# Patient Record
Sex: Male | Born: 1940 | Race: White | Hispanic: No | Marital: Married | Smoking: Former smoker
Health system: Southern US, Community
[De-identification: ages and names within clinical notes are randomized; demographics above are authoritative.]

## PROBLEM LIST (undated history)

## (undated) DIAGNOSIS — I5189 Other ill-defined heart diseases: Secondary | ICD-10-CM

## (undated) DIAGNOSIS — B269 Mumps without complication: Secondary | ICD-10-CM

## (undated) DIAGNOSIS — E559 Vitamin D deficiency, unspecified: Secondary | ICD-10-CM

## (undated) DIAGNOSIS — J189 Pneumonia, unspecified organism: Secondary | ICD-10-CM

## (undated) DIAGNOSIS — I2510 Atherosclerotic heart disease of native coronary artery without angina pectoris: Secondary | ICD-10-CM

## (undated) DIAGNOSIS — J9610 Chronic respiratory failure, unspecified whether with hypoxia or hypercapnia: Secondary | ICD-10-CM

## (undated) DIAGNOSIS — B019 Varicella without complication: Secondary | ICD-10-CM

## (undated) DIAGNOSIS — J309 Allergic rhinitis, unspecified: Secondary | ICD-10-CM

## (undated) DIAGNOSIS — N400 Enlarged prostate without lower urinary tract symptoms: Secondary | ICD-10-CM

## (undated) DIAGNOSIS — B059 Measles without complication: Secondary | ICD-10-CM

## (undated) DIAGNOSIS — J449 Chronic obstructive pulmonary disease, unspecified: Secondary | ICD-10-CM

## (undated) HISTORY — DX: Allergic rhinitis, unspecified: J30.9

## (undated) HISTORY — DX: Vitamin D deficiency, unspecified: E55.9

## (undated) HISTORY — PX: EYE SURGERY: SHX253

## (undated) HISTORY — DX: Varicella without complication: B01.9

## (undated) HISTORY — PX: APPENDECTOMY: SHX54

## (undated) HISTORY — DX: Measles without complication: B05.9

## (undated) HISTORY — DX: Mumps without complication: B26.9

## (undated) HISTORY — PX: TONSILLECTOMY: SUR1361

---

## 2000-10-06 ENCOUNTER — Emergency Department (HOSPITAL_COMMUNITY): Admission: EM | Admit: 2000-10-06 | Discharge: 2000-10-06 | Payer: Self-pay | Admitting: Emergency Medicine

## 2002-03-16 ENCOUNTER — Emergency Department (HOSPITAL_COMMUNITY): Admission: EM | Admit: 2002-03-16 | Discharge: 2002-03-17 | Payer: Self-pay | Admitting: Emergency Medicine

## 2006-04-11 ENCOUNTER — Other Ambulatory Visit: Payer: Self-pay

## 2006-04-11 ENCOUNTER — Emergency Department: Payer: Self-pay | Admitting: Internal Medicine

## 2006-09-14 ENCOUNTER — Other Ambulatory Visit: Payer: Self-pay

## 2006-09-14 ENCOUNTER — Inpatient Hospital Stay: Payer: Self-pay | Admitting: Internal Medicine

## 2008-09-18 ENCOUNTER — Inpatient Hospital Stay: Payer: Self-pay | Admitting: Internal Medicine

## 2011-01-13 ENCOUNTER — Ambulatory Visit: Payer: Self-pay | Admitting: Specialist

## 2011-02-04 ENCOUNTER — Ambulatory Visit: Payer: Self-pay | Admitting: Specialist

## 2012-04-18 DIAGNOSIS — N401 Enlarged prostate with lower urinary tract symptoms: Secondary | ICD-10-CM | POA: Insufficient documentation

## 2013-11-07 ENCOUNTER — Ambulatory Visit: Payer: Self-pay | Admitting: Specialist

## 2014-03-09 ENCOUNTER — Observation Stay: Payer: Self-pay | Admitting: Internal Medicine

## 2014-03-09 LAB — BASIC METABOLIC PANEL
ANION GAP: 3 — AB (ref 7–16)
BUN: 16 mg/dL (ref 7–18)
CHLORIDE: 99 mmol/L (ref 98–107)
CREATININE: 1 mg/dL (ref 0.60–1.30)
Calcium, Total: 8.9 mg/dL (ref 8.5–10.1)
Co2: 41 mmol/L (ref 21–32)
EGFR (African American): >60
EGFR (Non-African Amer.): >60
GLUCOSE: 99 mg/dL (ref 65–99)
Osmolality: 286 (ref 275–301)
Potassium: 4.5 mmol/L (ref 3.5–5.1)
Sodium: 143 mmol/L (ref 136–145)

## 2014-03-09 LAB — CBC
HCT: 41.4 % (ref 40.0–52.0)
HGB: 13.1 g/dL (ref 13.0–18.0)
MCH: 29.2 pg (ref 26.0–34.0)
MCHC: 31.7 g/dL — AB (ref 32.0–36.0)
MCV: 92 fL (ref 80–100)
PLATELETS: 160 10*3/uL (ref 150–440)
RBC: 4.49 10*6/uL (ref 4.40–5.90)
RDW: 15.9 % — ABNORMAL HIGH (ref 11.5–14.5)
WBC: 8.9 10*3/uL (ref 3.8–10.6)

## 2014-03-09 LAB — TROPONIN I: TROPONIN-I: 0.02 ng/mL

## 2014-03-10 LAB — URINALYSIS, COMPLETE
BILIRUBIN, UR: NEGATIVE
Bacteria: NONE SEEN
Blood: NEGATIVE
Glucose,UR: NEGATIVE mg/dL (ref 0–75)
Leukocyte Esterase: NEGATIVE
NITRITE: NEGATIVE
Ph: 6 (ref 4.5–8.0)
RBC,UR: 2 /HPF (ref 0–5)
Specific Gravity: 1.024 (ref 1.003–1.030)
WBC UR: 6 /HPF (ref 0–5)

## 2014-03-24 ENCOUNTER — Emergency Department: Payer: Self-pay | Admitting: Emergency Medicine

## 2014-10-18 NOTE — Discharge Summary (Signed)
PATIENT NAME:  Keith Watts, Keith Watts MR#:  [id number] DATE OF BIRTH:  Jun 29, 1940  DATE OF ADMISSION:  03/09/2014 DATE OF DISCHARGE:  03/11/2014  ADMITTING PHYSICIAN: Keith Athensavid K. Hower, MD.   DISCHARGING PHYSICIAN: Keith Baasadhika Selinda Korzeniewski, MD.   PRIMARY CARE PHYSICIAN: Keith IsaacsDonald E. Sherrie MustacheFisher, MD.   CONSULTATIONS IN HOSPITAL: None.   DISCHARGE DIAGNOSES: 1.  Recent falls.  2.  Left knee pain, likely tendinitis.  3.  Chronic obstructive pulmonary disease exacerbation with bronchitis.  4.  Chronic obstructive pulmonary disease on 3 liters home oxygen.  5.  Vitamin D deficiency.   DISCHARGE HOME MEDICATIONS:  1.  Spiriva inhalation capsule 2 daily.  2.  Advair 250/50 one puff b.i.d.  3.  Doxycycline 100 mg p.o. b.i.d.  4.  Prednisone taper over 6 days.  5.  Mucinex 600 mg p.o. b.i.d.  6.  Tramadol 50 mg p.o. every 6 hours p.r.n. for left knee pain.  7.  Vitamin D2 at 50,000 international units every week for 12 weeks.   DISCHARGE HOME OXYGEN: 3 liters.   DISCHARGE ACTIVITY: As tolerated.   DISCHARGE DIET: Regular diet.   FOLLOWUP INSTRUCTIONS: 1.  PCP followup in 1 to 2 weeks.  2.  Apply ice pack to left knee for pain; apply for 15 to 20 minutes each time at least 3 to 4 times a day. If does not improve in 1 to 2 weeks, discuss with PCP with an MRI of the knee.  3.  Home health physical therapy and nursing.   LABORATORY DATA PRIOR TO DISCHARGE: Sodium 143, potassium 4.5, chloride 99, bicarbonate 41, BUN 16, creatinine 1, glucose 99, calcium 8.9. WBC 8.9, hemoglobin 13.1, hematocrit 41.4, platelet count 160,000. Vitamin D level is extremely low at 8.7. Left knee x-ray showing evidence of acute abnormality. Chest x-ray showing severe chronic interstitial obstructive lung disease. No acute abnormality. CT of the head showing no acute intracranial abnormality, progressive chronic small vessel ischemic changes primarily in right hemisphere. Urinalysis negative for any infection.    BRIEF HOSPITAL COURSE: [name] is a 74 year old, elderly Caucasian gentleman with past medical history significant for COPD admitted to the hospital secondary to recent fall.  1.  Fall and weakness, likely secondary to vitamin D deficiency and overall weakness. Evaluated by physical therapy. Vitamin D level is extremely low. He will be supplemented with 50,000 units weekly for 12 weeks and follow with PCP. Physical therapy recommended home health.  2.  Left knee pain, likely tendinitis, recently been on prednisone a few weeks ago. Has significant pain of the knee. X-ray did not show any fracture. Pain is along the posterolateral tendon around the knee. Improved over the hospital, so advised ice pack and tramadol p.r.n. for pain. If pain worsens, might need an MRI as an outpatient.  3.  Chronic obstructive pulmonary disease with bronchitis. Given steroids in the hospital, being discharged on p.o. prednisone and also doxycycline. Inhalers were also given at the time of discharge.  4.  His course has been otherwise uneventful in the hospital.   DISCHARGE CONDITION: Stable.   DISCHARGE DISPOSITION: Home with home health.   TIME SPENT ON DISCHARGE: 40 minutes.     ____________________________ Keith Baasadhika Elio Haden, MD rk:at D: 03/11/2014 13:16:14 ET T: 03/11/2014 14:40:08 ET JOB#: [id number]  cc: Keith Baasadhika Eduin Friedel, MD, <Dictator> Keith Isaacsonald E. Sherrie MustacheFisher, MD Keith BaasADHIKA Shacoya Burkhammer MD ELECTRONICALLY SIGNED 03/27/2014 9:49

## 2014-10-18 NOTE — H&P (Signed)
PATIENT NAME:  Keith Watts, Keith Watts MR#:  [id number] DATE OF BIRTH:  31-Jan-1941  DATE OF ADMISSION:  03/09/2014  REFERRING PHYSICIAN: Loraine Leriche R. Fanny Bien, MD  PRIMARY CARE PHYSICIAN: Demetrios Isaacs. Sherrie Mustache, MD  CHIEF COMPLAINT: Falls.   HISTORY OF PRESENT ILLNESS: A 74 year old Caucasian male with a history of COPD on 2 L nasal cannula at baseline presenting with multiple falls. Recent diagnosis of pneumonia by PCP, had already finished antibiotics and steroids about 1 week ago, presenting with 3-4 falls within the last week duration without associated loss of consciousness. He did have 1 episode of head trauma when he fell and hit the side of the bathtub; however, once again, no loss of consciousness. Describes experiencing multiple falls over the past few days. One fall was mechanical in etiology; however, he is unsure why he fell the other 2-3 times. His wife at bedside states that whenever she attempts to have him ambulate, he fell backwards secondary to generalized weakness. No further symptomatology.  REVIEW OF SYSTEMS:  CONSTITUTIONAL: Denies fever. Positive for fatigue, weakness.  EYES: Denies blurred vision, double vision, eye pain. ENT: Denies tinnitus, ear pain, hearing loss.  RESPIRATORY: Denies cough, wheeze, shortness of breath.  CARDIOVASCULAR: Denies chest pain, palpitations, edema.  GASTROINTESTINAL: Denies nausea, vomiting, diarrhea, abdominal pain.  GENITOURINARY: Denies dysuria, hematuria.  ENDOCRINE: Denies nocturia or thyroid problems. HEMATOLOGY AND LYMPHATIC: Denies easy bruising and bleeding.  SKIN: Denies rashes or lesions.  MUSCULOSKELETAL: Denies pain in neck, back, shoulder, knees, hips, or arthritic symptoms.  NEUROLOGIC: Denies paralysis, paresthesias.  PSYCHIATRIC: Denies anxiety or depressive symptoms.  Otherwise, full review of systems performed by me is negative.   PAST MEDICAL HISTORY: COPD on 2 L nasal cannula at baseline.   SOCIAL HISTORY: Remote tobacco use. Denies  any alcohol or drug use.   FAMILY HISTORY: Positive for pancreatic cancer.   ALLERGIES: No known drug allergies.   HOME MEDICATIONS: Include Spiriva 18 mcg inhalation once daily, Advair 250/50 mcg b.i.d., oxygen 2 L nasal cannula at baseline.   PHYSICAL EXAMINATION:  VITAL SIGNS: Temperature 98.4, heart rate 84, respirations 16, blood pressure 146/77, . Weight 79.4 kg,  GENERAL: Weak-appearing Caucasian male currently in no acute distress.  HEAD: Normocephalic, atraumatic.  EYES: Pupils equal, round, reactive to light. Extraocular muscles intact. No scleral icterus. MOUTH: Moist mucous membranes. Dentition intact. No abscess noted. EARS, NOSE, THROAT: Clear without exudates. No external lesions.  NECK: Supple. No thyromegaly. No nodules. No JVD.  PULMONARY: Clear to auscultation bilaterally without wheezes, rales, or rhonchi. No use of accessory muscles. Good respiratory effort.  CHEST: Nontender to palpation.  CARDIOVASCULAR: S1, S2, regular rate and rhythm. No murmurs, rubs, or gallops. No edema. Pedal pulses 2+ bilaterally. GASTROINTESTINAL: Soft, nontender, nondistended. No masses. Positive bowel sounds. No hepatosplenomegaly.  MUSCULOSKELETAL: No swelling, clubbing, or edema. Range of motion full in all extremities.  NEUROLOGIC: Cranial nerves II through XII intact. No gross focal neurological deficits. Sensation intact. Reflexes intact. Strength 5/5 in all extremities, including proximal and distal flexion and extension.  SKIN: No ulceration, lesions, rash, cyanosis. Skin warm, dry. Turgor intact.  PSYCHIATRIC: Mood and affect within normal limits. Patient awake, alert, oriented x 3. Insight and judgment are intact.   LABORATORY DATA: Chest x-ray performed reveals stable COPD findings. No acute cardiopulmonary process. X-ray of the left knee: No abnormalities. CT, head, performed: No intracranial process. Remainder of laboratory data: Sodium 143, potassium 4.5, chloride 99, bicarbonate  41, BUN 16, creatinine 1,. WBC 8.9, hemoglobin 13.1, platelets  of 160,000.   ASSESSMENT AND PLAN: A 74 year old gentleman with a history of chronic obstructive pulmonary disease with 2 L nasal cannula at baseline presenting with multiple falls and general weakness. 1.  Weakness and falls, likely multifactorial. Get physical therapy evaluation. Will check a vitamin D level. Will also check a urinalysis, as this has yet to be performed.  2.  Chronic obstructive pulmonary disease. Stable. Continue with supplemental O2, Advair, and Spiriva. 3.  Venous thromboembolism prophylaxis with heparin subcutaneous.  CODE STATUS: Patient is full code.  TIME SPENT: 45 minutes.    ____________________________ Cletis Athensavid K. Gift Rueckert, MD dkh:ST D: 03/09/2014 22:46:55 ET T: 03/09/2014 23:18:51 ET JOB#: [id number]  cc: Cletis Athensavid K. Congetta Odriscoll, MD, <Dictator> Alexandrina Fiorini Synetta ShadowK Evren Shankland MD ELECTRONICALLY SIGNED 03/10/2014 20:51

## 2014-10-20 ENCOUNTER — Inpatient Hospital Stay
Admission: EM | Admit: 2014-10-20 | Discharge: 2014-10-26 | DRG: 871 | Disposition: A | Discharge status: Home / Self Care | Payer: PPO | Attending: Internal Medicine | Admitting: Internal Medicine

## 2014-10-20 DIAGNOSIS — Z79899 Other long term (current) drug therapy: Secondary | ICD-10-CM

## 2014-10-20 DIAGNOSIS — J9621 Acute and chronic respiratory failure with hypoxia: Secondary | ICD-10-CM | POA: Diagnosis present

## 2014-10-20 DIAGNOSIS — J9622 Acute and chronic respiratory failure with hypercapnia: Secondary | ICD-10-CM | POA: Diagnosis present

## 2014-10-20 DIAGNOSIS — R7989 Other specified abnormal findings of blood chemistry: Secondary | ICD-10-CM | POA: Diagnosis not present

## 2014-10-20 DIAGNOSIS — I480 Paroxysmal atrial fibrillation: Secondary | ICD-10-CM | POA: Diagnosis present

## 2014-10-20 DIAGNOSIS — J189 Pneumonia, unspecified organism: Secondary | ICD-10-CM | POA: Diagnosis present

## 2014-10-20 DIAGNOSIS — J449 Chronic obstructive pulmonary disease, unspecified: Secondary | ICD-10-CM | POA: Diagnosis present

## 2014-10-20 DIAGNOSIS — J441 Chronic obstructive pulmonary disease with (acute) exacerbation: Secondary | ICD-10-CM | POA: Diagnosis present

## 2014-10-20 DIAGNOSIS — I2510 Atherosclerotic heart disease of native coronary artery without angina pectoris: Secondary | ICD-10-CM | POA: Diagnosis present

## 2014-10-20 DIAGNOSIS — Z9981 Dependence on supplemental oxygen: Secondary | ICD-10-CM

## 2014-10-20 DIAGNOSIS — N179 Acute kidney failure, unspecified: Secondary | ICD-10-CM | POA: Diagnosis not present

## 2014-10-20 DIAGNOSIS — I509 Heart failure, unspecified: Secondary | ICD-10-CM | POA: Diagnosis not present

## 2014-10-20 DIAGNOSIS — G9341 Metabolic encephalopathy: Secondary | ICD-10-CM | POA: Diagnosis present

## 2014-10-20 DIAGNOSIS — J9811 Atelectasis: Secondary | ICD-10-CM | POA: Diagnosis present

## 2014-10-20 DIAGNOSIS — N170 Acute kidney failure with tubular necrosis: Secondary | ICD-10-CM | POA: Diagnosis present

## 2014-10-20 DIAGNOSIS — N400 Enlarged prostate without lower urinary tract symptoms: Secondary | ICD-10-CM | POA: Diagnosis present

## 2014-10-20 DIAGNOSIS — I340 Nonrheumatic mitral (valve) insufficiency: Secondary | ICD-10-CM | POA: Diagnosis not present

## 2014-10-20 DIAGNOSIS — Z7951 Long term (current) use of inhaled steroids: Secondary | ICD-10-CM

## 2014-10-20 DIAGNOSIS — A419 Sepsis, unspecified organism: Principal | ICD-10-CM | POA: Diagnosis present

## 2014-10-20 LAB — URINALYSIS, COMPLETE
BACTERIA: NONE SEEN
Bilirubin,UR: NEGATIVE
GLUCOSE, UR: NEGATIVE mg/dL (ref 0–75)
LEUKOCYTE ESTERASE: NEGATIVE
NITRITE: NEGATIVE
Ph: 5 (ref 4.5–8.0)
Protein: 100
Specific Gravity: 1.023 (ref 1.003–1.030)

## 2014-10-20 LAB — COMPREHENSIVE METABOLIC PANEL
ALBUMIN: 3.3 g/dL — AB
ALT: 29 U/L
Alkaline Phosphatase: 125 U/L
Anion Gap: 11 (ref 7–16)
BILIRUBIN TOTAL: 0.2 mg/dL — AB
BUN: 34 mg/dL — ABNORMAL HIGH
CHLORIDE: 103 mmol/L
CO2: 27 mmol/L
CREATININE: 1.65 mg/dL — AB
Calcium, Total: 8.5 mg/dL — ABNORMAL LOW
EGFR (African American): 47 — ABNORMAL LOW
GFR CALC NON AF AMER: 41 — AB
Glucose: 215 mg/dL — ABNORMAL HIGH
POTASSIUM: 4.5 mmol/L
SGOT(AST): 37 U/L
Sodium: 141 mmol/L
Total Protein: 7.8 g/dL

## 2014-10-20 LAB — APTT: Activated PTT: 37.1 secs — ABNORMAL HIGH (ref 23.6–35.9)

## 2014-10-20 LAB — CBC WITH DIFFERENTIAL/PLATELET
Basophil #: 0 10*3/uL (ref 0.0–0.1)
Basophil %: 0.2 %
Eosinophil #: 0 10*3/uL (ref 0.0–0.7)
Eosinophil %: 0.1 %
HCT: 39.7 % — AB (ref 40.0–52.0)
HGB: 12.6 g/dL — AB (ref 13.0–18.0)
Lymphocyte #: 0.7 10*3/uL — ABNORMAL LOW (ref 1.0–3.6)
Lymphocyte %: 5.6 %
MCH: 29 pg (ref 26.0–34.0)
MCHC: 31.8 g/dL — ABNORMAL LOW (ref 32.0–36.0)
MCV: 91 fL (ref 80–100)
MONO ABS: 1.1 x10 3/mm — AB (ref 0.2–1.0)
MONOS PCT: 9 %
Neutrophil #: 10.7 10*3/uL — ABNORMAL HIGH (ref 1.4–6.5)
Neutrophil %: 85.1 %
Platelet: 196 10*3/uL (ref 150–440)
RBC: 4.35 10*6/uL — ABNORMAL LOW (ref 4.40–5.90)
RDW: 14.5 % (ref 11.5–14.5)
WBC: 12.6 10*3/uL — ABNORMAL HIGH (ref 3.8–10.6)

## 2014-10-20 LAB — TROPONIN I: Troponin-I: 0.11 ng/mL — ABNORMAL HIGH

## 2014-10-20 LAB — LACTIC ACID, PLASMA: Lactic Acid, Venous: 2.6 mmol/L

## 2014-10-20 LAB — PROTIME-INR
INR: 1.2
Prothrombin Time: 15.1 secs — ABNORMAL HIGH

## 2014-10-21 ENCOUNTER — Other Ambulatory Visit: Payer: Self-pay | Admitting: Physician Assistant

## 2014-10-21 DIAGNOSIS — J9621 Acute and chronic respiratory failure with hypoxia: Secondary | ICD-10-CM

## 2014-10-21 DIAGNOSIS — R7989 Other specified abnormal findings of blood chemistry: Secondary | ICD-10-CM

## 2014-10-21 DIAGNOSIS — I340 Nonrheumatic mitral (valve) insufficiency: Secondary | ICD-10-CM

## 2014-10-21 DIAGNOSIS — A419 Sepsis, unspecified organism: Secondary | ICD-10-CM

## 2014-10-21 DIAGNOSIS — N179 Acute kidney failure, unspecified: Secondary | ICD-10-CM

## 2014-10-21 LAB — CK TOTAL AND CKMB (NOT AT ARMC)
CK, Total: 45 U/L — ABNORMAL LOW
CK, Total: 49 U/L
CK-MB: 3.8 ng/mL
CK-MB: 4.2 ng/mL
CK-MB: 4.6 ng/mL

## 2014-10-21 LAB — BASIC METABOLIC PANEL
Anion Gap: 12 (ref 7–16)
Anion Gap: 8 (ref 7–16)
BUN: 31 mg/dL — ABNORMAL HIGH
BUN: 29 mg/dL — AB
CALCIUM: 7.6 mg/dL — AB
CREATININE: 1.4 mg/dL — AB
Calcium, Total: 8 mg/dL — ABNORMAL LOW
Chloride: 112 mmol/L — ABNORMAL HIGH
Chloride: 112 mmol/L — ABNORMAL HIGH
Co2: 20 mmol/L — ABNORMAL LOW
Co2: 24 mmol/L
Creatinine: 1.34 mg/dL — ABNORMAL HIGH
EGFR (African American): 57 — ABNORMAL LOW
EGFR (African American): >60
EGFR (Non-African Amer.): 49 — ABNORMAL LOW
EGFR (Non-African Amer.): 52 — ABNORMAL LOW
GLUCOSE: 169 mg/dL — AB
Glucose: 176 mg/dL — ABNORMAL HIGH
Potassium: 4.7 mmol/L
Potassium: 4.9 mmol/L
Sodium: 144 mmol/L
Sodium: 144 mmol/L

## 2014-10-21 LAB — CBC WITH DIFFERENTIAL/PLATELET
BASOS PCT: 0.1 %
Basophil #: 0 10*3/uL (ref 0.0–0.1)
EOS ABS: 0 10*3/uL (ref 0.0–0.7)
Eosinophil %: 0.1 %
HCT: 35.8 % — ABNORMAL LOW (ref 40.0–52.0)
HGB: 11.3 g/dL — AB (ref 13.0–18.0)
LYMPHS ABS: 0.4 10*3/uL — AB (ref 1.0–3.6)
Lymphocyte %: 4.6 %
MCH: 29.4 pg (ref 26.0–34.0)
MCHC: 31.7 g/dL — AB (ref 32.0–36.0)
MCV: 93 fL (ref 80–100)
Monocyte #: 0.3 x10 3/mm (ref 0.2–1.0)
Monocyte %: 2.9 %
NEUTROS ABS: 8.6 10*3/uL — AB (ref 1.4–6.5)
Neutrophil %: 92.3 %
Platelet: 138 10*3/uL — ABNORMAL LOW (ref 150–440)
RBC: 3.85 10*6/uL — ABNORMAL LOW (ref 4.40–5.90)
RDW: 14.7 % — ABNORMAL HIGH (ref 11.5–14.5)
WBC: 9.3 10*3/uL (ref 3.8–10.6)

## 2014-10-21 LAB — MAGNESIUM: Magnesium: 2.2 mg/dL

## 2014-10-21 LAB — TROPONIN I
Troponin-I: 0.14 ng/mL — ABNORMAL HIGH
Troponin-I: 0.18 ng/mL — ABNORMAL HIGH

## 2014-10-21 LAB — PHOSPHORUS: Phosphorus: 3.2 mg/dL

## 2014-10-21 LAB — HEPARIN LEVEL (UNFRACTIONATED): Anti-Xa(Unfractionated): 0.18 IU/mL — ABNORMAL LOW (ref 0.30–0.70)

## 2014-10-22 DIAGNOSIS — I509 Heart failure, unspecified: Secondary | ICD-10-CM

## 2014-10-22 LAB — CULTURE, BLOOD (SINGLE)

## 2014-10-23 LAB — BASIC METABOLIC PANEL
Anion Gap: 6 — ABNORMAL LOW (ref 7–16)
BUN: 25 mg/dL — ABNORMAL HIGH
CHLORIDE: 111 mmol/L
CREATININE: 0.99 mg/dL
Calcium, Total: 8.4 mg/dL — ABNORMAL LOW
Co2: 29 mmol/L
EGFR (African American): >60
EGFR (Non-African Amer.): >60
Glucose: 108 mg/dL — ABNORMAL HIGH
Potassium: 3.8 mmol/L
SODIUM: 146 mmol/L — AB

## 2014-10-24 LAB — BASIC METABOLIC PANEL
Anion Gap: 8 (ref 7–16)
BUN: 23 mg/dL — ABNORMAL HIGH
CALCIUM: 8.5 mg/dL — AB
CHLORIDE: 102 mmol/L
CO2: 33 mmol/L — AB
CREATININE: 1.03 mg/dL
GLUCOSE: 181 mg/dL — AB
Potassium: 4.1 mmol/L
SODIUM: 143 mmol/L

## 2014-10-24 LAB — CBC WITH DIFFERENTIAL/PLATELET
BANDS NEUTROPHIL: 1 %
COMMENT - H1-COM2: NORMAL
HCT: 35.1 % — ABNORMAL LOW (ref 40.0–52.0)
HGB: 11.5 g/dL — AB (ref 13.0–18.0)
LYMPHS PCT: 10 %
MCH: 29.5 pg (ref 26.0–34.0)
MCHC: 32.9 g/dL (ref 32.0–36.0)
MCV: 90 fL (ref 80–100)
METAMYELOCYTE: 1 %
Monocytes: 1 %
Myelocyte: 2 %
Platelet: 191 10*3/uL (ref 150–440)
RBC: 3.91 10*6/uL — AB (ref 4.40–5.90)
RDW: 14.3 % (ref 11.5–14.5)
SEGMENTED NEUTROPHILS: 85 %
WBC: 9.1 10*3/uL (ref 3.8–10.6)

## 2014-10-24 LAB — LIPID PANEL
CHOLESTEROL: 154 mg/dL
HDL: 40 mg/dL
LDL CHOLESTEROL, CALC: 94 mg/dL
Triglycerides: 98 mg/dL
VLDL Cholesterol, Calc: 20 mg/dL

## 2014-10-25 DIAGNOSIS — A419 Sepsis, unspecified organism: Secondary | ICD-10-CM | POA: Diagnosis present

## 2014-10-25 MED ORDER — HEPARIN SODIUM (PORCINE) 5000 UNIT/ML IJ SOLN
5000.0000 [IU] | Freq: Three times a day (TID) | INTRAMUSCULAR | Status: DC
  Administered 2014-10-26: 5000 [IU] via SUBCUTANEOUS
  Filled 2014-10-25: qty 1

## 2014-10-25 MED ORDER — OXYCODONE-ACETAMINOPHEN 5-325 MG PO TABS: 1.0000 | ORAL_TABLET | Freq: Four times a day (QID) | ORAL | Status: DC | PRN

## 2014-10-25 MED ORDER — ALBUTEROL SULFATE (2.5 MG/3ML) 0.083% IN NEBU: 2.5000 mg | INHALATION_SOLUTION | RESPIRATORY_TRACT | Status: DC

## 2014-10-25 MED ORDER — ACETAMINOPHEN 325 MG PO TABS: 650.0000 mg | ORAL_TABLET | ORAL | Status: DC | PRN

## 2014-10-25 MED ORDER — TIOTROPIUM BROMIDE MONOHYDRATE 18 MCG IN CAPS
18.0000 ug | ORAL_CAPSULE | Freq: Every day | RESPIRATORY_TRACT | Status: DC
  Filled 2014-10-25 (×2): qty 5

## 2014-10-25 MED ORDER — FINASTERIDE 5 MG PO TABS: 5.0000 mg | ORAL_TABLET | Freq: Every day | ORAL | Status: DC

## 2014-10-25 MED ORDER — DILTIAZEM HCL 25 MG/5ML IV SOLN
10.0000 mg | Freq: Four times a day (QID) | INTRAVENOUS | Status: DC | PRN
  Filled 2014-10-25: qty 5

## 2014-10-25 MED ORDER — BUDESONIDE 0.5 MG/2ML IN SUSP
0.5000 mg | Freq: Two times a day (BID) | RESPIRATORY_TRACT | Status: DC
  Administered 2014-10-26: 0.5 mg via RESPIRATORY_TRACT
  Filled 2014-10-25: qty 2

## 2014-10-25 MED ORDER — INSULIN ASPART 100 UNIT/ML ~~LOC~~ SOLN: 0.0000 [IU] | Freq: Three times a day (TID) | SUBCUTANEOUS | Status: DC

## 2014-10-25 MED ORDER — MOMETASONE FURO-FORMOTEROL FUM 100-5 MCG/ACT IN AERO
2.0000 | INHALATION_SPRAY | Freq: Two times a day (BID) | RESPIRATORY_TRACT | Status: DC
  Administered 2014-10-26: 2 via RESPIRATORY_TRACT
  Filled 2014-10-25: qty 8.8

## 2014-10-25 MED ORDER — ALBUTEROL SULFATE (2.5 MG/3ML) 0.083% IN NEBU: 2.5000 mg | INHALATION_SOLUTION | Freq: Four times a day (QID) | RESPIRATORY_TRACT | Status: DC

## 2014-10-25 MED ORDER — SODIUM CHLORIDE 0.9 % IJ SOLN: 3.0000 mL | INTRAMUSCULAR | Status: DC | PRN

## 2014-10-25 MED ORDER — GUAIFENESIN ER 600 MG PO TB12
600.0000 mg | ORAL_TABLET | Freq: Two times a day (BID) | ORAL | Status: DC
  Administered 2014-10-26: 600 mg via ORAL
  Filled 2014-10-25: qty 1

## 2014-10-25 MED ORDER — ONDANSETRON HCL 4 MG/2ML IJ SOLN: 4.0000 mg | INTRAMUSCULAR | Status: DC | PRN

## 2014-10-25 MED ORDER — ASPIRIN 325 MG PO TABS: 325.0000 mg | ORAL_TABLET | Freq: Every day | ORAL | Status: DC

## 2014-10-25 MED ORDER — HYDROCODONE-ACETAMINOPHEN 5-325 MG PO TABS
1.0000 | ORAL_TABLET | Freq: Four times a day (QID) | ORAL | Status: DC | PRN
Start: 2014-10-26 — End: 2014-10-25

## 2014-10-25 MED ORDER — LEVOFLOXACIN 500 MG PO TABS
500.0000 mg | ORAL_TABLET | Freq: Every day | ORAL | Status: DC
  Administered 2014-10-26: 500 mg via ORAL
  Filled 2014-10-25: qty 1

## 2014-10-25 MED ORDER — METHYLPREDNISOLONE SODIUM SUCC 40 MG IJ SOLR: 40.0000 mg | Freq: Two times a day (BID) | INTRAMUSCULAR | Status: DC

## 2014-10-25 MED ORDER — DILTIAZEM HCL 60 MG PO TABS
60.0000 mg | ORAL_TABLET | Freq: Four times a day (QID) | ORAL | Status: DC
  Administered 2014-10-26: 60 mg via ORAL
  Filled 2014-10-25: qty 1

## 2014-10-26 LAB — GLUCOSE, CAPILLARY: Glucose-Capillary: 127 mg/dL — ABNORMAL HIGH (ref 70–99)

## 2014-10-26 MED ORDER — ALBUTEROL SULFATE (2.5 MG/3ML) 0.083% IN NEBU: 2.5000 mg | INHALATION_SOLUTION | Freq: Four times a day (QID) | RESPIRATORY_TRACT | Status: DC | PRN

## 2014-10-26 MED ORDER — DILTIAZEM HCL 60 MG PO TABS: 60.0000 mg | ORAL_TABLET | Freq: Four times a day (QID) | ORAL | Status: DC

## 2014-10-26 MED ORDER — ALBUTEROL SULFATE (2.5 MG/3ML) 0.083% IN NEBU
2.5000 mg | INHALATION_SOLUTION | Freq: Four times a day (QID) | RESPIRATORY_TRACT | Status: DC
  Administered 2014-10-26 (×2): 2.5 mg via RESPIRATORY_TRACT
  Filled 2014-10-26 (×2): qty 3

## 2014-10-26 MED ORDER — LEVOFLOXACIN 500 MG PO TABS: 500.0000 mg | ORAL_TABLET | Freq: Every day | ORAL | Status: AC

## 2014-10-26 MED ORDER — ASPIRIN 325 MG PO TABS: 325.0000 mg | ORAL_TABLET | Freq: Every day | ORAL | Status: DC

## 2014-10-26 MED ORDER — ALBUTEROL SULFATE (2.5 MG/3ML) 0.083% IN NEBU: 2.5000 mg | INHALATION_SOLUTION | RESPIRATORY_TRACT | Status: DC

## 2014-10-26 MED ORDER — FINASTERIDE 5 MG PO TABS: 5.0000 mg | ORAL_TABLET | Freq: Every day | ORAL | Status: DC

## 2014-10-26 MED ORDER — INSULIN ASPART 100 UNIT/ML ~~LOC~~ SOLN: 0.0000 [IU] | Freq: Three times a day (TID) | SUBCUTANEOUS | Status: DC

## 2014-10-26 MED ORDER — MOMETASONE FURO-FORMOTEROL FUM 100-5 MCG/ACT IN AERO: 2.0000 | INHALATION_SPRAY | Freq: Two times a day (BID) | RESPIRATORY_TRACT | Status: DC

## 2014-10-26 NOTE — H&P (Signed)
PATIENT NAME:  Keith Watts, Keith Watts MR#:  [id number] DATE OF BIRTH:  Mar 11, 1941  DATE OF ADMISSION:  10/20/2014  REFERRING PHYSICIAN: Richardean Canalavid Watts. Yao, MD  PRIMARY CARE PHYSICIAN: Demetrios Isaacsonald E. Sherrie MustacheFisher, MD  PULMONOLOGY: Clenton PareHerbon E. Meredeth IdeFleming, MD  CHIEF COMPLAINT: Shortness of breath.  HISTORY OF PRESENT ILLNESS: A 74 year old Caucasian male with a history of COPD, chronic respiratory failure, 3 L nasal cannula at baseline, presenting with shortness of breath and altered mental status. The patient unable to provide meaningful information given mental status and medical condition. History obtained from daughter and wife present at bedside. They describe the patient with fevers, chills, 2- to 3-day total duration of cough, which is increased somewhat in frequency, productive of whitish sputum which has been unchanged. However, 1-day duration of an altered mental status, mainly lethargy. Given his confusion and increased work of breathing, decided to present to the hospital for further workup and evaluation. In the Emergency Department, noted to be increased work of breathing with respiratory rates in the high 30s, low 40s; heart rates in the 140s as well. Promptly placed on BiPAP therapy with some improvement.  REVIEW OF SYSTEMS: Unable to obtain given the patient's mental status and medical condition.   PAST MEDICAL HISTORY: COPD, 3 L nasal cannula at baseline; COPD with chronic respiratory failure, 3 L nasal cannula at baseline; as well as BPH.   SOCIAL HISTORY: Remote tobacco use. No alcohol or drug usage.   FAMILY HISTORY: No known cardiovascular or pulmonary disorders.   ALLERGIES: No known drug allergies.   HOME MEDICATIONS: Include finasteride 5 mg p.o. daily, albuterol 3 mL inhalation q.6 hours as needed for shortness of breath, Advair 250/50 mcg inhalation 2 puffs b.i.d.   PHYSICAL EXAMINATION:  VITAL SIGNS: Temperature 101.6 degrees Fahrenheit, heart rate 147, respirations 39, blood pressure 114/65,  saturating 100% on BiPAP therapy. Weight 67.6 kg, BMI 25.6.  GENERAL: Critically ill-appearing Caucasian gentleman in respiratory distress requiring BiPAP therapy.  HEAD: Normocephalic, atraumatic.  EYES: Pupils equal, round, reactive to light. Extraocular muscles intact. No scleral icterus.  MOUTH: Dry mucosal membrane. Dentition poor. No abscess noted.  EARS, NOSE, AND THROAT: Clear without exudates. No external lesions.  NECK: Supple. No thyromegaly. No nodules. No JVD.  PULMONARY: Expiratory wheezing heard in the left upper lobe with some scattered rhonchi. Tachypneic without use of accessory muscles. Poor respiratory effort. Currently requiring BiPAP therapy.  CHEST: Nontender to palpation.  CARDIOVASCULAR: S1, S2, tachycardic. No murmurs, rubs, or gallops. No edema. Pedal pulses 2+ bilaterally.  GASTROINTESTINAL: Soft, nontender, nondistended. No masses. Positive bowel sounds. No hepatosplenomegaly. MUSCULOSKELETAL: No swelling, clubbing, or edema. Passive range of motion full in all extremities. NEUROLOGIC: Unable to fully assess given the patient's mental status and medical condition. Unable to follow simple commands or answer questions at this time. SKIN: No ulceration, lesion, rash, cyanosis. Skin warm and dry. Turgor intact.  PSYCHIATRIC: Unable to fully assess, given patient's mental status and medical condition, as he is unable to follow simple commands or answer any questions. He does respond to painful stimuli; however, promptly falls back asleep.   LABORATORY DATA: Sodium 141, potassium 4.5, chloride 103, bicarbonate 27, BUN 34, creatinine 1.65, glucose 215, lactic acid of 2.6. LFTs: Albumin 3.3, bilirubin 0.2. Troponin 0.11. WBC 12.6, hemoglobin 12.6, platelets of 196,000. Urinalysis: Negative for evidence of infection. ABG performed: pH 7.12, CO2 of 71, oxygen of 121, bicarbonate of 23 on BiPAP 10/5.  IMAGING: Chest x-ray performed of lungs. Hyperexpansion of the lungs with  bibasilar atelectasis versus scarring, right greater than left.   ASSESSMENT AND PLAN: A 74 year old Caucasian gentleman with a history of chronic obstructive pulmonary disease, chronic respiratory failure on 3 L nasal cannula at baseline, presenting with shortness of breath.  1.  Sepsis, severe. Meets septic criteria by temperature, heart rate, respiratory rate present on arrival, as well as severe lactic acidosis as well as acute kidney injury. Panculture blood, sputum,  urine. Intravenous fluid hydration. Received 30 mL/kg intravenous fluid bolus. Continue intravenous fluid hydration. Keep mean arterial pressure greater than 65. Broad-spectrum antibiotic coverage with vancomycin and Zosyn, as thistime for pulmonary sources. Taper antibiotics when culture data returns.  2.  Acute on chronic respiratory failure with hypoxia and hypercapnia. Placed on BiPAP. We will increase the delta P increase the IPAP to 18. Recheck ABG in 60 minutes. If no improvement, may require intubation. Continue with DuoNeb treatments q.4 hours. Supplemental oxygen to keep sao2 greater 88%. Steroids, Solu-Medrol 60 mg IV daily. 3.  Acute kidney injury. Intravenous fluid hydration. Follow urine output, renal function. 4.  Elevated troponin. Unknown if any actual cardiac symptoms. We will place on telemetry, initiate aspirin and statin therapy as well as heparin drip, which was started by the Emergency Department staff continue.  5.  Venous thromboembolism prophylaxis with heparin drip.  CODE STATUS: The patient is full code.   CRITICAL CARE TIME SPENT: 55 minutes.    ____________________________ Cletis Athens. Barbara Ahart, MD dkh:ST D: 10/21/2014 01:39:02 ET T: 10/21/2014 02:16:16 ET JOB#: [id number]  cc: Cletis Athens. Kassiah Mccrory, MD, <Dictator> William Laske Synetta Shadow MD ELECTRONICALLY SIGNED 10/23/2014 10:45

## 2014-10-26 NOTE — Discharge Instructions (Signed)
°  Please take all medication as directed.  Please keep your follow up appointment.  Please call your Primary Care Physician with any questions or concerns.

## 2014-10-26 NOTE — Discharge Summary (Signed)
Physician Discharge Summary  Patient ID: Keith Watts MRN: [id number] DOB/AGE: [id number] 74 y.o.  Admit date: 10/20/2014 Discharge date: 10/26/2014  Admission Diagnoses:1.Acute  On chronic respiratory failure due to copdexacerbation 2..bilateral pneumonia 3.Sepsis due to pneumonia; 4.Acute renal failure/ATN;resolved 5.metabolic enephalpathy;due to 1,2.3;resolved 6.paroxysmal Afib; 7severe COPD  Discharge Diagnoses:  Active Problems:   Sepsis   Discharged Condition: stable  Hospital Course: A 74 year old Caucasian male with a history of COPD, chronic respiratory failure, 3 L nasal cannula at baseline, presenting with shortness of breath and altered mental status. Admitted for sepsis due to pneumonia/acute on chronic respiratory failure.intially was on BiPAP,started on Levaquin,steroids;seen by DR.Meredeth IdeFleming.He also received IV fluids.his symptoms improved and now he is on litre o2,sats above 90%,Plan  to d/c with tapering steroids,levaquin,and Dr.Fleming will see him as an out pt.  Consults: pulmonary/intensive care  Significant Diagnostic Studies: wbc 11.4 on admission,bun34,cr.1.6 on admission. cardiac graphics: ECG: showed Sinus tachy with 140 bpm on admision* ABG Ph;7.120,pco2;71,po2121 Blood cultures are negative Echo ;Ef more than 50% with diastolic dysfunction  Treatments: IV hydration, antibiotics: Levaquin, steroids: solu-medrol and respiratory therapy: O2 and albuterol/atropine nebulizer  Discharge Exam: Blood pressure 133/67, pulse 78, temperature 98.5 F (36.9 C), temperature source Oral, height 5' 3.98" (1.625 m), weight 67.6 kg (149 lb 0.5 oz), SpO2 96 %. General appearance: alert Head: Normocephalic, without obvious abnormality, atraumatic Eyes: conjunctivae/corneas clear. PERRL, EOM's intact. Fundi benign. Resp: wheezes bibasilar Cardio: regular rate and rhythm, S1, S2 normal, no murmur, click, rub or gallop  Disposition: hone with home health Discharge  med's;include; ASA;325 mg po daily 2.cardizem 60 mg po every 6 hrs levaquin 500 mg po daily for 5 days prednisone 40 mg po daily for 2 days 30 mg po dally for 2 days 20 mg po daily for 2 days    Medication List    ASK your doctor about these medications        albuterol (2.5 MG/3ML) 0.083% nebulizer solution  Commonly known as:  PROVENTIL  Take 2.5 mg by nebulization every 6 (six) hours as needed for wheezing or shortness of breath.     finasteride 5 MG tablet  Commonly known as:  PROSCAR  Take 5 mg by mouth at bedtime.     Fluticasone-Salmeterol 250-50 MCG/DOSE Aepb  Commonly known as:  ADVAIR  Inhale 1 puff into the lungs 2 (two) times daily.         SignedKatha Hamming: [name] 10/26/2014, 7:21 AM

## 2014-10-26 NOTE — Progress Notes (Signed)
[facility] [id number] Matilde Bashhomas,Dionicio Shelnutt M, RN

## 2014-10-26 NOTE — Consult Note (Signed)
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 _____________  PMH: 1. 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 COPD  3. Chronic respiratory failure on 3L nasal cannula at baseline 4. BPH ______________   Present Illness 75 year old male with the above problem list who presented to [facility] on 4/26 with the above CC. He has known single vessel CAD by cardiac cath in 08/2008. At that time he presented to [facility] with complaint of respiratory failure. He refused nuclear stress test and underwent cardiac cath 09/24/2008 that showed mildly to moderately elevated pulmonary capillary wedge pressure. There was mild pulmonary hypertension. Global left ventricular function was normal, ejection fraction greater than 55%. Aorta showed small aneurysm formation. Consider PCI of LCx if he continued to be symptomatic. There was significant single vessel coronary artery disease. In the mid right common femoral there was 60% stenosis. He has not had any further ischemic evaluations since.   He presented to [facility] on 4/26 with a 10 day history of increased dyspnea, worse over the past 2-3 days. He also noted some increased LEE. No chest pain. Baseline weight 155, admission weight 149. No orthopnea. He notes a cough that is productive of yellow sputum. He has had fever and chills. On 4/26 he developed acute onset of altered mental status prompting his family to bring him in for evaluation. In the ED he was found to have a respiratory rate in the 30s-40s. He was placed on BiPAP. He was also noted to be tachycardic in the 140s. BP stable. Pancultures were obtained. Labs showed SCr 165-->1.40-->1.34, troponin 0.11-->0.18-->0.14, lactic acid 2.6, WBC count  12.6-->9.3, blood cultures NGTD,  CXR showed lung hyperexpansion and bibasilar atelectasis/scar, right greater than left, without definite superimposed acute cardiopulmonary disease. He was started on azithromycin and Zosyn. He remains on BiPAP.   Physical Exam:  GEN thin, critically ill appearing, frail   HEENT pale conjunctivae, PERRL, hearing intact to voice, dry oral mucosa   NECK supple  trachea midline  no JVD   RESP postive use of accessory muscles  rhonchi  on BiPAP   CARD Tachycardic  No murmur   ABD denies tenderness  soft   LYMPH negative neck   EXTR trace non-pitting edema bilaterally to the mid shins   SKIN normal to palpation   NEURO motor/sensory function intact   PSYCH agitated   Review of Systems:  ROS Pt not able to provide ROS  on BiPAP   Medications/Allergies Reviewed Medications/Allergies reviewed   Family & Social History:  Family and Social History:  Family History Coronary Artery Disease  Hypertension   Social History negative ETOH, negative Illicit drugs   + Tobacco Prior (greater than 1 year)  quit in 2008, smoke 3 ppd x 20 years   Place of Living Home     HOH=uses hearing aids:    Tobacco Use:    COPD:    Cataract Extraction:    Appendectomy:   Home Medications: Medication Instructions Status  Advair Diskus 250 mcg-50 mcg inhalation powder 1 puff(s) inhaled 2 times a day Active  finasteride 5 mg oral tablet 1 tab(s) orally once a day (at bedtime) Active  albuterol 2.5 mg/3 mL (0.083%) inhalation solution 3 milliliter(s)  inhaled every 6 hours, As Needed - for Shortness of Breath Active   Lab Results:  Routine Micro:  25-Apr-16 20:23   Micro Text Report BLOOD CULTURE   COMMENT                   NO GROWTH IN 8-12 HOURS   ANTIBIOTIC                       Micro Text Report BLOOD CULTURE   COMMENT                   NO GROWTH IN 8-12 HOURS   ANTIBIOTIC                       Culture Comment NO GROWTH IN 8-12 HOURS  Result(s)  reported on 21 Oct 2014 at 04:00AM.  Culture Comment NO GROWTH IN 8-12 HOURS  Result(s) reported on 21 Oct 2014 at 04:00AM.  Lab:  26-Apr-16 11:00   pH (ABG)  7.200 (7.350-7.450 NOTE: New Reference Range 01/18/14)  PCO2  65 (32-48 NOTE: New Reference Range 02/04/14)  PO2 98 (83-108 NOTE: New Reference Range 01/18/14)  FiO2 36  Base Excess  -4 (-3-3 NOTE: New Reference Range 02/04/14)  HCO3 25.4 (21.0-28.0 NOTE: New Reference Range 01/18/14)  O2 Saturation 98.6  O2 Device   Specimen Site (ABG) RT RADIAL  Specimen Type (ABG) ARTERIAL  Patient Temp (ABG) 37.0 (Result(s) reported on 21 Oct 2014 at 11:09AM.)  Routine Chem:  25-Apr-16 20:23   BUN  34 (6-20 NOTE: New Reference Range  09/02/14)  Creatinine (comp)  1.65 (0.61-1.24 NOTE: New Reference Range  09/02/14)  Potassium, Serum 4.5 (3.5-5.1 NOTE: New Reference Range  09/02/14)  26-Apr-16 06:53   BUN  31 (6-20 NOTE: New Reference Range  09/02/14)  Creatinine (comp)  1.40 (0.61-1.24 NOTE: New Reference Range  09/02/14)  Potassium, Serum 4.7 (3.5-5.1 NOTE: New Reference Range  09/02/14)    08:16   Glucose, Serum  169 (65-99 NOTE: New Reference Range  09/02/14)  BUN  29 (6-20 NOTE: New Reference Range  09/02/14)  Creatinine (comp)  1.34 (0.61-1.24 NOTE: New Reference Range  09/02/14)  Sodium, Serum 144 (135-145 NOTE: New Reference Range  09/02/14)  Potassium, Serum 4.9 (3.5-5.1 NOTE: New Reference Range  09/02/14)  Chloride, Serum  112 (101-111 NOTE: New Reference Range  09/02/14)  CO2, Serum 24 (22-32 NOTE: New Reference Range  09/02/14)  Calcium (Total), Serum  7.6 (8.9-10.3 NOTE: New Reference Range  09/02/14)  Anion Gap 8  eGFR (African American) >60  eGFR (Non-African American)  52 (eGFR values <52m/min/1.73 m2 may be an indication of chronic kidney disease (CKD). Calculated eGFR is useful in patients with stable renal function. The eGFR calculation will not be reliable in acutely ill  patients when serum creatinine is changing rapidly. It is not useful in patients on dialysis. The eGFR calculation may not be applicable to patients at the low and high extremes of body sizes, pregnant women, and vegetarians.)  Magnesium, Serum 2.2 (1.7-2.4 THERAPEUTIC RANGE: 4-7 mg/dL TOXIC: > 10 mg/dL  ----------------------- NOTE: New Reference Range  09/02/14)  Phosphorus, Serum 3.2 (2.5-4.6 NOTE: New Reference Range  09/02/14)  Cardiac:  25-Apr-16 20:23   Troponin I  0.11 (0.00-0.03 0.03 ng/mL or less: NEGATIVE  Repeat testing in 3-6 hrs  if clinically indicated. >0.05 ng/mL: POTENTIAL  MYOCARDIAL INJURY. Repeat  testing in 3-6 hrs if  clinically indicated. NOTE:  An increase or decrease  of 30% or more on serial  testing suggests a  clinically important change NOTE: New Reference Range  09/02/14)    20:32   CPK-MB, Serum 4.6 (0.5-5.0 NOTE: New Reference Range  09/02/14)  26-Apr-16 00:14   Troponin I  0.18 (0.00-0.03 0.03 ng/mL or less: NEGATIVE  Repeat testing in 3-6 hrs  if clinically indicated. >0.05 ng/mL: POTENTIAL  MYOCARDIAL INJURY. Repeat  testing in 3-6 hrs if  clinically indicated. NOTE: An increase or decrease  of 30% or more on serial  testing suggests a  clinically important change NOTE: New Reference Range  09/02/14)  CPK-MB, Serum 4.2 (0.5-5.0 NOTE: New Reference Range  09/02/14)    06:53   Troponin I  0.14 (0.00-0.03 0.03 ng/mL or less: NEGATIVE  Repeat testing in 3-6 hrs  if clinically indicated. >0.05 ng/mL: POTENTIAL  MYOCARDIAL INJURY. Repeat  testing in 3-6 hrs if  clinically indicated. NOTE: An increase or decrease  of 30% or more on serial  testing suggests a  clinically important change NOTE: New Reference Range  09/02/14)  CPK-MB, Serum 3.8 (0.5-5.0 NOTE: New Reference Range  09/02/14)  Routine UA:  25-Apr-16 21:51   Color (UA) Amber  Clarity (UA) Hazy  Glucose (UA) Negative  Bilirubin (UA) Negative  Ketones (UA)  Trace  Specific Gravity (UA) 1.023  Blood (UA) 1+  pH (UA) 5.0  Protein (UA) 100 mg/dL  Nitrite (UA) Negative  Leukocyte Esterase (UA) Negative (Result(s) reported on 20 Oct 2014 at 10:40PM.)  RBC (UA) TNTC  WBC (UA) 0-5  Bacteria (UA) NONE SEEN  Epithelial Cells (UA) 0-5  Mucous (UA) PRESENT  Hyaline Cast (UA) PRESENT (Result(s) reported on 20 Oct 2014 at 10:40PM.)  Routine Hem:  25-Apr-16 20:23   WBC (CBC)  12.6  RBC (CBC)  4.35  Hemoglobin (CBC)  12.6  Hematocrit (CBC)  39.7  Platelet Count (CBC) 196  26-Apr-16 06:53   WBC (CBC) 9.3  RBC (CBC)  3.85  Hemoglobin (CBC)  11.3  Hematocrit (CBC)  35.8  Platelet Count (CBC)  138   EKG:  EKG Interp. by me   Interpretation EKG shows sinus tachycardia, 154 bpm, occasional PVC, left axis deviation, 1 mm lateral flat st depression   Radiology Results:  XRay:    25-Apr-16 20:40, Chest Portable Single View  Chest Portable Single View   REASON FOR EXAM:    Sepsis  COMMENTS:       PROCEDURE: DXR - DXR PORTABLE CHEST SINGLE VIEW  - Oct 20 2014  8:40PM     CLINICAL DATA:  Week.  Shortness of breath.  History of COPD.    EXAM:  PORTABLE CHEST - 1 VIEW    COMPARISON:  03/09/2014; 09/18/2008; chest CT - 11/07/2013    FINDINGS:  Grossly unchanged cardiac silhouette and mediastinal contours. The  lungs remain hyperexpanded with flattening of the bilateral  diaphragms and thinning of the biapical pulmonary parenchyma.  Grossly unchanged bibasilar heterogeneous slightly nodular  opacities, right greater than left, similar to multiple remote prior  examinations and favored to represent atelectasis or scar. No new  discrete focal airspace opacities. No pleural effusion or  pneumothorax.No definite evidence of edema. No acute osseus  abnormalities.     IMPRESSION:  Similar findings of lung hyperexpansion and bibasilar atelectasis /  scar, right greater than left, without definite superimposed acute  cardiopulmonary disease on  this AP portable examination. Further  evaluation with a PA and lateral chest radiograph may be  obtained as  clinically indicated.  Electronically Signed    By: Sandi Mariscal M.D.    On: 10/20/2014 21:15         Verified By: Aileen Fass, M.D.,  Cardiology:    26-Apr-16 16:17, Echo Doppler  Echo Doppler   REASON FOR EXAM:      COMMENTS:       PROCEDURE: [facility] - ECHO DOPPLER COMPLETE(TRANSTHOR)  - Oct 21 2014  4:17PM     RESULT: Echocardiogram Report    Patient Name:   MAZEN MARCIN Date of Exam: 10/21/2014  Medical Rec #:  [id number]             Custom1:  Date of Birth:  1940/08/01          Height:       64.0 in  Patient Age:    12 years           Weight:       149.0 lb  Patient Gender: M                  BSA:          1.73 m??    Indications: Respiratory Failure  Sonographer:    Arville Go RDCS  Referring Phys: Valentino Nose, K    Sonographer Comments: Technically very difficult study due to very poor   echo windows.    Summary:   1. Left ventricular ejection fraction, by visual estimation, is 50 to   55%.   2. Low normal global left ventricular systolic function.   3. Impaired relaxation pattern of LV diastolic filling.   4. Normal right ventricular size and systolic function.   5. Mildly dilated left atrium.   6. Mild mitral valve regurgitation.   7. Borderline to Mild aortic valve stenosis.   8. Normal RVSP  2D AND M-MODE MEASUREMENTS (normal ranges within parentheses):  Left Ventricle:          Normal  IVSd (2D):      1.00 cm (0.7-1.1)  LVPWd (2D):     0.92 cm (0.7-1.1) Aorta/LA:                  Normal  LVIDd (2D):     5.01 cm (3.4-5.7) Aortic Root (2D): 3.00 cm (2.4-3.7)  LVIDs (2D):     4.28 cm           Left Atrium (2D): 4.00 cm (1.9-4.0)  LV FS (2D):     14.6 %   (>25%)  LV EF (2D):     30.8 %   (>50%)                                    Right Ventricle:                  RVd (2D):  LV SYSTOLIC FUNCTION BY 2D PLANIMETRY (MOD):  EF-A4C View: 94.7 %  LV  DIASTOLIC FUNCTION:  MV Peak E: 0.58 m/s Decel Time: 179 msec  MV Peak A: 1.01 m/s  E/A Ratio: 0.57  SPECTRAL DOPPLER ANALYSIS (where applicable):  Mitral Valve:  MV P1/2 Time: 51.91 msec  MV Area, PHT: 4.24 cm??  Aortic Valve: AoV Max Vel: 2.03 m/s AoV Peak PG: 16.5 mmHg AoV Mean PG:   10.0 mmHg  LVOT Vmax: 0.64 m/s LVOT VTI:  LVOT Diameter: 1.80 cm  AoV Area, Vmax: 0.80 cm?? AoV  Area, VTI:  AoV Area, Vmn:  Pulmonic Valve:  PV Max Velocity: 0.91 m/s PV Max PG: 3.3 mmHg PV Mean PG:    PHYSICIAN INTERPRETATION:  Left Ventricle: The left ventricular internal cavity size was normal. LV   posterior wall thickness was normal. No left ventricular hypertrophy.     Global LV systolic function was low normal. Left ventricular ejection   fraction, by visual estimation, is 50 to 55%. Spectral Doppler shows   impaired relaxation pattern of LV diastolic filling.  Right Ventricle: Normal right ventricular size, wall thickness, and   systolic function. The right ventricular size is normal. Global RV   systolic function is normal.  Left Atrium: The left atrium is mildly dilated.  Right Atrium: The right atrium is normal in size.  Pericardium: There is no evidence of pericardial effusion.  Mitral Valve: The mitral valve is normal in structure. Mild mitral valve   regurgitation is seen.  Tricuspid Valve: The tricuspid valve is normal. Trivial tricuspid   regurgitation is visualized.  Aortic Valve: The aortic valve was not well seen. Mild aortic stenosis is   present.  Pulmonic Valve: The pulmonic valve is normal. Trace pulmonic valve   regurgitation.  Aorta: The aortic root and ascending aorta are structurally normal, with   no evidence of dilitation.    [id number] Ida Rogue MD  Electronically signed by [id number] Ida Rogue MD  Signature Date/Time: 10/21/2014/6:38:51 PM    *** Final ***    IMPRESSION: .        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  1. Elevated troponin: -Mild elevated as above -Possibly supply demand ischemia in the setting of acute on chronic respiratory failure  EF close to normal -He reports not wanting to move forward with any invasive cardiac procedure should that be required (cath or surgery) should echo dictate (known 60% stenosis of LCx 08/2008 by cardiac cath as above) -He is agreeable to proceed with echo and medical management -Continue aspirin  -Check FLP  2. Acute on chronic respiratory failure with hypercarbia and hypoxia: -On BiPAP -Cultures pending -Echo pending -On azithromycin and Zosyn -Nebs per IM  3. Sepsis: -IV fluids -ABX as above -Cultures as above -Tachycardia, improved, compensatory  4. Acute kidney insufficiency: -Improving -Follow   Electronic Signatures: Rise Mu (PA-C)  (Signed 26-Apr-16 15:34)  Authored: General Aspect/Present Illness, History and Physical Exam, Review of System, Family & Social History, Past Medical History, Home Medications, Labs, EKG , Radiology, Allergies, Vital Signs/Nurse's Notes, Impression/Plan Ida Rogue (MD)  (Signed 26-Apr-16 20:07)  Authored: General Aspect/Present Illness, History and Physical Exam, Family & Social History, EKG , Radiology, Impression/Plan  Co-Signer: General Aspect/Present Illness, History and Physical Exam, Review of  System, Family & Social History, Past Medical History, Home Medications, Labs, EKG , Radiology, Allergies, Vital  Signs/Nurse's Notes, Impression/Plan   Last Updated: 26-Apr-16 20:07 by Ida Rogue (MD)

## 2014-11-13 LAB — VITAMIN D 25 HYDROXY (VIT D DEFICIENCY, FRACTURES): Vitamin D 1, 25 (OH)2 Total: 22.4

## 2014-11-26 ENCOUNTER — Encounter: Payer: Self-pay | Admitting: Emergency Medicine

## 2014-11-26 ENCOUNTER — Inpatient Hospital Stay (HOSPITAL_COMMUNITY)
Admit: 2014-11-26 | Discharge: 2014-11-26 | Disposition: A | Discharge status: Home / Self Care | Payer: PPO | Attending: Internal Medicine | Admitting: Internal Medicine

## 2014-11-26 ENCOUNTER — Inpatient Hospital Stay
Admission: EM | Admit: 2014-11-26 | Discharge: 2014-11-29 | DRG: 246 | Disposition: A | Discharge status: Home Health | Payer: PPO | Attending: Internal Medicine | Admitting: Internal Medicine

## 2014-11-26 ENCOUNTER — Emergency Department: Payer: PPO

## 2014-11-26 DIAGNOSIS — R7989 Other specified abnormal findings of blood chemistry: Secondary | ICD-10-CM | POA: Insufficient documentation

## 2014-11-26 DIAGNOSIS — J449 Chronic obstructive pulmonary disease, unspecified: Secondary | ICD-10-CM | POA: Diagnosis present

## 2014-11-26 DIAGNOSIS — N400 Enlarged prostate without lower urinary tract symptoms: Secondary | ICD-10-CM | POA: Diagnosis present

## 2014-11-26 DIAGNOSIS — I2510 Atherosclerotic heart disease of native coronary artery without angina pectoris: Secondary | ICD-10-CM | POA: Diagnosis present

## 2014-11-26 DIAGNOSIS — J189 Pneumonia, unspecified organism: Secondary | ICD-10-CM | POA: Diagnosis present

## 2014-11-26 DIAGNOSIS — J9621 Acute and chronic respiratory failure with hypoxia: Secondary | ICD-10-CM | POA: Diagnosis not present

## 2014-11-26 DIAGNOSIS — R778 Other specified abnormalities of plasma proteins: Secondary | ICD-10-CM

## 2014-11-26 DIAGNOSIS — I249 Acute ischemic heart disease, unspecified: Secondary | ICD-10-CM | POA: Diagnosis present

## 2014-11-26 DIAGNOSIS — A419 Sepsis, unspecified organism: Secondary | ICD-10-CM | POA: Diagnosis present

## 2014-11-26 DIAGNOSIS — J441 Chronic obstructive pulmonary disease with (acute) exacerbation: Secondary | ICD-10-CM | POA: Diagnosis present

## 2014-11-26 DIAGNOSIS — I25110 Atherosclerotic heart disease of native coronary artery with unstable angina pectoris: Secondary | ICD-10-CM

## 2014-11-26 DIAGNOSIS — I340 Nonrheumatic mitral (valve) insufficiency: Secondary | ICD-10-CM | POA: Diagnosis present

## 2014-11-26 DIAGNOSIS — I350 Nonrheumatic aortic (valve) stenosis: Secondary | ICD-10-CM | POA: Diagnosis present

## 2014-11-26 DIAGNOSIS — Y95 Nosocomial condition: Secondary | ICD-10-CM | POA: Diagnosis present

## 2014-11-26 DIAGNOSIS — J440 Chronic obstructive pulmonary disease with acute lower respiratory infection: Secondary | ICD-10-CM | POA: Diagnosis present

## 2014-11-26 DIAGNOSIS — Z7982 Long term (current) use of aspirin: Secondary | ICD-10-CM

## 2014-11-26 DIAGNOSIS — J9601 Acute respiratory failure with hypoxia: Secondary | ICD-10-CM

## 2014-11-26 DIAGNOSIS — D649 Anemia, unspecified: Secondary | ICD-10-CM | POA: Diagnosis present

## 2014-11-26 DIAGNOSIS — Z9981 Dependence on supplemental oxygen: Secondary | ICD-10-CM

## 2014-11-26 DIAGNOSIS — I959 Hypotension, unspecified: Secondary | ICD-10-CM | POA: Diagnosis present

## 2014-11-26 DIAGNOSIS — I272 Other secondary pulmonary hypertension: Secondary | ICD-10-CM | POA: Diagnosis present

## 2014-11-26 DIAGNOSIS — Z87891 Personal history of nicotine dependence: Secondary | ICD-10-CM

## 2014-11-26 DIAGNOSIS — J9620 Acute and chronic respiratory failure, unspecified whether with hypoxia or hypercapnia: Secondary | ICD-10-CM | POA: Diagnosis present

## 2014-11-26 DIAGNOSIS — I5032 Chronic diastolic (congestive) heart failure: Secondary | ICD-10-CM | POA: Diagnosis present

## 2014-11-26 DIAGNOSIS — I5189 Other ill-defined heart diseases: Secondary | ICD-10-CM | POA: Diagnosis present

## 2014-11-26 DIAGNOSIS — I214 Non-ST elevation (NSTEMI) myocardial infarction: Principal | ICD-10-CM | POA: Insufficient documentation

## 2014-11-26 DIAGNOSIS — I472 Ventricular tachycardia: Secondary | ICD-10-CM | POA: Diagnosis not present

## 2014-11-26 HISTORY — DX: Chronic obstructive pulmonary disease, unspecified: J44.9

## 2014-11-26 HISTORY — DX: Chronic respiratory failure, unspecified whether with hypoxia or hypercapnia: J96.10

## 2014-11-26 HISTORY — DX: Other ill-defined heart diseases: I51.89

## 2014-11-26 HISTORY — DX: Atherosclerotic heart disease of native coronary artery without angina pectoris: I25.10

## 2014-11-26 HISTORY — DX: Pneumonia, unspecified organism: J18.9

## 2014-11-26 HISTORY — DX: Benign prostatic hyperplasia without lower urinary tract symptoms: N40.0

## 2014-11-26 LAB — BLOOD GAS, ARTERIAL
ALLENS TEST (PASS/FAIL): POSITIVE — AB
Acid-Base Excess: 6.6 mmol/L — ABNORMAL HIGH (ref 0.0–3.0)
BICARBONATE: 34.8 meq/L — AB (ref 21.0–28.0)
Delivery systems: POSITIVE
Expiratory PAP: 5
FIO2: 1 %
INSPIRATORY PAP: 12
LHR: 40 {breaths}/min
MECHANICAL RATE: 10
O2 Saturation: 98.7 %
PCO2 ART: 66 mmHg — AB (ref 32.0–48.0)
Patient temperature: 37
pH, Arterial: 7.33 — ABNORMAL LOW (ref 7.350–7.450)
pO2, Arterial: 127 mmHg — ABNORMAL HIGH (ref 83.0–108.0)

## 2014-11-26 LAB — URINALYSIS COMPLETE WITH MICROSCOPIC (ARMC ONLY)
Bacteria, UA: NONE SEEN
Bilirubin Urine: NEGATIVE
Glucose, UA: NEGATIVE mg/dL
Ketones, ur: NEGATIVE mg/dL
Leukocytes, UA: NEGATIVE
Nitrite: NEGATIVE
Protein, ur: 30 mg/dL — AB
SPECIFIC GRAVITY, URINE: 1.015 (ref 1.005–1.030)
Squamous Epithelial / LPF: NONE SEEN
pH: 6 (ref 5.0–8.0)

## 2014-11-26 LAB — COMPREHENSIVE METABOLIC PANEL
ALBUMIN: 2.7 g/dL — AB (ref 3.5–5.0)
ALT: 18 U/L (ref 17–63)
ANION GAP: 7 (ref 5–15)
AST: 46 U/L — ABNORMAL HIGH (ref 15–41)
Alkaline Phosphatase: 92 U/L (ref 38–126)
BUN: 17 mg/dL (ref 6–20)
CO2: 30 mmol/L (ref 22–32)
CREATININE: 1.08 mg/dL (ref 0.61–1.24)
Calcium: 8 mg/dL — ABNORMAL LOW (ref 8.9–10.3)
Chloride: 106 mmol/L (ref 101–111)
GFR calc Af Amer: >60 mL/min (ref >60)
GFR calc non Af Amer: >60 mL/min (ref >60)
GLUCOSE: 162 mg/dL — AB (ref 65–99)
Potassium: 3.9 mmol/L (ref 3.5–5.1)
SODIUM: 143 mmol/L (ref 135–145)
TOTAL PROTEIN: 6.3 g/dL — AB (ref 6.5–8.1)
Total Bilirubin: 0.3 mg/dL (ref 0.3–1.2)

## 2014-11-26 LAB — CBC WITH DIFFERENTIAL/PLATELET
BASOS PCT: 13 %
Basophils Absolute: 2.2 10*3/uL — ABNORMAL HIGH (ref 0–0.1)
Eosinophils Absolute: 0 10*3/uL (ref 0–0.7)
Eosinophils Relative: 0 %
HCT: 33.6 % — ABNORMAL LOW (ref 40.0–52.0)
Hemoglobin: 10.7 g/dL — ABNORMAL LOW (ref 13.0–18.0)
LYMPHS ABS: 0.5 10*3/uL — AB (ref 1.0–3.6)
Lymphocytes Relative: 3 %
MCH: 29.5 pg (ref 26.0–34.0)
MCHC: 32 g/dL (ref 32.0–36.0)
MCV: 92.3 fL (ref 80.0–100.0)
Monocytes Absolute: 0.7 10*3/uL (ref 0.2–1.0)
Monocytes Relative: 4 %
NEUTROS ABS: 13.5 10*3/uL — AB (ref 1.4–6.5)
Neutrophils Relative %: 80 %
Platelets: 175 10*3/uL (ref 150–440)
RBC: 3.64 MIL/uL — AB (ref 4.40–5.90)
RDW: 16 % — ABNORMAL HIGH (ref 11.5–14.5)
WBC: 16.9 10*3/uL — ABNORMAL HIGH (ref 3.8–10.6)

## 2014-11-26 LAB — TROPONIN I
TROPONIN I: 6.37 ng/mL — AB (ref <0.031)
TROPONIN I: 6.79 ng/mL — AB (ref <0.031)
Troponin I: 4.2 ng/mL — ABNORMAL HIGH (ref <0.031)
Troponin I: 7.26 ng/mL — ABNORMAL HIGH (ref <0.031)

## 2014-11-26 LAB — HEPARIN LEVEL (UNFRACTIONATED): HEPARIN UNFRACTIONATED: 0.74 [IU]/mL — AB (ref 0.30–0.70)

## 2014-11-26 LAB — PROTIME-INR
INR: 1.01
Prothrombin Time: 13.5 seconds (ref 11.4–15.0)

## 2014-11-26 LAB — LACTIC ACID, PLASMA
LACTIC ACID, VENOUS: 1.7 mmol/L (ref 0.5–2.0)
Lactic Acid, Venous: 1 mmol/L (ref 0.5–2.0)

## 2014-11-26 LAB — APTT: aPTT: 27 seconds (ref 24–36)

## 2014-11-26 MED ORDER — IPRATROPIUM-ALBUTEROL 0.5-2.5 (3) MG/3ML IN SOLN
3.0000 mL | Freq: Once | RESPIRATORY_TRACT | Status: AC
  Administered 2014-11-26: 3 mL via RESPIRATORY_TRACT

## 2014-11-26 MED ORDER — HEPARIN (PORCINE) IN NACL 100-0.45 UNIT/ML-% IJ SOLN
900.0000 [IU]/h | INTRAMUSCULAR | Status: DC
  Administered 2014-11-26: 800 [IU]/h via INTRAVENOUS
  Administered 2014-11-27: 750 [IU]/h via INTRAVENOUS
  Filled 2014-11-26 (×4): qty 250

## 2014-11-26 MED ORDER — PIPERACILLIN-TAZOBACTAM 3.375 G IVPB
3.3750 g | Freq: Three times a day (TID) | INTRAVENOUS | Status: DC
  Administered 2014-11-26 – 2014-11-28 (×5): 3.375 g via INTRAVENOUS
  Filled 2014-11-26 (×11): qty 50

## 2014-11-26 MED ORDER — SODIUM CHLORIDE 0.9 % IV SOLN
INTRAVENOUS | Status: AC
  Administered 2014-11-26: 09:00:00 via INTRAVENOUS

## 2014-11-26 MED ORDER — METHYLPREDNISOLONE SODIUM SUCC 125 MG IJ SOLR
60.0000 mg | Freq: Four times a day (QID) | INTRAMUSCULAR | Status: DC
  Administered 2014-11-26 – 2014-11-27 (×5): 60 mg via INTRAVENOUS
  Filled 2014-11-26 (×4): qty 2

## 2014-11-26 MED ORDER — PANTOPRAZOLE SODIUM 40 MG PO TBEC
40.0000 mg | DELAYED_RELEASE_TABLET | Freq: Every day | ORAL | Status: DC
  Administered 2014-11-26 – 2014-11-28 (×3): 40 mg via ORAL
  Filled 2014-11-26 (×3): qty 1

## 2014-11-26 MED ORDER — SODIUM CHLORIDE 0.9 % IV BOLUS (SEPSIS)
1000.0000 mL | Freq: Once | INTRAVENOUS | Status: AC
  Administered 2014-11-26: 1000 mL via INTRAVENOUS

## 2014-11-26 MED ORDER — DEXTROSE 5 % IV SOLN
2.0000 g | INTRAVENOUS | Status: DC
  Filled 2014-11-26: qty 2

## 2014-11-26 MED ORDER — ACETAMINOPHEN 650 MG RE SUPP: 650.0000 mg | Freq: Four times a day (QID) | RECTAL | Status: DC | PRN

## 2014-11-26 MED ORDER — ASPIRIN 300 MG RE SUPP
RECTAL | Status: AC
  Administered 2014-11-26: 300 mg via RECTAL
  Filled 2014-11-26: qty 1

## 2014-11-26 MED ORDER — ACETAMINOPHEN 650 MG RE SUPP
RECTAL | Status: AC
  Administered 2014-11-26: 650 mg via RECTAL
  Filled 2014-11-26: qty 1

## 2014-11-26 MED ORDER — ASPIRIN 300 MG RE SUPP
300.0000 mg | Freq: Once | RECTAL | Status: AC
Start: 2014-11-26 — End: 2014-11-26
  Administered 2014-11-26: 300 mg via RECTAL

## 2014-11-26 MED ORDER — DEXTROSE 5 % IV SOLN
2.0000 g | Freq: Once | INTRAVENOUS | Status: AC
  Administered 2014-11-26: 2 g via INTRAVENOUS
  Filled 2014-11-26: qty 2

## 2014-11-26 MED ORDER — ACETAMINOPHEN 650 MG RE SUPP
650.0000 mg | Freq: Once | RECTAL | Status: AC
  Administered 2014-11-26: 650 mg via RECTAL

## 2014-11-26 MED ORDER — ONDANSETRON HCL 4 MG PO TABS: 4.0000 mg | ORAL_TABLET | Freq: Four times a day (QID) | ORAL | Status: DC | PRN

## 2014-11-26 MED ORDER — IPRATROPIUM-ALBUTEROL 0.5-2.5 (3) MG/3ML IN SOLN
RESPIRATORY_TRACT | Status: AC
  Administered 2014-11-26: 3 mL via RESPIRATORY_TRACT
  Filled 2014-11-26: qty 9

## 2014-11-26 MED ORDER — PIPERACILLIN-TAZOBACTAM 3.375 G IVPB
3.3750 g | Freq: Two times a day (BID) | INTRAVENOUS | Status: DC
  Filled 2014-11-26 (×3): qty 50

## 2014-11-26 MED ORDER — VANCOMYCIN HCL IN DEXTROSE 1-5 GM/200ML-% IV SOLN
1000.0000 mg | INTRAVENOUS | Status: DC
  Administered 2014-11-26 – 2014-11-28 (×3): 1000 mg via INTRAVENOUS
  Filled 2014-11-26 (×4): qty 200

## 2014-11-26 MED ORDER — MOMETASONE FURO-FORMOTEROL FUM 100-5 MCG/ACT IN AERO
2.0000 | INHALATION_SPRAY | Freq: Two times a day (BID) | RESPIRATORY_TRACT | Status: DC
  Administered 2014-11-26 – 2014-11-29 (×7): 2 via RESPIRATORY_TRACT
  Filled 2014-11-26 (×2): qty 8.8

## 2014-11-26 MED ORDER — IPRATROPIUM BROMIDE 0.02 % IN SOLN
0.5000 mg | Freq: Four times a day (QID) | RESPIRATORY_TRACT | Status: DC
Start: 2014-11-26 — End: 2014-11-27
  Administered 2014-11-26 – 2014-11-27 (×5): 0.5 mg via RESPIRATORY_TRACT
  Filled 2014-11-26 (×5): qty 2.5

## 2014-11-26 MED ORDER — METHYLPREDNISOLONE SODIUM SUCC 125 MG IJ SOLR
INTRAMUSCULAR | Status: AC
  Administered 2014-11-26: 125 mg via INTRAVENOUS
  Filled 2014-11-26: qty 2

## 2014-11-26 MED ORDER — VANCOMYCIN HCL IN DEXTROSE 1-5 GM/200ML-% IV SOLN
1000.0000 mg | Freq: Once | INTRAVENOUS | Status: AC
  Administered 2014-11-26: 1000 mg via INTRAVENOUS

## 2014-11-26 MED ORDER — ASPIRIN EC 81 MG PO TBEC
81.0000 mg | DELAYED_RELEASE_TABLET | Freq: Every day | ORAL | Status: DC
  Administered 2014-11-26 – 2014-11-29 (×4): 81 mg via ORAL
  Filled 2014-11-26 (×4): qty 1

## 2014-11-26 MED ORDER — VANCOMYCIN HCL IN DEXTROSE 1-5 GM/200ML-% IV SOLN
INTRAVENOUS | Status: AC
  Administered 2014-11-26: 1000 mg via INTRAVENOUS
  Filled 2014-11-26: qty 200

## 2014-11-26 MED ORDER — ASPIRIN EC 81 MG PO TBEC: 81.0000 mg | DELAYED_RELEASE_TABLET | Freq: Every day | ORAL | Status: DC

## 2014-11-26 MED ORDER — METHYLPREDNISOLONE SODIUM SUCC 125 MG IJ SOLR
125.0000 mg | Freq: Once | INTRAMUSCULAR | Status: AC
  Administered 2014-11-26: 125 mg via INTRAVENOUS

## 2014-11-26 MED ORDER — ONDANSETRON HCL 4 MG/2ML IJ SOLN: 4.0000 mg | Freq: Four times a day (QID) | INTRAMUSCULAR | Status: DC | PRN

## 2014-11-26 MED ORDER — ACETAMINOPHEN 325 MG PO TABS: 650.0000 mg | ORAL_TABLET | Freq: Four times a day (QID) | ORAL | Status: DC | PRN

## 2014-11-26 MED ORDER — SODIUM CHLORIDE 0.9 % IV BOLUS (SEPSIS)
2000.0000 mL | Freq: Once | INTRAVENOUS | Status: AC
  Administered 2014-11-26: 2000 mL via INTRAVENOUS

## 2014-11-26 MED ORDER — METHYLPREDNISOLONE SODIUM SUCC 40 MG IJ SOLR
INTRAMUSCULAR | Status: AC
  Filled 2014-11-26: qty 2

## 2014-11-26 MED ORDER — DEXTROSE 5 % IV SOLN
1.0000 g | Freq: Once | INTRAVENOUS | Status: AC
  Administered 2014-11-26: 1 g via INTRAVENOUS
  Filled 2014-11-26: qty 1

## 2014-11-26 MED ORDER — HEPARIN BOLUS VIA INFUSION
3900.0000 [IU] | Freq: Once | INTRAVENOUS | Status: AC
  Administered 2014-11-26: 3900 [IU] via INTRAVENOUS
  Filled 2014-11-26: qty 3900

## 2014-11-26 NOTE — Progress Notes (Signed)
[facility] [place] [facility]   PATIENT NAME: Keith Watts    MR#:  [id number]  DATE OF BIRTH:  1940/12/16  SUBJECTIVE:   Came in with increasing sob and cough. Was found to have NSTEMI and pneumonia Feels some better today. "rattly cough" REVIEW OF SYSTEMS:    Review of Systems  Constitutional: Positive for fever and malaise/fatigue. Negative for chills and weight loss.  HENT: Negative for ear discharge, ear pain and nosebleeds.   Eyes: Negative for blurred vision, pain and discharge.  Respiratory: Positive for cough, sputum production and shortness of breath. Negative for wheezing and stridor.   Cardiovascular: Positive for chest pain. Negative for palpitations, orthopnea and PND.  Gastrointestinal: Negative for nausea, vomiting, abdominal pain and diarrhea.  Genitourinary: Negative for urgency and frequency.  Musculoskeletal: Negative for back pain and joint pain.  Neurological: Positive for weakness. Negative for sensory change, speech change and focal weakness.  Psychiatric/Behavioral: Negative for depression. The patient is not nervous/anxious.     Tolerating Diet:yes Tolerating PT: eval pending  DRUG ALLERGIES:  No Known Allergies  VITALS:  Blood pressure 110/50, pulse 99, temperature 97.7 F (36.5 C), temperature source Oral, resp. rate 25, height  (1.626 m), weight 65.318 kg (144 lb), SpO2 97 %.  PHYSICAL EXAMINATION:   Physical Exam  GENERAL:  74 y.o.-year-old patient lying in the bed with no acute distress.  -weak and frail EYES: Pupils equal, round, reactive to light and accommodation. No scleral icterus. Extraocular muscles intact.  HEENT: Head atraumatic, normocephalic. Oropharynx and nasopharynx clear.  NECK:  Supple, no jugular venous distention. No thyroid enlargement, no tenderness.  LUNGS: coarse breath sounds bilaterally, no wheezing,++  rales, rhonchi. No use of accessory muscles of respiration.  CARDIOVASCULAR: S1,  S2 normal. No murmurs, rubs, or gallops.  ABDOMEN: Soft, nontender, nondistended. Bowel sounds present. No organomegaly or mass.  EXTREMITIES: No cyanosis, clubbing or edema b/l.    NEUROLOGIC: Cranial nerves II through XII are intact. No focal Motor or sensory deficits b/l.   PSYCHIATRIC: The patient is alert and oriented x 3.  SKIN: No obvious rash, lesion, or ulcer.   LABORATORY PANEL:   CBC  Recent Labs Lab 11/26/14 0240  WBC 16.9*  HGB 10.7*  HCT 33.6*  PLT 175    Chemistries   Recent Labs Lab 11/26/14 0240  NA 143  K 3.9  CL 106  CO2 30  GLUCOSE 162*  BUN 17  CREATININE 1.08  CALCIUM 8.0*  AST 46*  ALT 18  ALKPHOS 92  BILITOT 0.3   Cardiac Enzymes  Recent Labs Lab 11/26/14 1306  TROPONINI 7.26*   ------------------------------------------------------------------------------------------------------------------  RADIOLOGY:  Dg Chest Port 1 View  11/26/2014   CLINICAL DATA:  Shortness of breath. Progressive over the last 6 hours. Hypoxia and dyspnea.  EXAM: PORTABLE CHEST - 1 VIEW  COMPARISON:  Radiograph 10/20/2014, CT 11/07/2013  FINDINGS: Cardiomediastinal contours are normal. There is increasing left basilar opacity from prior exam. Unchanged linear right basilar opacity. No consolidation in the upper lung zones. Underlying emphysema. No pleural effusion or pneumothorax.  IMPRESSION: Increasing left basilar opacity, may reflect atelectasis or pneumonia. Unchanged opacity at the right lung base. Followup PA and lateral chest X-ray is recommended in 3-4 weeks following trial of antibiotic therapy to ensure resolution and exclude underlying malignancy.   Electronically Signed   By: Rubye Oaks M.D.   On: 11/26/2014 02:05   ASSESSMENT AND PLAN:  74 y.o. male with a  known history of COPD on home oxygen 3 L/m, [facility] presents to the emergency room with the complaints of acute onset of shortness of breath leading to  severe respiratory distress.  1. Acute on chronic respiratory failure with hypoxia secondary to combination of left basilar pneumonia and COPD exacerbation. -IV solumedrol, nebs, oxygen -IV Vanc and zosyn (shortage of cefepime) -f/u BC and sputum cx  2. Sepsis secondary to left basilar pneumonia, [facility] with [facility]. HCAP. -IVF -bp stable. Not on any pressors  3. Acute NSTEMI with  history of CAD 1 vessel  heart disease by cath in 2010 -Pt on IV heaprin gtt -to be seen by Dr Herbie BaltimoreHarding and likely plans for Heart cath in am -cont asa, nitro prn. Not on BB due to low BP -start statins  4. Arrhythmia  -pt had 11 second run of V. tach while in the ED resolved spontaneously. Patient maintaining sinus rhythm.  5. COPD with acute exacerbation. -rx as above  6.BPH   Case discussed with Care Management/Social Worker. Management plans discussed with the patient and  in agreement.  CODE STATUS: FULL  DVT Prophylaxis: on heparin gtt  TOTAL TIME TAKING CARE OF THIS PATIENT: 35  minutes.   POSSIBLE D/C IN 2-3DAYS, DEPENDING ON CLINICAL CONDITION.   Rodina Pinales M.D on 11/26/2014 at 2:06 PM  Between 7am to 6pm - Pager - [phone]  After 6pm go to [email or web address] - password [email or web address] [facility]  [facility]  Office  [phone]  CC: Primary care physician; Mila Merryonald Fisher, MD

## 2014-11-26 NOTE — ED Notes (Signed)
Pt presents to ED via EMS from home with c/o shortness of breath. Per EMS, pt's wife came home around 7pm and at that time pt was complaining of some shortness of breath. EMS reports that pt began to have respiratory distress approximately 30 mins prior to their arrival. Pt presents with nonrebreather in place by EMS. Pt answers some questions during triage. Per EMS, pt has history of COPD.

## 2014-11-26 NOTE — ED Provider Notes (Signed)
[facility] Emergency Department Provider Note  ____________________________________________  Time seen: Approximately 0040 AM  I have reviewed the triage vital signs and the nursing notes.  History is limited by patient's severe respiratory distress and somnolence.   HISTORY  Chief Complaint Shortness of Breath and Tachycardia    HPI Keith Watts is a 74 y.o. male who did well today. Per EMS the patient's wife came home at approximately 1900 and the patient felt warm and feverish. The patient continued to go down hill as the evening went on and developed some acute shortness of breath proximally 30 minutes prior to the call of EMS. Per EMS the patient's initial oxygen saturations were 84% on room air. They did start the patient on a DuoNeb treatment with 5 mg of albuterol and 500 g of Atrovent. Her EMS the patient has a history of COPD with no other medical history. The patient does report that he has some left-sided chest pain as well. They did not take his temperature prior to bring him into the hospital.The patient is unable to answer many questions due to his respiratory distress.   Past Medical History  Diagnosis Date  . COPD (chronic obstructive pulmonary disease)     Patient Active Problem List   Diagnosis Date Noted  . Sepsis 10/25/2014    No past surgical history on file.  Current Outpatient Rx  Name  Route  Sig  Dispense  Refill  . albuterol (PROVENTIL) (2.5 MG/3ML) 0.083% nebulizer solution   Nebulization   Take 2.5 mg by nebulization every 6 (six) hours as needed for wheezing or shortness of breath.         Marland Kitchen aspirin EC 81 MG tablet   Oral   Take 81 mg by mouth daily.         . finasteride (PROSCAR) 5 MG tablet   Oral   Take 5 mg by mouth at bedtime.         Marland Kitchen aspirin 325 MG tablet   Oral   Take 1 tablet (325 mg total) by mouth daily.   30 tablet   0   . diltiazem (CARDIZEM) 60 MG tablet   Oral   Take 1 tablet  (60 mg total) by mouth every 6 (six) hours.   40 tablet   0   . Fluticasone-Salmeterol (ADVAIR) 250-50 MCG/DOSE AEPB   Inhalation   Inhale 1 puff into the lungs 2 (two) times daily.           Allergies Review of patient's allergies indicates no known allergies.  No family history on file.  Social History History  Substance Use Topics  . Smoking status: Not on file  . Smokeless tobacco: Not on file  . Alcohol Use: Not on file    Review of Systems Constitutional: Fever Eyes: No visual changes. ENT: No sore throat. Cardiovascular:chest pain. Respiratory: shortness of breath. Gastrointestinal: No abdominal pain.  No nausea, no vomiting.   Genitourinary: Negative for dysuria. Musculoskeletal: Negative for back pain. Skin: Negative for rash. Neurological: No focal weakness or numbness  10-point ROS otherwise negative.  ____________________________________________   PHYSICAL EXAM:  VITAL SIGNS: ED Triage Vitals  Enc Vitals Group     BP 11/26/14 0052 67/54 mmHg     Pulse Rate 11/26/14 0038 139     Resp 11/26/14 0038 40     Temp --      Temp src --      SpO2 11/26/14 0038 94 %  Weight 11/26/14 0052 144 lb (65.318 kg)     Height --      Head Cir --      Peak Flow --      Pain Score --      Pain Loc --      Pain Edu? --      Excl. in GC? --     Constitutional: Patient in severe respiratory distress and somnolent. Eyes: Conjunctivae are normal. PERRL. EOMI. Head: Atraumatic. Nose: No congestion/rhinnorhea. Mouth/Throat: Mucous membranes are moist.  Oropharynx non-erythematous. Cardiovascular: Tachycardia regular rhythm. Grossly normal heart sounds.  Decreased palpable pulses Respiratory: Tachypnea with tight wheezes throughout all lung fields, patient unable to speak due to respiratory distress Gastrointestinal: Soft and nontender. No distention. Positive bowel sounds Genitourinary: Deferred Musculoskeletal: No lower extremity tenderness nor edema.    Neurologic:  No gross focal neurologic deficits are appreciated. Skin:  Skin is cool and diaphoretic. Psychiatric: Patient is somnolent but able to shake or not his head to answer questions.  ____________________________________________   LABS (all labs ordered are listed, but only abnormal results are displayed)  Labs Reviewed  BLOOD GAS, ARTERIAL - Abnormal; Notable for the following:    pH, Arterial 7.33 (*)    pCO2 arterial 66 (*)    pO2, Arterial 127 (*)    Bicarbonate 34.8 (*)    Acid-Base Excess 6.6 (*)    Allens test (pass/fail) POSITIVE (*)    All other components within normal limits  CBC WITH DIFFERENTIAL/PLATELET - Abnormal; Notable for the following:    WBC 16.9 (*)    RBC 3.64 (*)    Hemoglobin 10.7 (*)    HCT 33.6 (*)    RDW 16.0 (*)    All other components within normal limits  COMPREHENSIVE METABOLIC PANEL - Abnormal; Notable for the following:    Glucose, Bld 162 (*)    Calcium 8.0 (*)    Total Protein 6.3 (*)    Albumin 2.7 (*)    AST 46 (*)    All other components within normal limits  TROPONIN I - Abnormal; Notable for the following:    Troponin I 4.20 (*)    All other components within normal limits  URINALYSIS COMPLETEWITH MICROSCOPIC ([facility] ONLY) - Abnormal; Notable for the following:    Color, Urine YELLOW (*)    APPearance CLEAR (*)    Hgb urine dipstick 1+ (*)    Protein, ur 30 (*)    All other components within normal limits  CULTURE, BLOOD (ROUTINE X 2)  CULTURE, BLOOD (ROUTINE X 2)  URINE CULTURE  LACTIC ACID, PLASMA  LACTIC ACID, PLASMA   ____________________________________________  EKG  ED ECG REPORT I, Rebecka Apley, the attending physician, personally viewed and interpreted this ECG.   Date: 11/26/2014  EKG Time: 0034  Rate: 144  Rhythm: sinus tachycardia  Axis: Normal  Intervals:left anterior fascicular block  ST&T Change: No ST segment  elevation  ____________________________________________  RADIOLOGY  Chest x-ray: Increasing left basilar opacity may reflect atelectasis or pneumonia ____________________________________________   PROCEDURES  Procedure(s) performed: None  Critical Care performed: Yes, see critical care note(s)   CRITICAL CARE Performed by: Lucrezia Europe P   Total critical care time: 60  Critical care time was exclusive of separately billable procedures and treating other patients.  Critical care was necessary to treat or prevent imminent or life-threatening deterioration.  Critical care was time spent personally by me on the following activities: development of treatment plan with  patient and/or surrogate as well as nursing, discussions with consultants, evaluation of patient's response to treatment, examination of patient, obtaining history from patient or surrogate, ordering and performing treatments and interventions, ordering and review of laboratory studies, ordering and review of radiographic studies, pulse oximetry and re-evaluation of patient's condition.   ____________________________________________   INITIAL IMPRESSION / ASSESSMENT AND PLAN / ED COURSE  Pertinent labs & imaging results that were available during my care of the patient were reviewed by me and considered in my medical decision making (see chart for details).  The patient is a 74 year old male who comes in with difficulty breathing and hypoxia as well as feeling warm. The patient does have a history of COPD and has been admitted to the hospital in the past with severe sepsis and pneumonia. I will call a code sepsis given the patient is tachycardic, febrile and hypotensive. I did contact respiratory therapy and have them place the patient on BiPAP as his oxygen saturations while receiving neb treatment was still 88%. Once the BiPAP was started the patient did have some improvement in his oxygen saturation. We also  continued to do duo nebs while he was here. The patient was ordered 2 L of normal saline as that is 30 ML's per kilogram of normal saline which follows the sepsis protocol. The patient also was ordered some healthcare acquired pneumonia antibiotics cefepime and vancomycin as the patient was admitted for sepsis in April. We will continue to await the patient's blood work and reassess the patient once we've received his studies.  ----------------------------------------- 2:14 AM on 11/26/2014 -----------------------------------------  Patient had a self resolving run of V. tach and a subsequent low blood pressure which we did give a liter of normal saline IV and will reassess. We are still awaiting the results of the patient's blood work  ----------------------------------------- 3:41 AM on 11/26/2014 -----------------------------------------  We placed a Foley catheter to help drain the patient's bladder. The patient's blood pressure did respond well to the liter of fluid with the current blood pressure of 111/63. The patient though did have a troponin that came back at 4.2. He will receive some rectal aspirin as well as heparin. The patient may have had some hypovolemic ischemia or ischemia due to tachycardia. He will be evaluated in the hospital. I did discuss his care with the hospitalist service who will accept the patient onto their service and to continue to treat the patient. He shouldn't is still on BiPAP but his work of breathing is improved and his wheezing is improved. ____________________________________________   FINAL CLINICAL IMPRESSION(S) / ED DIAGNOSES  Final diagnoses:  Healthcare-associated pneumonia  Sepsis due to pneumonia  Elevated troponin  Acute respiratory failure with hypoxia      Rebecka ApleyAllison P Lincoln Ginley, MD 11/26/14 [phone]

## 2014-11-26 NOTE — Care Management Note (Signed)
Case Management Note  Patient Details  Name: Keith Watts MRN: [id number] Date of Birth: 08-11-1940  Subjective/Objective:  Admitted to CCU with acute shortness of breath resulting in severe respiratory distress. History of COPD with chronic O2 @ 3L. [facility] for respiratory distress and PNA. Pt sleeping and no family present.                  Action/Plan:   Expected Discharge Date:                  Expected Discharge Plan:     In-House Referral:     Discharge planning Services  CM Consult  Post Acute Care Choice:    Choice offered to:     DME Arranged:    DME Agency:     HH Arranged:    HH Agency:     Status of Service:  In process, will continue to follow  Medicare Important Message Given:    Date Medicare IM Given:    Medicare IM give by:    Date Additional Medicare IM Given:    Additional Medicare Important Message give by:     If discussed at Long Length of Stay Meetings, dates discussed:    Additional Comments:  Marily MemosLisa M Amarrah Meinhart, RN 11/26/2014, 11:46 AM

## 2014-11-26 NOTE — Progress Notes (Signed)
ANTICOAGULATION CONSULT NOTE - Initial Consult  Pharmacy Consult for heparin consult Indication: ACS/STEMI  No Known Allergies  Patient Measurements: Weight: 144 lb (65.318 kg) Heparin Dosing Weight: 65.3 kg  Vital Signs: Temp: 99.6 F (37.6 C) (06/01 0656) Temp Source: Rectal (06/01 0656) BP: 100/76 mmHg (06/01 0656) Pulse Rate: 106 (06/01 0510)  Labs:  Recent Labs  11/26/14 0240 11/26/14 0609  HGB 10.7*  --   HCT 33.6*  --   PLT 175  --   CREATININE 1.08  --   TROPONINI 4.20* 6.37*    Estimated Creatinine Clearance: 50.9 mL/min (by C-G formula based on Cr of 1.08).   Medical History: Past Medical History  Diagnosis Date  . COPD (chronic obstructive pulmonary disease)   . Pneumonia     Medications:    Assessment:   APTT____   INR______       Goal of Therapy:  Heparin level 0.3-0.7 units/ml    Plan:  3900 bolus and initial rate of 800 units/hr. Anti-Xa 8 hours after start of drip.  Keith Watts 11/26/2014,7:11 AM

## 2014-11-26 NOTE — Progress Notes (Signed)
ANTICOAGULATION CONSULT NOTE - Follow Up Consult  Pharmacy Consult for Heparin Indication: NSTEMI  No Known Allergies  Patient Measurements: Height: 5\' 4"  (162.6 cm) Weight: 148 lb 14.4 oz (67.541 kg) IBW/kg (Calculated) : 59.2 Heparin Dosing Weight: 67.5 kg  Vital Signs: Temp: 97.5 F (36.4 C) (06/01 1834) Temp Source: Oral (06/01 1834) BP: 116/59 mmHg (06/01 1834) Pulse Rate: 96 (06/01 1834)  Labs:  Recent Labs  11/26/14 0240 11/26/14 0609 11/26/14 0753 11/26/14 1306 11/26/14 1728  HGB 10.7*  --   --   --   --   HCT 33.6*  --   --   --   --   PLT 175  --   --   --   --   APTT  --   --  27  --   --   LABPROT  --   --  13.5  --   --   INR  --   --  1.01  --   --   HEPARINUNFRC  --   --   --   --  0.74*  CREATININE 1.08  --   --   --   --   TROPONINI 4.20* 6.37*  --  7.26* 6.79*    Estimated Creatinine Clearance: 51 mL/min (by C-G formula based on Cr of 1.08).   Medications:  Scheduled:  . aspirin EC  81 mg Oral Daily  . ipratropium  0.5 mg Nebulization Q6H  . methylPREDNISolone (SOLU-MEDROL) injection  60 mg Intravenous Q6H  . mometasone-formoterol  2 puff Inhalation BID  . pantoprazole  40 mg Oral Daily  . piperacillin-tazobactam (ZOSYN)  IV  3.375 g Intravenous 3 times per day  . vancomycin  1,000 mg Intravenous Q24H   Infusions:  . sodium chloride 75 mL/hr at 11/26/14 0906  . heparin 800 Units/hr (11/26/14 0907)   PRN: acetaminophen **OR** acetaminophen, ondansetron **OR** ondansetron (ZOFRAN) IV  Assessment: Heparin level is slightly above goal  Goal of Therapy:  Heparin level 0.3-0.7 units/ml Monitor platelets by anticoagulation protocol: Yes   Plan:  Will decrease heparin infusion to 750 unis/hr and recheck HL/CBC in 8 h.   Keith Watts, Keith Watts 11/26/2014,6:43 PM

## 2014-11-26 NOTE — ED Notes (Signed)
Pt noted to have run of V tach on monitor. MD made aware.

## 2014-11-26 NOTE — Progress Notes (Signed)
Pt resting comfortably.  Currently on 3L n/c.  Alert and oriented.  Wife at the bedside.  No c/o pain or SOB.  Pt did get tachypnic today when allowed to use BSC for BM.  Pt returned to baseline after being able to rest.  Plan is to rest for at least a day then Cardiology plans to Cath pt.  Family aware.  No orders have been placed.

## 2014-11-26 NOTE — Progress Notes (Signed)
Pt transferred to rm 254, all belongings and chart sent w/pt

## 2014-11-26 NOTE — Progress Notes (Signed)
Notified MD for critical lab Aerobic bottle only gram variable diplococci. Pt already on antibiotics. No new orders received.

## 2014-11-26 NOTE — Progress Notes (Signed)
*  PRELIMINARY RESULTS* Echocardiogram 2D Echocardiogram has been performed.  Keith Watts 11/26/2014, 1:32 PM

## 2014-11-26 NOTE — H&P (Signed)
[facility] [place] [facility]   PATIENT NAME: Keith LorHenry Carrasco    MR#:  [id number]  DATE OF BIRTH:  05-Aug-1940  DATE OF ADMISSION:  11/26/2014  PRIMARY CARE PHYSICIAN: Mila Merryonald Fisher, MD   REQUESTING/REFERRING PHYSICIAN: Dr. Zenda AlpersWebster  CHIEF COMPLAINT:   Chief Complaint  Patient presents with  . Shortness of Breath  . Tachycardia    HISTORY OF PRESENT ILLNESS:  Keith Watts  is a 74 y.o. male with a known history of COPD on home oxygen 3 L/m, [facility] with pneumonia presents to the emergency room with the complaints of acute onset of shortness of breath leading to severe respiratory distress. According to the patient's wife who is with the patient at this time, patient has been having increasing cough with sputum production for the past 3-4 days and yesterday evening she found the patient with acute shortness of breath, was also noted to have elevated temperature of 10 33F, hence called EMS, who found the patient in severe respiratory distress with room air oxygen saturations of 84%, was given DuoNeb's, oxygen and IV Solu-Medrol and brought to the emergency room for further evaluation. In the emergency room on arrival patient was found to be with severe dyspnea distress with decreased responsiveness, hypotensive with blood pressure of 67/54, temperature 10 92F. Patient was placed on BiPAP, vigorous DuoNeb nebs were given in the ED, was given IV fluid boluses following which his blood pressure improved and is currently maintaining in the upper 90s systolic. Workup revealed elevated white blood cell count of 16.9, lactic acid 1.7, troponin 4.2, chest x-ray with left basilar infiltrate and EKG sinus tachycardia with ventricular rate of 1 44 bpm. Patient was given rectal Tylenol, rectal aspirin, started on IV antibiotics-Vanco and cefepime after obtaining blood cultures, heparin drip and maintained on BiPAP. While in the ED, patient was noted to have a  run of V. tach on cardiac monitor of 36 beats 11 seconds which resolved spontaneously and patient is maintaining in sinus rhythm at present Patient's mental status has improved since arrival to the emergency room. Hospitalist service was consulted for further management. Patient at the current time is on BiPAP, resting comfortably, alert awake and oriented. Patient is not able to give a detailed history since he is wearing BiPAP machine and appears worn out.  PAST MEDICAL HISTORY:   Past Medical History  Diagnosis Date  . COPD (chronic obstructive pulmonary disease)   . Pneumonia     PAST SURGICAL HISTORY:   Past Surgical History  Procedure Laterality Date  . Eye surgery    . Appendectomy    . Tonsillectomy      SOCIAL HISTORY:   History  Substance Use Topics  . Smoking status: Former Games developermoker  . Smokeless tobacco: Not on file  . Alcohol Use: No    FAMILY HISTORY:   Family History  Problem Relation Age of Onset  . Cancer - Lung Mother   . Cancer - Colon Father     DRUG ALLERGIES:  No Known Allergies  REVIEW OF SYSTEMS:   Review of Systems  Unable to perform ROS: acuity of condition    MEDICATIONS AT HOME:   Prior to Admission medications   Medication Sig Start Date End Date Taking? Authorizing Provider  albuterol (PROVENTIL) (2.5 MG/3ML) 0.083% nebulizer solution Take 2.5 mg by nebulization every 6 (six) hours as needed for wheezing or shortness of breath.   Yes Historical Provider, MD  aspirin EC  81 MG tablet Take 81 mg by mouth daily.   Yes Historical Provider, MD  finasteride (PROSCAR) 5 MG tablet Take 5 mg by mouth at bedtime.   Yes Historical Provider, MD  aspirin 325 MG tablet Take 1 tablet (325 mg total) by mouth daily. 10/26/14   [name], MD  diltiazem (CARDIZEM) 60 MG tablet Take 1 tablet (60 mg total) by mouth every 6 (six) hours. 10/26/14   [name], MD  Fluticasone-Salmeterol (ADVAIR) 250-50 MCG/DOSE AEPB Inhale 1 puff into the lungs  2 (two) times daily.    Historical Provider, MD      VITAL SIGNS:  Blood pressure 101/57, pulse 110, temperature 100.4 F (38 C), temperature source Rectal, resp. rate 36, weight 65.318 kg (144 lb), SpO2 98 %.  PHYSICAL EXAMINATION:  Physical Exam  Constitutional: He appears well-developed and well-nourished. No distress.  HENT:  Head: Normocephalic and atraumatic.  Right Ear: External ear normal.  Left Ear: External ear normal.  Wearing BiPAP mask  Eyes: EOM are normal. Pupils are equal, round, and reactive to light. No scleral icterus.  Neck: Normal range of motion. Neck supple. No JVD present. No thyromegaly present.  Cardiovascular: Normal rate, regular rhythm, normal heart sounds and intact distal pulses.  Exam reveals no friction rub.   No murmur heard. Respiratory: Effort normal. No respiratory distress. He has wheezes. He has rales. He exhibits no tenderness.  GI: Soft. Bowel sounds are normal. He exhibits no distension and no mass. There is no tenderness. There is no rebound and no guarding.  Musculoskeletal: Normal range of motion. He exhibits no edema.  Lymphadenopathy:    He has no cervical adenopathy.  Neurological: He is alert. He has normal reflexes. No cranial nerve deficit. He exhibits normal muscle tone.  Skin: Skin is warm. No rash noted. No erythema.  Psychiatric:  Unable to assess since patient is on BiPAP mask.   LABORATORY PANEL:   CBC  Recent Labs Lab 11/26/14 0240  WBC 16.9*  HGB 10.7*  HCT 33.6*  PLT 175   ------------------------------------------------------------------------------------------------------------------  Chemistries   Recent Labs Lab 11/26/14 0240  NA 143  K 3.9  CL 106  CO2 30  GLUCOSE 162*  BUN 17  CREATININE 1.08  CALCIUM 8.0*  AST 46*  ALT 18  ALKPHOS 92  BILITOT 0.3   ------------------------------------------------------------------------------------------------------------------  Cardiac Enzymes  Recent  Labs Lab 11/26/14 0240  TROPONINI 4.20*   ------------------------------------------------------------------------------------------------------------------  RADIOLOGY:  Dg Chest Port 1 View  11/26/2014   CLINICAL DATA:  Shortness of breath. Progressive over the last 6 hours. Hypoxia and dyspnea.  EXAM: PORTABLE CHEST - 1 VIEW  COMPARISON:  Radiograph 10/20/2014, CT 11/07/2013  FINDINGS: Cardiomediastinal contours are normal. There is increasing left basilar opacity from prior exam. Unchanged linear right basilar opacity. No consolidation in the upper lung zones. Underlying emphysema. No pleural effusion or pneumothorax.  IMPRESSION: Increasing left basilar opacity, may reflect atelectasis or pneumonia. Unchanged opacity at the right lung base. Followup PA and lateral chest X-ray is recommended in 3-4 weeks following trial of antibiotic therapy to ensure resolution and exclude underlying malignancy.   Electronically Signed   By: Rubye Oaks M.D.   On: 11/26/2014 02:05    EKG:   Orders placed or performed during the hospital encounter of 11/26/14  . EKG 12-Lead sinus tachycardia with ventricular rate of 1 44 bpm. Left anterior fascicular block. No acute ischemic changes.   . EKG 12-Lead    IMPRESSION AND PLAN:  1. Acute on chronic respiratory failure with hypoxia secondary to combination of left basilar pneumonia and COPD exacerbation. 2. Sepsis secondary to left basilar pneumonia, recent [facility] admission in April 2016 with pneumonia. HCAP. 3. Elevated troponin-acute coronary syndrome. No history of prior heart disease. 4. 11 second run of V. tach while in the ED resolved spontaneously. Patient maintaining sinus rhythm. 5. COPD with acute exacerbation.   Plan: Admit to CCU, continue BiPAP, IV fluids, monitor blood pressure closely, continue heparin drip, aspirin. No beta blocker for now because of low blood pressure. Cycle cardiac enzymes. Echocardiogram and cardiology consultation  requested for further evaluation. Continue IV antibiotics-vancomycin and cefepime, IV Solu-Medrol, duo nebs, follow-up CBC, blood cultures.    All the records are reviewed and case discussed with ED provider. Management plans discussed with the patient, family and they are in agreement.  CODE STATUS: Full code  TOTAL TIME TAKING CARE OF THIS PATIENT: 50 minutes.    Jonnie Kind N M.D on 11/26/2014 at 5:10 AM  Between 7am to 6pm - Pager - [phone]  After 6pm go to [email or web address] - password [email or web address] [facility]  [facility]  Office  [phone]  CC: Primary care physician; [name], MD

## 2014-11-26 NOTE — Consult Note (Signed)
Cardiology Consultation Note  Patient ID: Keith Watts, MRN: [id number], DOB/AGE: 02/16/1941 74 y.o. Admit date: 11/26/2014   Date of Consult: 11/26/2014 Primary Physician: [name], MD Primary Cardiologist: [name], MD  Chief Complaint: SOB Reason for Consult: Elevated troponin   HPI: 74 y.o. male with h/o 1 vessel CAD managed medically by cardiac cath 2010, COPD, chronic respiratory failure on 3L nasal cannula at baseline, and BPH who was recently admitted to [facility] at the end of April into May with bilateral PNA, acute on chronic respiratory failure, and sepsis. He returns to [facility] on 5/31 with increased SOB, fever, sinus tachycardia, hypotension, hypoxia and was found to be septic with PNA and have an elevated troponin of 6.37.   He has known single vessel CAD by cardiac cath in 08/2008. At that time, back in 2010, he presented to [facility] with complaint of respiratory failure. He refused nuclear stress test and underwent cardiac cath on 09/24/2008 by outside cardiologist that showed mildly single vessel disease of the LCx, consider PCI if he continued to be symptomatic. Cardiac cath also showed mild to moderately elevated pulmonary capillary wedge pressure. There was mild pulmonary hypertension. Global left ventricular function was normal, ejection fraction greater than 55%. Aorta showed small aneurysm formation. In the mid right common femoral there was 60% stenosis.   He was recently admitted to [facility] at the end of April with bilateral PNA, sepsis, and acute on chronic respiratory failure. Troponin was found to be 0.11-->0.18-->0.14. He refused any invasive cardiac procedure during that admission. He did agree to undergo echo that showed EF 50-55%, low normal global left ventricular systolic function, impaired relaxation pattern of LV diastolic filling, normal right ventricular size and systolic function, mildly dilated left atrium, mild mitral valve regurgitation, borderline to Mild aortic  valve stenosis, normal RVSP. He was treated with IV antibiotics, steroids, initially BiPAP, and tapered down to O2 via nasal cannula. He was discharged on po Levaquin and prednisone.   He presented to [facility] on 5/31 with increased cough with sputum production for the past 3-4 days with associated SOB. He was also noted to have some left sided chest pain that radiated to his left shoulder. He was found to have a fever of 101 at home and was found to be in respiratory distress by EMS and brought to [facility]. Upon his arrival to [facility] he was found to be hypoxic in the upper 80's to mid 90's and was placed on BiPAP. Temp was 102.4. CXR showed increasing left basilar opacity possibly reflecting atelectasis vs PNA. WBC count of 16.9. Lactic acid 1.7. Troponin was 4.20-->6.37. He was started on vancomycin and cefepime.    Past Medical History  Diagnosis Date  . COPD (chronic obstructive pulmonary disease)   . Pneumonia   . CAD (coronary artery disease)     a. 1 vessel CAD by cardiac cath in 2010 managed medically   . Chronic respiratory failure     a. on 3L O2 via nasal cannula  . BPH (benign prostatic hyperplasia)       Most Recent Cardiac Studies: Echo 10/2014:  Summary:  1. Left ventricular ejection fraction, by visual estimation, is 50 to  55%.  2. Low normal global left ventricular systolic function.  3. Impaired relaxation pattern of LV diastolic filling.  4. Normal right ventricular size and systolic function.  5. Mildly dilated left atrium.  6. Mild mitral valve regurgitation.  7. Borderline to Mild aortic valve stenosis.  8. Normal RVSP  Cardiac cath 2010:  Cardiac catheterization on 09/24/2008 showed mildly to moderately elevated pulmonary capillary wedge pressure. There is mild pulmonary hypertension. Global left ventricular function was normal, ejection fraction greater than 55%. Aorta - there was small aneurysm formation. Consider PCI of circ. There is significant single vessel  coronary artery disease. In the mid right common femoral there was 60% stenosis   Surgical History:  Past Surgical History  Procedure Laterality Date  . Eye surgery    . Appendectomy    . Tonsillectomy       Home Meds: Prior to Admission medications   Medication Sig Start Date End Date Taking? Authorizing Provider  albuterol (PROVENTIL) (2.5 MG/3ML) 0.083% nebulizer solution Take 2.5 mg by nebulization every 6 (six) hours as needed for wheezing or shortness of breath.   Yes Historical Provider, MD  aspirin EC 81 MG tablet Take 81 mg by mouth daily.   Yes Historical Provider, MD  finasteride (PROSCAR) 5 MG tablet Take 5 mg by mouth at bedtime.   Yes Historical Provider, MD    Inpatient Medications:  . aspirin EC  81 mg Oral Daily  . ceFEPime (MAXIPIME) IV  1 g Intravenous Once  . [START ON 11/27/2014] ceFEPime (MAXIPIME) IV  2 g Intravenous Q24H  . ipratropium  0.5 mg Nebulization Q6H  . methylPREDNISolone (SOLU-MEDROL) injection  60 mg Intravenous Q6H  . mometasone-formoterol  2 puff Inhalation BID  . pantoprazole  40 mg Oral Daily  . vancomycin  1,000 mg Intravenous Q24H   . sodium chloride 75 mL/hr at 11/26/14 0906  . heparin 800 Units/hr (11/26/14 0907)    Allergies: No Known Allergies  History   Social History  . Marital Status: Married    Spouse Name: N/A  . Number of Children: N/A  . Years of Education: N/A   Occupational History  . Not on file.   Social History Main Topics  . Smoking status: Former Games developer  . Smokeless tobacco: Not on file  . Alcohol Use: No  . Drug Use: No  . Sexual Activity: Not on file   Other Topics Concern  . Not on file   Social History Narrative  . No narrative on file     Family History  Problem Relation Age of Onset  . Cancer - Lung Mother   . Cancer - Colon Father      Review of Systems: Review of Systems  Constitutional: Positive for fever, chills, weight loss and malaise/fatigue. Negative for diaphoresis.    Respiratory: Positive for cough, sputum production, shortness of breath and wheezing. Negative for hemoptysis.   Cardiovascular: Positive for palpitations. Negative for chest pain, orthopnea, claudication, leg swelling and PND.       Left upper chest and shoulder pain x 1 hour on 5/31  Gastrointestinal: Negative for heartburn, nausea, vomiting, abdominal pain, diarrhea, constipation, blood in stool and melena.  Genitourinary: Negative for hematuria.  Musculoskeletal: Positive for myalgias. Negative for falls.  Neurological: Positive for weakness.  Endo/Heme/Allergies: Does not bruise/bleed easily.  Psychiatric/Behavioral: Negative for depression. The patient is not nervous/anxious.   All other systems reviewed and are negative.   Labs:  Recent Labs  11/26/14 0240 11/26/14 0609  TROPONINI 4.20* 6.37*   Lab Results  Component Value Date   WBC 16.9* 11/26/2014   HGB 10.7* 11/26/2014   HCT 33.6* 11/26/2014   MCV 92.3 11/26/2014   PLT 175 11/26/2014     Recent Labs Lab 11/26/14 0240  NA 143  K 3.9  CL 106  CO2 30  BUN 17  CREATININE 1.08  CALCIUM 8.0*  PROT 6.3*  BILITOT 0.3  ALKPHOS 92  ALT 18  AST 46*  GLUCOSE 162*   No results found for: CHOL, HDL, LDLCALC, TRIG No results found for: DDIMER  Radiology/Studies:  Dg Chest Port 1 View  11/26/2014   CLINICAL DATA:  Shortness of breath. Progressive over the last 6 hours. Hypoxia and dyspnea.  EXAM: PORTABLE CHEST - 1 VIEW  COMPARISON:  Radiograph 10/20/2014, CT 11/07/2013  FINDINGS: Cardiomediastinal contours are normal. There is increasing left basilar opacity from prior exam. Unchanged linear right basilar opacity. No consolidation in the upper lung zones. Underlying emphysema. No pleural effusion or pneumothorax.  IMPRESSION: Increasing left basilar opacity, may reflect atelectasis or pneumonia. Unchanged opacity at the right lung base. Followup PA and lateral chest X-ray is recommended in 3-4 weeks following trial of  antibiotic therapy to ensure resolution and exclude underlying malignancy.   Electronically Signed   By: Rubye Oaks M.D.   On: 11/26/2014 02:05    EKG: sinus tachycardia, 144 bpm, left anterior fascicular block, inferolateral st/t changes   Weights: Filed Weights   11/26/14 0052  Weight: 144 lb (65.318 kg)     Physical Exam: Blood pressure 97/48, pulse 83, temperature 97.7 F (36.5 C), temperature source Oral, resp. rate 34, weight 144 lb (65.318 kg), SpO2 100 %. Body mass index is 24.74 kg/(m^2). General: Pale, well developed, well nourished, in no acute distress. Head: Normocephalic, atraumatic, sclera non-icteric, no xanthomas, nares are without discharge.  Neck: Negative for carotid bruits. JVD not elevated. Lungs: Tight breath sounds with bilateral wheezes. No rales or rhonchi. Breathing is unlabored. Heart: Distant heart sounds, RRR with S1 S2. No murmurs, rubs, or gallops appreciated. Abdomen: Soft, non-tender, non-distended with normoactive bowel sounds. No hepatomegaly. No rebound/guarding. No obvious abdominal masses. Msk:  Strength and tone appear normal for age. Extremities: No clubbing or cyanosis. No edema.  Distal pedal pulses are 2+ and equal bilaterally. Neuro: Alert and oriented X 3. No facial asymmetry. No focal deficit. Moves all extremities spontaneously. Psych:  Responds to questions appropriately with a normal affect.    Assessment and Plan:  74 y.o. male with h/o 1 vessel CAD managed medically by cardiac cath 2010, COPD, chronic respiratory failure on 3L nasal cannula at baseline, and BPH who was recently admitted to [facility] at the end of April into May with bilateral PNA, acute on chronic respiratory failure, and sepsis. He returns to [facility] on 5/31 with increased SOB, fever, sinus tachycardia, hypotension, hypoxia and was found to be septic with PNA and have an elevated troponin of 6.37.   1. NSTEMI/CAD: -Cardiac cath from 2010 showed single vessel disease  involving LCx, no intervention done at that time. Possible intervention to be considered if he had repeat symptoms -Patient then presented to our group 09/2014 with mildly elevated troponin of 0.18 in the setting of PNA, sepsis, and acute on chronic respiratory failure. He declined ischemic evaluation and intervention. Only evaluation he would agree to was echo -He returned to [facility] on 5/31 with NSTEMI and a troponin of 4.20-->6.37 -Continue heparin gtt, difficult to call demand ischemia given the elevation of his troponin  -He presented febrile with temperature of 102.4 which makes cardiac cath at this time less than ideal  -Would make patient NPO and reassess in the morning for possible cardiac cath, if he is agreeable to intervention  -Continue aspirin 81 mg  -Recent echo per  above, no need to repeat at this time  2. Acute on chronic respiratory failure with hypoxia and sepsis: -Patient meets 4/4 SIRS criteria  -Likely 2/2 PNA and COPD exacerbation -Cover for health care associated PNA with hospitalization within the past 90 days -Per IM -Consider ID consult for ABX coverage given recent hospitalization with PNA -Requiring 4L via nasal cannula  3. Hypotension: -Consider central line and pressors   4. Reported NSVT: -Maintaining sinus -BP precludes usage of beta blocker at this time   Valaria GoodSigned, Ryan Dunn, PA-C Pager: [phone] 11/26/2014, 12:53 PM

## 2014-11-27 ENCOUNTER — Encounter: Payer: Self-pay | Admitting: Physician Assistant

## 2014-11-27 DIAGNOSIS — I2510 Atherosclerotic heart disease of native coronary artery without angina pectoris: Secondary | ICD-10-CM | POA: Diagnosis present

## 2014-11-27 DIAGNOSIS — I249 Acute ischemic heart disease, unspecified: Secondary | ICD-10-CM

## 2014-11-27 DIAGNOSIS — I5189 Other ill-defined heart diseases: Secondary | ICD-10-CM | POA: Diagnosis present

## 2014-11-27 LAB — URINE CULTURE: CULTURE: NO GROWTH

## 2014-11-27 LAB — CBC
HCT: 30.8 % — ABNORMAL LOW (ref 40.0–52.0)
Hemoglobin: 9.6 g/dL — ABNORMAL LOW (ref 13.0–18.0)
MCH: 28.5 pg (ref 26.0–34.0)
MCHC: 31.4 g/dL — ABNORMAL LOW (ref 32.0–36.0)
MCV: 90.9 fL (ref 80.0–100.0)
Platelets: 132 K/uL — ABNORMAL LOW (ref 150–440)
RBC: 3.39 MIL/uL — ABNORMAL LOW (ref 4.40–5.90)
RDW: 15.7 % — ABNORMAL HIGH (ref 11.5–14.5)
WBC: 13.2 K/uL — ABNORMAL HIGH (ref 3.8–10.6)

## 2014-11-27 LAB — HEPARIN LEVEL (UNFRACTIONATED)
HEPARIN UNFRACTIONATED: 0.36 [IU]/mL (ref 0.30–0.70)
HEPARIN UNFRACTIONATED: 0.33 [IU]/mL (ref 0.30–0.70)

## 2014-11-27 LAB — MAGNESIUM: Magnesium: 2.1 mg/dL (ref 1.7–2.4)

## 2014-11-27 MED ORDER — SODIUM CHLORIDE 0.9 % IV SOLN: 250.0000 mL | INTRAVENOUS | Status: DC | PRN

## 2014-11-27 MED ORDER — ATORVASTATIN CALCIUM 20 MG PO TABS
40.0000 mg | ORAL_TABLET | Freq: Every day | ORAL | Status: DC
  Administered 2014-11-27 – 2014-11-28 (×2): 40 mg via ORAL
  Filled 2014-11-27 (×2): qty 2

## 2014-11-27 MED ORDER — METHYLPREDNISOLONE SODIUM SUCC 125 MG IJ SOLR
60.0000 mg | Freq: Every day | INTRAMUSCULAR | Status: DC
  Administered 2014-11-28 – 2014-11-29 (×2): 60 mg via INTRAVENOUS
  Filled 2014-11-27 (×2): qty 2

## 2014-11-27 MED ORDER — SODIUM CHLORIDE 0.9 % IJ SOLN
3.0000 mL | Freq: Two times a day (BID) | INTRAMUSCULAR | Status: DC
  Administered 2014-11-27: 3 mL via INTRAVENOUS

## 2014-11-27 MED ORDER — ASPIRIN 81 MG PO CHEW
81.0000 mg | CHEWABLE_TABLET | ORAL | Status: AC
  Administered 2014-11-28: 81 mg via ORAL
  Filled 2014-11-27: qty 1

## 2014-11-27 MED ORDER — IPRATROPIUM-ALBUTEROL 0.5-2.5 (3) MG/3ML IN SOLN: 3.0000 mL | RESPIRATORY_TRACT | Status: DC

## 2014-11-27 MED ORDER — SODIUM CHLORIDE 0.9 % WEIGHT BASED INFUSION
1.0000 mL/kg/h | INTRAVENOUS | Status: DC
  Administered 2014-11-28: 1 mL/kg/h via INTRAVENOUS

## 2014-11-27 MED ORDER — IPRATROPIUM BROMIDE 0.02 % IN SOLN: 0.5000 mg | Freq: Two times a day (BID) | RESPIRATORY_TRACT | Status: DC

## 2014-11-27 MED ORDER — SODIUM CHLORIDE 0.9 % WEIGHT BASED INFUSION
3.0000 mL/kg/h | INTRAVENOUS | Status: DC
Start: 2014-11-28 — End: 2014-11-28
  Administered 2014-11-28: 3 mL/kg/h via INTRAVENOUS

## 2014-11-27 MED ORDER — IPRATROPIUM-ALBUTEROL 0.5-2.5 (3) MG/3ML IN SOLN: 3.0000 mL | Freq: Four times a day (QID) | RESPIRATORY_TRACT | Status: DC | PRN

## 2014-11-27 MED ORDER — METOPROLOL TARTRATE 25 MG PO TABS
12.5000 mg | ORAL_TABLET | Freq: Two times a day (BID) | ORAL | Status: DC
  Administered 2014-11-27 – 2014-11-28 (×3): 12.5 mg via ORAL
  Filled 2014-11-27 (×3): qty 1

## 2014-11-27 MED ORDER — SODIUM CHLORIDE 0.9 % IJ SOLN
3.0000 mL | INTRAMUSCULAR | Status: DC | PRN
Start: 2014-11-27 — End: 2014-11-28

## 2014-11-27 NOTE — Progress Notes (Addendum)
ANTICOAGULATION CONSULT NOTE - Follow Up Consult  Pharmacy Consult for Heparin Indication: NSTEMI  No Known Allergies  Patient Measurements: Height: 5\' 4"  (162.6 cm) Weight: 148 lb 6.4 oz (67.314 kg) IBW/kg (Calculated) : 59.2 Heparin Dosing Weight: 67.3 kg  Vital Signs: Temp: 97.6 F (36.4 C) (06/02 0501) Temp Source: Oral (06/02 0501) BP: 117/70 mmHg (06/02 0501) Pulse Rate: 83 (06/02 0501)  Labs:  Recent Labs  11/26/14 0240 11/26/14 0609 11/26/14 0753 11/26/14 1306 11/26/14 1728 11/27/14 0324 11/27/14 1055  HGB 10.7*  --   --   --   --  9.6*  --   HCT 33.6*  --   --   --   --  30.8*  --   PLT 175  --   --   --   --  132*  --   APTT  --   --  27  --   --   --   --   LABPROT  --   --  13.5  --   --   --   --   INR  --   --  1.01  --   --   --   --   HEPARINUNFRC  --   --   --   --  0.74* 0.36 0.33  CREATININE 1.08  --   --   --   --   --   --   TROPONINI 4.20* 6.37*  --  7.26* 6.79*  --   --     Estimated Creatinine Clearance: 51 mL/min (by C-G formula based on Cr of 1.08).   Medications:  Scheduled:  . aspirin EC  81 mg Oral Daily  . atorvastatin  40 mg Oral q1800  . ipratropium  0.5 mg Nebulization Q6H  . [START ON 11/28/2014] methylPREDNISolone (SOLU-MEDROL) injection  60 mg Intravenous Daily  . metoprolol tartrate  12.5 mg Oral BID  . mometasone-formoterol  2 puff Inhalation BID  . pantoprazole  40 mg Oral Daily  . piperacillin-tazobactam (ZOSYN)  IV  3.375 g Intravenous 3 times per day  . vancomycin  1,000 mg Intravenous Q24H   Infusions:  . heparin 750 Units/hr (11/27/14 0600)   PRN: acetaminophen **OR** acetaminophen, ondansetron **OR** ondansetron (ZOFRAN) IV  Assessment: Heparin level is within desired range  Goal of Therapy:  Heparin level 0.3-0.7 units/ml Monitor platelets by anticoagulation protocol: Yes   Plan:  Anti-Xa level of 0.33 at 1100.  This level is within desired range of 0.3-0.7.  Continue heparin drip at rate of 750  units/hr.  Will recheck level at 0500 on 6/3.  CBC ordered with AM labs.  Pharmacy will continue to follow.  Clarisa Schoolsrystal Dayani Winbush, PharmD Clinical Pharmacist 11/27/2014   6/3 AM anti-Xa 0.22. 1000 unit bolus and increase rate to 900 units/hr. Recheck heparin level at 14:00.  Fulton ReekMatt McBane, PharmD, BCPS  11/28/2014

## 2014-11-27 NOTE — Progress Notes (Signed)
Patient: Keith Watts / Admit Date: 11/26/2014 / Date of Encounter: 11/27/2014, 8:50 AM   Subjective: Aerobic blood culture bottle growing Gram variable diplococci. On vancomycin and Zosyn. Continue SOB on 3L via nasal cannula. No further chest pain/left shoulder pain. Afebrile since admission. WBC 16.9-->13.2. Troponin 4.20-->6.37-->7.26-->6.79.    Review of Systems: Review of Systems  Constitutional: Positive for weight loss and malaise/fatigue. Negative for fever, chills and diaphoresis.       No fever within the past 24 hours  HENT: Negative for congestion and sore throat.        HOH  Eyes: Negative for photophobia, pain, discharge and redness.  Respiratory: Positive for cough, sputum production, shortness of breath and wheezing. Negative for hemoptysis.   Cardiovascular: Negative for chest pain, palpitations, orthopnea, claudication, leg swelling and PND.  Gastrointestinal: Negative for heartburn, nausea, vomiting, abdominal pain, diarrhea, constipation, blood in stool and melena.  Genitourinary: Negative for hematuria.  Musculoskeletal: Negative for myalgias, back pain, joint pain and falls.  Skin: Negative for itching and rash.  Neurological: Positive for weakness. Negative for sensory change, speech change and headaches.  Psychiatric/Behavioral: Negative for depression. The patient is not nervous/anxious.   All other systems reviewed and are negative.   Objective: Telemetry: sinus tachycardia, low 100's Physical Exam: Blood pressure 117/70, pulse 83, temperature 97.6 F (36.4 C), temperature source Oral, resp. rate 18, height  (1.626 m), weight 148 lb 6.4 oz (67.314 kg), SpO2 98 %. Body mass index is 25.46 kg/(m^2). General: Well developed, well nourished, in no acute distress. Head: Normocephalic, atraumatic, sclera non-icteric, no xanthomas, nares are without discharge. Neck: Negative for carotid bruits. JVP not elevated. Lungs: Tight breath sounds with bilateral  wheezes. No rales or rhonchi. Breathing is unlabored. On 3L via nasal cannula.  Heart: Distant heart sounds, RRR S1 S2 without murmurs, rubs, or gallops.  Abdomen: Soft, non-tender, non-distended with normoactive bowel sounds. No rebound/guarding. Extremities: No clubbing or cyanosis. No edema. Distal pedal pulses are 2+ and equal bilaterally. Neuro: Alert and oriented X 3. Moves all extremities spontaneously. Psych:  Responds to questions appropriately with a normal affect.   Intake/Output Summary (Last 24 hours) at 11/27/14 0850 Last data filed at 11/27/14 0752  Gross per 24 hour  Intake 1096.07 ml  Output   2300 ml  Net -1203.93 ml    Inpatient Medications:  . aspirin EC  81 mg Oral Daily  . ipratropium  0.5 mg Nebulization Q6H  . methylPREDNISolone (SOLU-MEDROL) injection  60 mg Intravenous Q6H  . mometasone-formoterol  2 puff Inhalation BID  . pantoprazole  40 mg Oral Daily  . piperacillin-tazobactam (ZOSYN)  IV  3.375 g Intravenous 3 times per day  . vancomycin  1,000 mg Intravenous Q24H   Infusions:  . heparin 750 Units/hr (11/27/14 0600)    Labs:  Recent Labs  11/26/14 0240  NA 143  K 3.9  CL 106  CO2 30  GLUCOSE 162*  BUN 17  CREATININE 1.08  CALCIUM 8.0*    Recent Labs  11/26/14 0240  AST 46*  ALT 18  ALKPHOS 92  BILITOT 0.3  PROT 6.3*  ALBUMIN 2.7*    Recent Labs  11/26/14 0240 11/27/14 0324  WBC 16.9* 13.2*  NEUTROABS 13.5*  --   HGB 10.7* 9.6*  HCT 33.6* 30.8*  MCV 92.3 90.9  PLT 175 132*    Recent Labs  11/26/14 0240 11/26/14 0609 11/26/14 1306 11/26/14 1728  TROPONINI 4.20* 6.37* 7.26* 6.79*  Invalid input(s): POCBNP No results for input(s): HGBA1C in the last 72 hours.   Weights: Filed Weights   11/26/14 0052 11/26/14 1834 11/27/14 0501  Weight: 144 lb (65.318 kg) 148 lb 14.4 oz (67.541 kg) 148 lb 6.4 oz (67.314 kg)     Radiology/Studies:  Dg Chest Port 1 View  11/26/2014   CLINICAL DATA:  Shortness of breath.  Progressive over the last 6 hours. Hypoxia and dyspnea.  EXAM: PORTABLE CHEST - 1 VIEW  COMPARISON:  Radiograph 10/20/2014, CT 11/07/2013  FINDINGS: Cardiomediastinal contours are normal. There is increasing left basilar opacity from prior exam. Unchanged linear right basilar opacity. No consolidation in the upper lung zones. Underlying emphysema. No pleural effusion or pneumothorax.  IMPRESSION: Increasing left basilar opacity, may reflect atelectasis or pneumonia. Unchanged opacity at the right lung base. Followup PA and lateral chest X-ray is recommended in 3-4 weeks following trial of antibiotic therapy to ensure resolution and exclude underlying malignancy.   Electronically Signed   By: Rubye OaksMelanie  Ehinger M.D.   On: 11/26/2014 02:05     Assessment and Plan  74 y.o. male with h/o 1 vessel CAD managed medically by cardiac cath 2010, COPD, chronic respiratory failure on 3L nasal cannula at baseline, and BPH who was [facility] at the end of April into May with bilateral PNA, acute on chronic respiratory failure, and sepsis. [facility] with increased SOB, fever, sinus tachycardia, hypotension, hypoxia and was found to be septic with PNA and have an elevated troponin of 6.37.   1. NSTEMI/CAD: -Cardiac cath from 2010 showed single vessel disease involving LCx, no intervention done at that time. Possible intervention to be considered if he had repeat symptoms -Patient then presented to our group 09/2014 with mildly elevated troponin of 0.18 in the setting of PNA, sepsis, and acute on chronic respiratory failure. He declined ischemic evaluation and intervention. Only evaluation he would agree to was echo -He returned [facility] on 5/31 with NSTEMI and a troponin of 4.20-->6.37-->7.26-->6.79 -He may very well have had a primary cardiac event at this time, that being a NSTEMI, however he was found to be hypotensive, febrile, and meet 4/4 SIRS criteria upon presentation coupled with CXR  that had concerns for repeat health care associated PNA. These precluded cardiac cath at the time of consult. On hospital day 2 he was found to be bacteremic with Gram variable diplococci in aerobic blood culture bottle which further precludes cardic cath until infection is resolved -It was discussed with him he does in fact need a cardiac cath and he is agreeable to this, though he will need to clear this infection prior to any PCI  -Continue heparin gtt for a total of 48 hours (has received 24 hours so far) -Could start Plavix 75 mg with monitoring of hgb  -Will start low dose Lopressor 12.5 mg bid with hold parameters  -Start Lipitor 40 mg daily  -Can cancel NPO  -Continue aspirin 81 mg  -Recent echo per above, no need to repeat at this time  2. Acute on chronic respiratory failure with hypoxia, bacteremia, and sepsis: -Patient meets 4/4 SIRS criteria  -Likely 2/2 PNA and COPD exacerbation -Cover for health care associated PNA with hospitalization within the past 90 days -Per IM -Consider ID consult for ABX coverage given recent hospitalization with PNA -Requiring 3L via nasal cannula currently   3. Hypotension: -Improved  4. Acute on chronic anemia: -HGB down to 9.6, baseline appears to be  13 -Presented with hgb of 10.7 -Possible dilution   5. Reported NSVT: -Maintaining sinus -BP precludes usage of beta blocker at this time   Valaria Good Pager: [phone] 11/27/2014, 8:50 AM

## 2014-11-27 NOTE — Progress Notes (Signed)
ANTIBIOTIC CONSULT NOTE - INITIAL  Pharmacy Consult for Vancomycin/Zosyn Indication: pneumonia  No Known Allergies  Patient Measurements: Height:  (162.6 cm) Weight: 148 lb 6.4 oz (67.314 kg) IBW/kg (Calculated) : 59.2 Adjusted Body Weight: 62.4 kg  Vital Signs: Temp: 97.6 F (36.4 C) (06/02 1126) Temp Source: Oral (06/02 1126) BP: 140/66 mmHg (06/02 1126) Pulse Rate: 92 (06/02 1126) Intake/Output from previous day: 06/01 0701 - 06/02 0700 In: 1096.1 [I.V.:821.1; IV Piggyback:275] Out: 2300 [Urine:2300] Intake/Output from this shift: Total I/O In: 240 [P.O.:240] Out: 700 [Urine:700]  Labs:  Recent Labs  11/26/14 0240 11/27/14 0324  WBC 16.9* 13.2*  HGB 10.7* 9.6*  PLT 175 132*  CREATININE 1.08  --    Estimated Creatinine Clearance: 51 mL/min (by C-G formula based on Cr of 1.08). No results for input(s): VANCOTROUGH, VANCOPEAK, VANCORANDOM, GENTTROUGH, GENTPEAK, GENTRANDOM, TOBRATROUGH, TOBRAPEAK, TOBRARND, AMIKACINPEAK, AMIKACINTROU, AMIKACIN in the last 72 hours.   Microbiology: Recent Results (from the past 720 hour(s))  Blood Culture (routine x 2)     Status: None (Preliminary result)   Collection Time: 11/26/14 12:54 AM  Result Value Ref Range Status   Specimen Description BLOOD  Final   Special Requests NONE  Final   Culture   Final    VIRIDANS STREPTOCOCCUS CRITICAL RESULT CALLED TO, READ BACK BY AND VERIFIED WITH: YASMIN SARANO @ 2310 6.1.16 MPG AEROBIC BOTTLE ONLY POSSIBLE CONTAMINATION WITH SKIN FLORA    Report Status PENDING  Incomplete  Urine culture     Status: None   Collection Time: 11/26/14  2:35 AM  Result Value Ref Range Status   Specimen Description URINE, CLEAN CATCH  Final   Special Requests NONE  Final   Culture NO GROWTH  Final   Report Status 11/27/2014 FINAL  Final  Blood Culture (routine x 2)     Status: None (Preliminary result)   Collection Time: 11/26/14  3:22 AM  Result Value Ref Range Status   Specimen Description  BLOOD  Final   Special Requests NONE  Final   Culture NO GROWTH < 12 HOURS  Final   Report Status PENDING  Incomplete    Medical History: Past Medical History  Diagnosis Date  . COPD (chronic obstructive pulmonary disease)   . Pneumonia     a. admission to [facility] 09/2014 & 11/2014  . CAD (coronary artery disease)     a. 1 vessel CAD by cardiac cath in 2010 managed medically   . Chronic respiratory failure     a. on 3L O2 via nasal cannula  . BPH (benign prostatic hyperplasia)   . Diastolic dysfunction     a. echo 09/2014: EF 50-55%, impaired relaxation of LV diastolic filling, nl RV size & sys fxn, mildly dilated LA, mild MR, mild Ao stenosis, nl PASP    Medications:  Scheduled:  . aspirin EC  81 mg Oral Daily  . atorvastatin  40 mg Oral q1800  . ipratropium  0.5 mg Nebulization Q6H  . [START ON 11/28/2014] methylPREDNISolone (SOLU-MEDROL) injection  60 mg Intravenous Daily  . metoprolol tartrate  12.5 mg Oral BID  . mometasone-formoterol  2 puff Inhalation BID  . pantoprazole  40 mg Oral Daily  . piperacillin-tazobactam (ZOSYN)  IV  3.375 g Intravenous 3 times per day  . vancomycin  1,000 mg Intravenous Q24H   Infusions:  . heparin 750 Units/hr (11/27/14 0600)   Assessment: Patient is a 74 yo male admitted for acute on chronic respiratory failure.  Patient is receiving  empiric antibiotic therapy with Vancomycin and Zosyn IV.  Est CrCl~51 mL/min, ke: 0.048, t1/2: 14.4 h, Vd: 43.7 L  Goal of Therapy:  Eradication of infection  Goal trough of 15-20. Renal adjustment to Zosyn when warranted  Plan:  Will continue current orders for Vancomycin 1 gm IV q24h.  Will check a trough prior to third dose on 6/3 at 0930 (should be at steady state). Goal trough 15-20.  Continue Zosyn 3.375 gm IV q8h per EI protocol.   Follow up blood cultures.  Pharmacy will continue to follow.  Clarisa Schoolsrystal Bion Todorov, PharmD Clinical Pharmacist 11/27/2014

## 2014-11-27 NOTE — Progress Notes (Signed)
[facility] - [place] at [facility]   PATIENT NAME: Keith Watts    MR#:  [id number]  DATE OF BIRTH:  Sep 12, 1940  SUBJECTIVE:   Came in with increasing sob and cough. Was found to have NSTEMI and pneumonia Feels some better today. "rattly cough" REVIEW OF SYSTEMS:    Review of Systems  Constitutional: Negative for fever, chills and weight loss.  HENT: Negative for ear discharge, ear pain and nosebleeds.   Eyes: Negative for blurred vision, pain and discharge.  Respiratory: Negative for sputum production, shortness of breath, wheezing and stridor.   Cardiovascular: Negative for chest pain, palpitations, orthopnea and PND.  Gastrointestinal: Negative for nausea, vomiting, abdominal pain and diarrhea.  Genitourinary: Negative for urgency and frequency.  Musculoskeletal: Negative for back pain and joint pain.  Neurological: Negative for sensory change, speech change, focal weakness and weakness.  Psychiatric/Behavioral: Negative for depression. The patient is not nervous/anxious.     Tolerating Diet:yes Tolerating PT: eval pending  DRUG ALLERGIES:  No Known Allergies  VITALS:  Blood pressure 140/66, pulse 92, temperature 97.6 F (36.4 C), temperature source Oral, resp. rate 18, height  (1.626 m), weight 67.314 kg (148 lb 6.4 oz), SpO2 99 %.  PHYSICAL EXAMINATION:   Physical Exam  Constitutional: He is oriented to person, place, and time and well-developed, well-nourished, and in no distress.  HENT:  Head: Atraumatic.  Nose: Nose normal.  Mouth/Throat: Oropharynx is clear and moist.  Eyes: EOM are normal. Pupils are equal, round, and reactive to light.  Neck: Normal range of motion. Neck supple. No thyromegaly present.  Cardiovascular: Normal rate, regular rhythm and normal heart sounds.   Pulmonary/Chest: Effort normal. No respiratory distress. He has wheezes. He has rales.  Coarse BS  Abdominal: Soft. Bowel sounds are normal. There is no  tenderness. There is no rebound.  Musculoskeletal: Normal range of motion. He exhibits edema. He exhibits no tenderness.  Neurological: He is alert and oriented to person, place, and time. He displays normal reflexes. No cranial nerve deficit. He exhibits normal muscle tone. Coordination normal.  Skin: Skin is warm. No rash noted. No erythema.  Psychiatric: Memory and affect normal.   LABORATORY PANEL:   CBC  Recent Labs Lab 11/27/14 0324  WBC 13.2*  HGB 9.6*  HCT 30.8*  PLT 132*    Chemistries   Recent Labs Lab 11/26/14 0240 11/27/14 1055  NA 143  --   K 3.9  --   CL 106  --   CO2 30  --   GLUCOSE 162*  --   BUN 17  --   CREATININE 1.08  --   CALCIUM 8.0*  --   MG  --  2.1  AST 46*  --   ALT 18  --   ALKPHOS 92  --   BILITOT 0.3  --    Cardiac Enzymes  Recent Labs Lab 11/26/14 1728  TROPONINI 6.79*   ------------------------------------------------------------------------------------------------------------------  RADIOLOGY:  Dg Chest Port 1 View  11/26/2014   CLINICAL DATA:  Shortness of breath. Progressive over the last 6 hours. Hypoxia and dyspnea.  EXAM: PORTABLE CHEST - 1 VIEW  COMPARISON:  Radiograph 10/20/2014, CT 11/07/2013  FINDINGS: Cardiomediastinal contours are normal. There is increasing left basilar opacity from prior exam. Unchanged linear right basilar opacity. No consolidation in the upper lung zones. Underlying emphysema. No pleural effusion or pneumothorax.  IMPRESSION: Increasing left basilar opacity, may reflect atelectasis or pneumonia. Unchanged opacity at the right lung base. Followup  PA and lateral chest X-ray is recommended in 3-4 weeks following trial of antibiotic therapy to ensure resolution and exclude underlying malignancy.   Electronically Signed   By: Rubye OaksMelanie  Ehinger M.D.   On: 11/26/2014 02:05   ASSESSMENT AND PLAN:  74 y.o. male with a known history of COPD on home oxygen 3 L/m, [facility] with [facility]  presents to the emergency room with the complaints of acute onset of shortness of breath leading to severe respiratory distress.  1. Acute on chronic respiratory failure with hypoxia secondary to combination of left basilar pneumonia and COPD exacerbation. -IV solumedrol, nebs, oxygen -IV Vanc and zosyn (shortage of cefepime) -1/4BC  Gram variable diplococci likely contamination - sputum cx pending  2. Sepsis secondary to left basilar pneumonia, [facility] with pneumonia. HCAP. -bp stable. Not on any pressors  3. Acute NSTEMI with  history of CAD 1 vessel  heart disease by cath in 2010 -Pt on IV heparin gtt -spoke with Dr Kirke CorinArida and plans for Heart cath in am -cont asa, nitro prn,metoprolol -cont statins  4. Arrhythmia  -pt had 11 second run of V. tach while in the ED resolved spontaneously. Patient maintaining sinus rhythm.  5. COPD with acute exacerbation. -rx as above  6.BPH   Case discussed with Care Management/Social Worker. Management plans discussed with the patient and  in agreement.  CODE STATUS: FULL  DVT Prophylaxis: on heparin gtt  TOTAL TIME TAKING CARE OF THIS PATIENT: 35  minutes.   POSSIBLE D/C IN 2-3DAYS, DEPENDING ON CLINICAL CONDITION.   Harman Langhans M.D on 11/27/2014 at 11:29 AM  Between 7am to 6pm - Pager - [phone]  After 6pm go to [email or web address] - password [email or web address] [facility]  [facility]  Office  [phone]  CC: Primary care physician; Mila Merryonald Fisher, MD

## 2014-11-27 NOTE — Care Management (Addendum)
[facility] with [facility].  Was assessed by this CM during that admission and found to have home 02 but patient not sure of agency that provides.  Made recommendation that patient received home health nursing and physical therapy but does not appear home health was ordered.  Admitted with nstemi and sepsis due to pneumonia.  Initially required bipap.   He was transfererd out of ICU 6/2.  There is no order present for PT.  Spoke with patient and he was very firm in his decision that he wishes to return home and not to consider skilled nursing. Says he has only been up to bsc since admission.  Lives with his wife.  Patient appears very dyspneic with 02 and with conversation.  He is very hard of hearing and difficult for CM to determine if he has any other family support and home equipment- such as bsc, walker.  Spoke with primary nurse regarding functional status.  Cardiac cath pending results of next set of blood cultures

## 2014-11-28 ENCOUNTER — Encounter
Admission: EM | Disposition: A | Discharge status: Home Health | Payer: Self-pay | Source: Home / Self Care | Attending: Internal Medicine

## 2014-11-28 ENCOUNTER — Encounter: Payer: Self-pay | Admitting: Physician Assistant

## 2014-11-28 DIAGNOSIS — R778 Other specified abnormalities of plasma proteins: Secondary | ICD-10-CM | POA: Insufficient documentation

## 2014-11-28 DIAGNOSIS — I214 Non-ST elevation (NSTEMI) myocardial infarction: Secondary | ICD-10-CM | POA: Insufficient documentation

## 2014-11-28 DIAGNOSIS — I472 Ventricular tachycardia: Secondary | ICD-10-CM

## 2014-11-28 DIAGNOSIS — D649 Anemia, unspecified: Secondary | ICD-10-CM

## 2014-11-28 DIAGNOSIS — I2510 Atherosclerotic heart disease of native coronary artery without angina pectoris: Secondary | ICD-10-CM

## 2014-11-28 DIAGNOSIS — R7989 Other specified abnormal findings of blood chemistry: Secondary | ICD-10-CM

## 2014-11-28 HISTORY — PX: CARDIAC CATHETERIZATION: SHX172

## 2014-11-28 LAB — BASIC METABOLIC PANEL
Anion gap: 4 — ABNORMAL LOW (ref 5–15)
BUN: 25 mg/dL — ABNORMAL HIGH (ref 6–20)
CO2: 34 mmol/L — ABNORMAL HIGH (ref 22–32)
Calcium: 8.7 mg/dL — ABNORMAL LOW (ref 8.9–10.3)
Chloride: 107 mmol/L (ref 101–111)
Creatinine, Ser: 1.01 mg/dL (ref 0.61–1.24)
GFR calc Af Amer: >60 mL/min (ref >60)
GFR calc non Af Amer: >60 mL/min (ref >60)
Glucose, Bld: 109 mg/dL — ABNORMAL HIGH (ref 65–99)
POTASSIUM: 4.7 mmol/L (ref 3.5–5.1)
SODIUM: 145 mmol/L (ref 135–145)

## 2014-11-28 LAB — CREATININE, SERUM
Creatinine, Ser: 0.95 mg/dL (ref 0.61–1.24)
GFR calc Af Amer: >60 mL/min (ref >60)

## 2014-11-28 LAB — CBC
HCT: 33.1 % — ABNORMAL LOW (ref 40.0–52.0)
HEMATOCRIT: 31.1 % — AB (ref 40.0–52.0)
HEMOGLOBIN: 9.7 g/dL — AB (ref 13.0–18.0)
Hemoglobin: 10.5 g/dL — ABNORMAL LOW (ref 13.0–18.0)
MCH: 28.6 pg (ref 26.0–34.0)
MCH: 28.7 pg (ref 26.0–34.0)
MCHC: 31.2 g/dL — ABNORMAL LOW (ref 32.0–36.0)
MCHC: 31.8 g/dL — ABNORMAL LOW (ref 32.0–36.0)
MCV: 91.6 fL (ref 80.0–100.0)
MCV: 90.4 fL (ref 80.0–100.0)
PLATELETS: 164 10*3/uL (ref 150–440)
Platelets: 198 10*3/uL (ref 150–440)
RBC: 3.39 MIL/uL — ABNORMAL LOW (ref 4.40–5.90)
RBC: 3.66 MIL/uL — AB (ref 4.40–5.90)
RDW: 16.5 % — ABNORMAL HIGH (ref 11.5–14.5)
RDW: 16.3 % — AB (ref 11.5–14.5)
WBC: 14 10*3/uL — AB (ref 3.8–10.6)
WBC: 13.3 10*3/uL — AB (ref 3.8–10.6)

## 2014-11-28 LAB — VANCOMYCIN, TROUGH: Vancomycin Tr: 57 ug/mL (ref 10–20)

## 2014-11-28 LAB — HEPARIN LEVEL (UNFRACTIONATED): HEPARIN UNFRACTIONATED: 0.22 [IU]/mL — AB (ref 0.30–0.70)

## 2014-11-28 SURGERY — LEFT HEART CATH AND CORONARY ANGIOGRAPHY
Anesthesia: Moderate Sedation

## 2014-11-28 MED ORDER — METOPROLOL TARTRATE 25 MG PO TABS
25.0000 mg | ORAL_TABLET | Freq: Two times a day (BID) | ORAL | Status: DC
  Administered 2014-11-28 – 2014-11-29 (×2): 25 mg via ORAL
  Filled 2014-11-28 (×2): qty 1

## 2014-11-28 MED ORDER — HEPARIN (PORCINE) IN NACL 2-0.9 UNIT/ML-% IJ SOLN
INTRAMUSCULAR | Status: AC
  Filled 2014-11-28: qty 1000

## 2014-11-28 MED ORDER — HEPARIN SODIUM (PORCINE) 1000 UNIT/ML IJ SOLN
INTRAMUSCULAR | Status: DC | PRN
  Administered 2014-11-28: 3000 [IU] via INTRAVENOUS

## 2014-11-28 MED ORDER — SODIUM CHLORIDE 0.9 % IV SOLN: 250.0000 mL | INTRAVENOUS | Status: DC | PRN

## 2014-11-28 MED ORDER — SODIUM CHLORIDE 0.9 % IJ SOLN: 3.0000 mL | INTRAMUSCULAR | Status: DC | PRN

## 2014-11-28 MED ORDER — FUROSEMIDE 10 MG/ML IJ SOLN
INTRAMUSCULAR | Status: DC | PRN
Start: 2014-11-28 — End: 2014-11-28
  Administered 2014-11-28: 40 mg via INTRAVENOUS

## 2014-11-28 MED ORDER — HEPARIN BOLUS VIA INFUSION
1000.0000 [IU] | Freq: Once | INTRAVENOUS | Status: AC
  Administered 2014-11-28: 1000 [IU] via INTRAVENOUS
  Filled 2014-11-28: qty 1000

## 2014-11-28 MED ORDER — MIDAZOLAM HCL 2 MG/2ML IJ SOLN
INTRAMUSCULAR | Status: AC
  Filled 2014-11-28: qty 2

## 2014-11-28 MED ORDER — CLOPIDOGREL BISULFATE 75 MG PO TABS
ORAL_TABLET | ORAL | Status: AC
  Filled 2014-11-28: qty 6

## 2014-11-28 MED ORDER — BIVALIRUDIN 250 MG IV SOLR
INTRAVENOUS | Status: AC
  Filled 2014-11-28: qty 250

## 2014-11-28 MED ORDER — NITROGLYCERIN 5 MG/ML IV SOLN
INTRAVENOUS | Status: AC
  Filled 2014-11-28: qty 10

## 2014-11-28 MED ORDER — ENOXAPARIN SODIUM 40 MG/0.4ML ~~LOC~~ SOLN
40.0000 mg | SUBCUTANEOUS | Status: DC
  Administered 2014-11-29: 40 mg via SUBCUTANEOUS
  Filled 2014-11-28: qty 0.4

## 2014-11-28 MED ORDER — FENTANYL CITRATE (PF) 100 MCG/2ML IJ SOLN
INTRAMUSCULAR | Status: AC
  Filled 2014-11-28: qty 2

## 2014-11-28 MED ORDER — SODIUM CHLORIDE 0.9 % IJ SOLN: 3.0000 mL | Freq: Two times a day (BID) | INTRAMUSCULAR | Status: DC

## 2014-11-28 MED ORDER — ASPIRIN 81 MG PO CHEW
CHEWABLE_TABLET | ORAL | Status: DC | PRN
  Administered 2014-11-28: 243 mg via ORAL

## 2014-11-28 MED ORDER — IPRATROPIUM-ALBUTEROL 20-100 MCG/ACT IN AERS
1.0000 | INHALATION_SPRAY | Freq: Four times a day (QID) | RESPIRATORY_TRACT | Status: DC | PRN
  Filled 2014-11-28: qty 4

## 2014-11-28 MED ORDER — FUROSEMIDE 40 MG PO TABS
40.0000 mg | ORAL_TABLET | Freq: Every day | ORAL | Status: DC
  Administered 2014-11-29: 40 mg via ORAL
  Filled 2014-11-28: qty 1

## 2014-11-28 MED ORDER — FINASTERIDE 5 MG PO TABS
5.0000 mg | ORAL_TABLET | Freq: Every day | ORAL | Status: DC
  Administered 2014-11-28: 5 mg via ORAL
  Filled 2014-11-28: qty 1

## 2014-11-28 MED ORDER — IOHEXOL 300 MG/ML  SOLN
INTRAMUSCULAR | Status: DC | PRN
  Administered 2014-11-28: 25 mL via INTRAVENOUS
  Administered 2014-11-28: 80 mL via INTRAVENOUS
  Administered 2014-11-28: 25 mL via INTRAVENOUS
  Administered 2014-11-28: 75 mL via INTRAVENOUS

## 2014-11-28 MED ORDER — CLOPIDOGREL BISULFATE 75 MG PO TABS
75.0000 mg | ORAL_TABLET | Freq: Every day | ORAL | Status: DC
  Administered 2014-11-29: 75 mg via ORAL
  Filled 2014-11-28: qty 1

## 2014-11-28 MED ORDER — BIVALIRUDIN BOLUS VIA INFUSION - CUPID
INTRAVENOUS | Status: DC | PRN
  Administered 2014-11-28: 50.025 mg via INTRAVENOUS

## 2014-11-28 MED ORDER — FUROSEMIDE 10 MG/ML IJ SOLN
INTRAMUSCULAR | Status: AC
  Filled 2014-11-28: qty 4

## 2014-11-28 MED ORDER — SODIUM CHLORIDE 0.9 % IV SOLN
250.0000 mg | INTRAVENOUS | Status: DC | PRN
  Administered 2014-11-28: 1.75 mg/kg/h via INTRAVENOUS

## 2014-11-28 MED ORDER — SODIUM CHLORIDE 0.9 % IJ SOLN
3.0000 mL | Freq: Two times a day (BID) | INTRAMUSCULAR | Status: DC
  Administered 2014-11-28 – 2014-11-29 (×2): 3 mL via INTRAVENOUS

## 2014-11-28 MED ORDER — NITROGLYCERIN 1 MG/10 ML FOR IR/CATH LAB
INTRA_ARTERIAL | Status: DC | PRN
  Administered 2014-11-28 (×2): 200 ug via INTRACORONARY

## 2014-11-28 MED ORDER — LEVOFLOXACIN 500 MG PO TABS
500.0000 mg | ORAL_TABLET | Freq: Every day | ORAL | Status: DC
  Administered 2014-11-29: 500 mg via ORAL
  Filled 2014-11-28: qty 1

## 2014-11-28 MED ORDER — HEPARIN SODIUM (PORCINE) 1000 UNIT/ML IJ SOLN
INTRAMUSCULAR | Status: AC
  Filled 2014-11-28: qty 1

## 2014-11-28 MED ORDER — VERAPAMIL HCL 2.5 MG/ML IV SOLN
INTRAVENOUS | Status: AC
  Filled 2014-11-28: qty 2

## 2014-11-28 MED ORDER — CLOPIDOGREL BISULFATE 75 MG PO TABS
ORAL_TABLET | ORAL | Status: AC
  Filled 2014-11-28: qty 2

## 2014-11-28 MED ORDER — CLOPIDOGREL BISULFATE 75 MG PO TABS
ORAL_TABLET | ORAL | Status: DC | PRN
Start: 2014-11-28 — End: 2014-11-28
  Administered 2014-11-28: 600 mg via ORAL

## 2014-11-28 MED ORDER — ASPIRIN 81 MG PO CHEW
CHEWABLE_TABLET | ORAL | Status: AC
  Filled 2014-11-28: qty 3

## 2014-11-28 SURGICAL SUPPLY — 13 items
BALLN TREK RX 2.5X12 (BALLOONS) ×3
BALLOON TREK RX 2.5X12 (BALLOONS) ×1 IMPLANT
CATH INFINITI 5FR ANG PIGTAIL (CATHETERS) ×3 IMPLANT
CATH OPTITORQUE JACKY 4.0 5F (CATHETERS) ×3 IMPLANT
CATH VISTA GUIDE 6FR XB3 (CATHETERS) ×3 IMPLANT
DEVICE INFLAT 30 PLUS (MISCELLANEOUS) ×3 IMPLANT
DEVICE RAD TR BAND REGULAR (VASCULAR PRODUCTS) ×3 IMPLANT
GLIDESHEATH SLEND SS 6F .021 (SHEATH) ×3 IMPLANT
KIT MANI 3VAL PERCEP (MISCELLANEOUS) ×3 IMPLANT
PACK CARDIAC CATH (CUSTOM PROCEDURE TRAY) ×3 IMPLANT
STENT XIENCE ALPINE RX 2.75X18 (Permanent Stent) ×3 IMPLANT
WIRE INTUITION PROPEL ST 180CM (WIRE) ×3 IMPLANT
WIRE SAFE-T 1.5MM-J .035X260CM (WIRE) ×3 IMPLANT

## 2014-11-28 NOTE — Progress Notes (Signed)
PT Cancellation Note  Patient Details Name: Keith NapoleonHenry Harmon Watts MRN: [id number] DOB: April 23, 1941   Cancelled Treatment:    Reason Eval/Treat Not Completed: Patient at procedure or test/unavailable. Patient at cardiac cath currently. PT will continue to follow and re-attempt when available.   Kerin RansomPatrick A Teralyn Mullins, PT, DPT   11/28/2014, 11:46 AM

## 2014-11-28 NOTE — Progress Notes (Addendum)
  Patient: Keith Watts / Admit Date: 11/26/2014 / Date of Encounter: 11/28/2014, 7:59 AM   Subjective: He is for cardiac cath today. Continued SOB with cough. Positive blood culture with strep viridans (1/4), likely contaminant. BP stable. Afebrile. Still with leukocytosis. No chest pain.     Review of Systems: Review of Systems  Constitutional: Positive for weight loss and malaise/fatigue. Negative for fever, chills and diaphoresis.  HENT:       HOH  Eyes: Negative for pain, discharge and redness.  Respiratory: Positive for cough, sputum production, shortness of breath and wheezing. Negative for hemoptysis.   Cardiovascular: Negative for chest pain, palpitations, orthopnea, claudication, leg swelling and PND.  Gastrointestinal: Negative for heartburn, nausea, vomiting, abdominal pain, diarrhea, constipation, blood in stool and melena.  Genitourinary: Negative for hematuria.  Musculoskeletal: Positive for myalgias. Negative for falls.  Skin: Negative for rash.  Neurological: Positive for weakness. Negative for sensory change, speech change, focal weakness and headaches.  Psychiatric/Behavioral: The patient is not nervous/anxious.      Objective: Telemetry: NSR, low 100's Physical Exam: Blood pressure 131/71, pulse 96, temperature 97.6 F (36.4 C), temperature source Oral, resp. rate 20, height 5' 4" (1.626 m), weight 147 lb (66.679 kg), SpO2 91 %. Body mass index is 25.22 kg/(m^2). General: Well developed, well nourished, in no acute distress. Head: Normocephalic, atraumatic, sclera non-icteric, no xanthomas, nares are without discharge. Neck: Negative for carotid bruits. JVP not elevated. Lungs: Continued tight breath sounds with bilateral wheezing and rhonchi. No rales. Breathing is unlabored. Heart: Distant heart sounds. RRR S1 S2 without murmurs, rubs, or gallops.  Abdomen: Soft, non-tender, non-distended with normoactive bowel sounds. No rebound/guarding. Extremities: No  clubbing or cyanosis. No edema. Distal pedal pulses are 2+ and equal bilaterally. Neuro: Alert and oriented X 3. Moves all extremities spontaneously. Psych:  Responds to questions appropriately with a normal affect.   Intake/Output Summary (Last 24 hours) at 11/28/14 0759 Last data filed at 11/28/14 0438  Gross per 24 hour  Intake 875.13 ml  Output   2150 ml  Net -1274.87 ml    Inpatient Medications:  . aspirin EC  81 mg Oral Daily  . atorvastatin  40 mg Oral q1800  . methylPREDNISolone (SOLU-MEDROL) injection  60 mg Intravenous Daily  . metoprolol tartrate  12.5 mg Oral BID  . mometasone-formoterol  2 puff Inhalation BID  . pantoprazole  40 mg Oral Daily  . piperacillin-tazobactam (ZOSYN)  IV  3.375 g Intravenous 3 times per day  . sodium chloride  3 mL Intravenous Q12H  . vancomycin  1,000 mg Intravenous Q24H   Infusions:  . sodium chloride 1 mL/kg/hr (11/28/14 0730)  . heparin 900 Units/hr (11/28/14 0609)    Labs:  Recent Labs  11/26/14 0240 11/27/14 1055 11/28/14 0424  NA 143  --  145  K 3.9  --  4.7  CL 106  --  107  CO2 30  --  34*  GLUCOSE 162*  --  109*  BUN 17  --  25*  CREATININE 1.08  --  1.01  CALCIUM 8.0*  --  8.7*  MG  --  2.1  --     Recent Labs  11/26/14 0240  AST 46*  ALT 18  ALKPHOS 92  BILITOT 0.3  PROT 6.3*  ALBUMIN 2.7*    Recent Labs  11/26/14 0240 11/27/14 0324 11/28/14 0424  WBC 16.9* 13.2* 14.0*  NEUTROABS 13.5*  --   --   HGB 10.7* 9.6* 9.7*    HCT 33.6* 30.8* 31.1*  MCV 92.3 90.9 91.6  PLT 175 132* 164    Recent Labs  11/26/14 0240 11/26/14 0609 11/26/14 1306 11/26/14 1728  TROPONINI 4.20* 6.37* 7.26* 6.79*   Invalid input(s): POCBNP No results for input(s): HGBA1C in the last 72 hours.   Weights: Filed Weights   11/26/14 1834 11/27/14 0501 11/28/14 0430  Weight: 148 lb 14.4 oz (67.541 kg) 148 lb 6.4 oz (67.314 kg) 147 lb (66.679 kg)     Radiology/Studies:  Dg Chest Port 1 View  11/26/2014   CLINICAL  DATA:  Shortness of breath. Progressive over the last 6 hours. Hypoxia and dyspnea.  EXAM: PORTABLE CHEST - 1 VIEW  COMPARISON:  Radiograph 10/20/2014, CT 11/07/2013  FINDINGS: Cardiomediastinal contours are normal. There is increasing left basilar opacity from prior exam. Unchanged linear right basilar opacity. No consolidation in the upper lung zones. Underlying emphysema. No pleural effusion or pneumothorax.  IMPRESSION: Increasing left basilar opacity, may reflect atelectasis or pneumonia. Unchanged opacity at the right lung base. Followup PA and lateral chest X-ray is recommended in 3-4 weeks following trial of antibiotic therapy to ensure resolution and exclude underlying malignancy.   Electronically Signed   By: Melanie  Ehinger M.D.   On: 11/26/2014 02:05     Assessment and Plan  74 y.o. male with h/o 1 vessel CAD managed medically by cardiac cath 2010, COPD, chronic respiratory failure on 3L nasal cannula at baseline, and BPH who was recently admitted to [facility] at the end of April into May with bilateral PNA, acute on chronic respiratory failure, and sepsis. He returns to [facility] on 5/31 with increased SOB, fever, sinus tachycardia, hypotension, hypoxia and was found to be septic with PNA and have an elevated troponin of 6.37.   1. NSTEMI/CAD: -Cardiac cath from 2010 showed single vessel disease involving LCx, no intervention done at that time. Possible intervention to be considered if he had repeat symptoms -Patient then presented to our group 09/2014 with mildly elevated troponin of 0.18 in the setting of PNA, sepsis, and acute on chronic respiratory failure. He declined ischemic evaluation and intervention. Only evaluation he would agree to was echo -He returned to [facility] on 5/31 with NSTEMI and a troponin of 4.20-->6.37-->7.26-->6.79 -He may very well have had a primary cardiac event at this time, that being a NSTEMI, however he was found to be hypotensive, febrile, and meet 4/4 SIRS criteria upon  presentation coupled with CXR that had concerns for repeat health care associated PNA. These precluded cardiac cath at the time of consult. On hospital day 2 he was found to be bacteremic with Gram variable diplococci in aerobic blood culture bottle (1/4) until ID of organism was completed which showed likely contaminant of strep viridans which further delayed cath until 6/3 -He is for cardiac cath today -Continue heparin gtt for cath  -Continue low dose Lopressor 12.5 mg bid with hold parameters  -Continue Lipitor 40 mg daily   -Continue aspirin 81 mg  -Echo was difficult study, but showed normal LV function, unable to exclude HK, GR1DD, PASP 47 mm Hg -Risks and benefits of cardiac catheterization have been discussed with the patient including risks of bleeding, bruising, infection, kidney damage, stroke, heart attack, and death. The patient understands these risks and is willing to proceed with the procedure. All questions have been answered and concerns listened to.   2. Acute on chronic respiratory failure with hypoxia, bacteremia, and sepsis: -Patient meets 4/4 SIRS criteria  -Likely 2/2 PNA and   COPD exacerbation -Cover for health care associated PNA with hospitalization within the past 90 days -Per IM -Consider ID consult for ABX coverage given recent hospitalization with PNA -Requiring 3L via nasal cannula currently   3. Hypotension: -Improved  4. Acute on chronic anemia: -HGB down to 9.6, baseline appears to be 13 -Presented with hgb of 10.7 -Possible dilution   5. Reported NSVT: -Maintaining sinus -BP precludes usage of beta blocker at this time  Signed, Dahiana Kulak, PA-C Pager: [phone] 11/28/2014, 7:59 AM   Attending Note Patient seen and examined, agree with detailed note above,  Patient presentation and plan discussed on rounds.   Continue SOB, wheezing and cough Mild improvement per the patient No chest pain No unstable angina For cardiac cath today  at noon with Dr. Arida given NSTEMI  Signed: Tim Gollan  M.D., Ph.D.   

## 2014-11-28 NOTE — Progress Notes (Signed)
[facility] - [place] at [facility]   PATIENT NAME: Keith Watts    MR#:  [id number]  DATE OF BIRTH:  1940/06/29  SUBJECTIVE:   Came in with increasing sob and cough. Was found to have NSTEMI and pneumonia Feels some better today. No cp  REVIEW OF SYSTEMS:    Review of Systems  Constitutional: Negative for fever, chills and weight loss.  HENT: Negative for ear discharge, ear pain and nosebleeds.   Eyes: Negative for blurred vision, pain and discharge.  Respiratory: Positive for cough and shortness of breath. Negative for sputum production, wheezing and stridor.   Cardiovascular: Negative for chest pain, palpitations, orthopnea and PND.  Gastrointestinal: Negative for nausea, vomiting, abdominal pain and diarrhea.  Genitourinary: Negative for urgency and frequency.  Musculoskeletal: Negative for back pain and joint pain.  Neurological: Positive for weakness. Negative for sensory change, speech change and focal weakness.  Psychiatric/Behavioral: Negative for depression. The patient is not nervous/anxious.   All other systems reviewed and are negative.   Tolerating Diet:yes Tolerating PT: eval pending  DRUG ALLERGIES:  No Known Allergies  VITALS:  Blood pressure 131/71, pulse 96, temperature 97.6 F (36.4 C), temperature source Oral, resp. rate 20, height  (1.626 m), weight 66.679 kg (147 lb), SpO2 91 %.  PHYSICAL EXAMINATION:   Physical Exam  Constitutional: He is oriented to person, place, and time and well-developed, well-nourished, and in no distress.  HENT:  Head: Atraumatic.  Nose: Nose normal.  Mouth/Throat: Oropharynx is clear and moist.  Eyes: EOM are normal. Pupils are equal, round, and reactive to light.  Neck: Normal range of motion. Neck supple. No thyromegaly present.  Cardiovascular: Normal rate, regular rhythm and normal heart sounds.   Pulmonary/Chest: Effort normal. No respiratory distress. He has wheezes. He has rales.   Coarse BS  Abdominal: Soft. Bowel sounds are normal. There is no tenderness. There is no rebound.  Musculoskeletal: Normal range of motion. He exhibits edema. He exhibits no tenderness.  Neurological: He is alert and oriented to person, place, and time. He displays normal reflexes. No cranial nerve deficit. He exhibits normal muscle tone. Coordination normal.  Skin: Skin is warm. No rash noted. No erythema.  Psychiatric: Memory and affect normal.   LABORATORY PANEL:   CBC  Recent Labs Lab 11/28/14 0424  WBC 14.0*  HGB 9.7*  HCT 31.1*  PLT 164    Chemistries   Recent Labs Lab 11/26/14 0240 11/27/14 1055 11/28/14 0424  NA 143  --  145  K 3.9  --  4.7  CL 106  --  107  CO2 30  --  34*  GLUCOSE 162*  --  109*  BUN 17  --  25*  CREATININE 1.08  --  1.01  CALCIUM 8.0*  --  8.7*  MG  --  2.1  --   AST 46*  --   --   ALT 18  --   --   ALKPHOS 92  --   --   BILITOT 0.3  --   --    Cardiac Enzymes  Recent Labs Lab 11/26/14 1728  TROPONINI 6.79*   ------------------------------------------------------------------------------------------------------------------  RADIOLOGY:  No results found. ASSESSMENT AND PLAN:  74 y.o. male with a known history of COPD on home oxygen 3 L/m, recent [facility] admission in April 2016 with pneumonia presents to the emergency room with the complaints of acute onset of shortness of breath leading to severe respiratory distress.  1. Acute on chronic  respiratory failure with hypoxia secondary to combination of left basilar pneumonia and COPD exacerbation. -received IV solumedrol, nebs, oxygen -IV Vanc and zosyn (shortage of cefepime)--->change to po levaquin -1/4BC  Gram variable diplococci likely contamination - sputum cx not send likley not producing phelegm  2. Sepsis secondary to left basilar pneumonia, [facility] with [facility]. HCAP. -bp stable. Not on any pressors  3. Acute NSTEMI with  history of CAD 1  vessel  heart disease by cath in 2010 -Pt on IV heparin gtt -spoke with Dr Kirke CorinArida and plans for Heart cath today -cont asa, nitro prn,metoprolol -cont statins  4. Arrhythmia  -pt had 11 second run of V. tach while in the ED resolved spontaneously. Patient maintaining sinus rhythm.  5. COPD with acute exacerbation. -rx as above  6.BPH  PT today Pt has declined to go to rehab. He is agreeable to HHPT (per CM)  Case discussed with Care Management/Social Worker. Management plans discussed with the patient and  in agreement.  CODE STATUS: FULL  DVT Prophylaxis: on heparin gtt  TOTAL TIME TAKING CARE OF THIS PATIENT: 35  minutes.   POSSIBLE D/C IN 1-2 DAYS, DEPENDING ON CLINICAL CONDITION.   Bexlee Bergdoll M.D on 11/28/2014 at 11:11 AM  Between 7am to 6pm - Pager - [phone]  After 6pm go to [email or web address] - password [email or web address] [facility]  [facility]  Office  [phone]  CC: Primary care physician; Mila Merryonald Fisher, MD

## 2014-11-28 NOTE — Interval H&P Note (Signed)
Cath Lab Visit (complete for each Cath Lab visit)  Clinical Evaluation Leading to the Procedure:   ACS: Yes.    Non-ACS:    Anginal Classification: CCS IV  Anti-ischemic medical therapy: Minimal Therapy (1 class of medications)  Non-Invasive Test Results: No non-invasive testing performed  Prior CABG: No previous CABG      History and Physical Interval Note:  11/28/2014 12:38 PM  Keith Watts  has presented today for surgery, with the diagnosis of NSTEMI  The various methods of treatment have been discussed with the patient and family. After consideration of risks, benefits and other options for treatment, the patient has consented to  Procedure(s) with comments: Left Heart Cath and Coronary Angiography (N/A) - please schedule for lunch time with Dr. Kirke CorinArida, MD as a surgical intervention .  The patient's history has been reviewed, patient examined, no change in status, stable for surgery.  I have reviewed the patient's chart and labs.  Questions were answered to the patient's satisfaction.     Keith BearsMuhammad Montie Watts

## 2014-11-28 NOTE — H&P (View-Only) (Signed)
Patient: Keith Watts / Admit Date: 11/26/2014 / Date of Encounter: 11/28/2014, 7:59 AM   Subjective: He is for cardiac cath today. Continued SOB with cough. Positive blood culture with strep viridans (1/4), likely contaminant. BP stable. Afebrile. Still with leukocytosis. No chest pain.     Review of Systems: Review of Systems  Constitutional: Positive for weight loss and malaise/fatigue. Negative for fever, chills and diaphoresis.  HENT:       HOH  Eyes: Negative for pain, discharge and redness.  Respiratory: Positive for cough, sputum production, shortness of breath and wheezing. Negative for hemoptysis.   Cardiovascular: Negative for chest pain, palpitations, orthopnea, claudication, leg swelling and PND.  Gastrointestinal: Negative for heartburn, nausea, vomiting, abdominal pain, diarrhea, constipation, blood in stool and melena.  Genitourinary: Negative for hematuria.  Musculoskeletal: Positive for myalgias. Negative for falls.  Skin: Negative for rash.  Neurological: Positive for weakness. Negative for sensory change, speech change, focal weakness and headaches.  Psychiatric/Behavioral: The patient is not nervous/anxious.      Objective: Telemetry: NSR, low 100's Physical Exam: Blood pressure 131/71, pulse 96, temperature 97.6 F (36.4 C), temperature source Oral, resp. rate 20, height  (1.626 m), weight 147 lb (66.679 kg), SpO2 91 %. Body mass index is 25.22 kg/(m^2). General: Well developed, well nourished, in no acute distress. Head: Normocephalic, atraumatic, sclera non-icteric, no xanthomas, nares are without discharge. Neck: Negative for carotid bruits. JVP not elevated. Lungs: Continued tight breath sounds with bilateral wheezing and rhonchi. No rales. Breathing is unlabored. Heart: Distant heart sounds. RRR S1 S2 without murmurs, rubs, or gallops.  Abdomen: Soft, non-tender, non-distended with normoactive bowel sounds. No rebound/guarding. Extremities: No  clubbing or cyanosis. No edema. Distal pedal pulses are 2+ and equal bilaterally. Neuro: Alert and oriented X 3. Moves all extremities spontaneously. Psych:  Responds to questions appropriately with a normal affect.   Intake/Output Summary (Last 24 hours) at 11/28/14 0759 Last data filed at 11/28/14 0438  Gross per 24 hour  Intake 875.13 ml  Output   2150 ml  Net -1274.87 ml    Inpatient Medications:  . aspirin EC  81 mg Oral Daily  . atorvastatin  40 mg Oral q1800  . methylPREDNISolone (SOLU-MEDROL) injection  60 mg Intravenous Daily  . metoprolol tartrate  12.5 mg Oral BID  . mometasone-formoterol  2 puff Inhalation BID  . pantoprazole  40 mg Oral Daily  . piperacillin-tazobactam (ZOSYN)  IV  3.375 g Intravenous 3 times per day  . sodium chloride  3 mL Intravenous Q12H  . vancomycin  1,000 mg Intravenous Q24H   Infusions:  . sodium chloride 1 mL/kg/hr (11/28/14 0730)  . heparin 900 Units/hr (11/28/14 0609)    Labs:  Recent Labs  11/26/14 0240 11/27/14 1055 11/28/14 0424  NA 143  --  145  K 3.9  --  4.7  CL 106  --  107  CO2 30  --  34*  GLUCOSE 162*  --  109*  BUN 17  --  25*  CREATININE 1.08  --  1.01  CALCIUM 8.0*  --  8.7*  MG  --  2.1  --     Recent Labs  11/26/14 0240  AST 46*  ALT 18  ALKPHOS 92  BILITOT 0.3  PROT 6.3*  ALBUMIN 2.7*    Recent Labs  11/26/14 0240 11/27/14 0324 11/28/14 0424  WBC 16.9* 13.2* 14.0*  NEUTROABS 13.5*  --   --   HGB 10.7* 9.6* 9.7*  HCT 33.6* 30.8* 31.1*  MCV 92.3 90.9 91.6  PLT 175 132* 164    Recent Labs  11/26/14 0240 11/26/14 0609 11/26/14 1306 11/26/14 1728  TROPONINI 4.20* 6.37* 7.26* 6.79*   Invalid input(s): POCBNP No results for input(s): HGBA1C in the last 72 hours.   Weights: Filed Weights   11/26/14 1834 11/27/14 0501 11/28/14 0430  Weight: 148 lb 14.4 oz (67.541 kg) 148 lb 6.4 oz (67.314 kg) 147 lb (66.679 kg)     Radiology/Studies:  Dg Chest Port 1 View  11/26/2014   CLINICAL  DATA:  Shortness of breath. Progressive over the last 6 hours. Hypoxia and dyspnea.  EXAM: PORTABLE CHEST - 1 VIEW  COMPARISON:  Radiograph 10/20/2014, CT 11/07/2013  FINDINGS: Cardiomediastinal contours are normal. There is increasing left basilar opacity from prior exam. Unchanged linear right basilar opacity. No consolidation in the upper lung zones. Underlying emphysema. No pleural effusion or pneumothorax.  IMPRESSION: Increasing left basilar opacity, may reflect atelectasis or pneumonia. Unchanged opacity at the right lung base. Followup PA and lateral chest X-ray is recommended in 3-4 weeks following trial of antibiotic therapy to ensure resolution and exclude underlying malignancy.   Electronically Signed   By: Rubye OaksMelanie  Ehinger M.D.   On: 11/26/2014 02:05     Assessment and Plan  74 y.o. male with h/o 1 vessel CAD managed medically by cardiac cath 2010, COPD, chronic respiratory failure on 3L nasal cannula at baseline, and BPH who [facility] end [facility] May with bilateral PNA, acute on chronic respiratory failure, and sepsis. [facility] on [facility] with increased SOB, fever, sinus tachycardia, hypotension, hypoxia and was found to be septic with PNA and have an elevated troponin of 6.37.   1. NSTEMI/CAD: -Cardiac cath from 2010 showed single vessel disease involving LCx, no intervention done at that time. Possible intervention to be considered if he had repeat symptoms -Patient then presented to our group 09/2014 with mildly elevated troponin of 0.18 in the setting of PNA, sepsis, and acute on chronic respiratory failure. He declined ischemic evaluation and intervention. Only evaluation he would agree to was echo -[facility] with NSTEMI and a troponin of 4.20-->6.37-->7.26-->6.79 -He may very well have had a primary cardiac event at this time, that being a NSTEMI, however he was found to be hypotensive, febrile, and meet 4/4 SIRS criteria upon  presentation coupled with CXR that had concerns for repeat health care associated PNA. These precluded cardiac cath at the time of consult. On hospital day 2 he was found to be bacteremic with Gram variable diplococci in aerobic blood culture bottle (1/4) until ID of organism was completed which showed likely contaminant of strep viridans which further delayed cath until 6/3 -He is for cardiac cath today -Continue heparin gtt for cath  -Continue low dose Lopressor 12.5 mg bid with hold parameters  -Continue Lipitor 40 mg daily   -Continue aspirin 81 mg  -Echo was difficult study, but showed normal LV function, unable to exclude HK, GR1DD, PASP 47 mm Hg -Risks and benefits of cardiac catheterization have been discussed with the patient including risks of bleeding, bruising, infection, kidney damage, stroke, heart attack, and death. The patient understands these risks and is willing to proceed with the procedure. All questions have been answered and concerns listened to.   2. Acute on chronic respiratory failure with hypoxia, bacteremia, and sepsis: -Patient meets 4/4 SIRS criteria  -Likely 2/2 PNA and  COPD exacerbation -Cover for health care associated PNA with hospitalization within the past 90 days -Per IM -Consider ID consult for ABX coverage given recent hospitalization with PNA -Requiring 3L via nasal cannula currently   3. Hypotension: -Improved  4. Acute on chronic anemia: -HGB down to 9.6, baseline appears to be 13 -Presented with hgb of 10.7 -Possible dilution   5. Reported NSVT: -Maintaining sinus -BP precludes usage of beta blocker at this time  Valaria Good Pager: [phone] 11/28/2014, 7:59 AM   Attending Note Patient seen and examined, agree with detailed note above,  Patient presentation and plan discussed on rounds.   Continue SOB, wheezing and cough Mild improvement per the patient No chest pain No unstable angina For cardiac cath today  at noon with Dr. Kirke Corin given NSTEMI  Signed: Dossie Arbour  M.D., Ph.D.

## 2014-11-28 NOTE — Progress Notes (Signed)
ANTIBIOTIC CONSULT NOTE - INITIAL  Pharmacy Consult for Vancomycin/Zosyn Indication: pneumonia  No Known Allergies  Patient Measurements: Height:  (162.6 cm) Weight: 147 lb (66.679 kg) IBW/kg (Calculated) : 59.2 Adjusted Body Weight: 62.4 kg  Vital Signs: Temp: 97.6 F (36.4 C) (06/03 1129) Temp Source: Oral (06/03 1129) BP: 142/60 mmHg (06/03 1129) Pulse Rate: 94 (06/03 1129) Intake/Output from previous day: 06/02 0701 - 06/03 0700 In: 875.1 [P.O.:480; I.V.:95.1; IV Piggyback:300] Out: 2150 [Urine:2150] Intake/Output from this shift:    Labs:  Recent Labs  11/26/14 0240 11/27/14 0324 11/28/14 0424  WBC 16.9* 13.2* 14.0*  HGB 10.7* 9.6* 9.7*  PLT 175 132* 164  CREATININE 1.08  --  1.01   Estimated Creatinine Clearance: 54.5 mL/min (by C-G formula based on Cr of 1.01).  Recent Labs  11/28/14 0937  [facility] 57*     Microbiology: Recent Results (from the past 720 hour(s))  Blood Culture (routine x 2)     Status: None (Preliminary result)   Collection Time: 11/26/14 12:54 AM  Result Value Ref Range Status   Specimen Description BLOOD  Final   Special Requests NONE  Final   Culture   Final    VIRIDANS STREPTOCOCCUS CRITICAL RESULT CALLED TO, READ BACK BY AND VERIFIED WITH: Sandrea Matte @ 2310 6.1.16 MPG AEROBIC BOTTLE ONLY POSSIBLE CONTAMINATION WITH SKIN FLORA    Report Status PENDING  Incomplete  Urine culture     Status: None   Collection Time: 11/26/14  2:35 AM  Result Value Ref Range Status   Specimen Description URINE, CLEAN CATCH  Final   Special Requests NONE  Final   Culture NO GROWTH  Final   Report Status 11/27/2014 FINAL  Final  Blood Culture (routine x 2)     Status: None (Preliminary result)   Collection Time: 11/26/14  3:22 AM  Result Value Ref Range Status   Specimen Description BLOOD  Final   Special Requests NONE  Final   Culture NO GROWTH 1 DAY  Final   Report Status PENDING  Incomplete  Culture, blood (routine x 2)      Status: None (Preliminary result)   Collection Time: 11/27/14 12:00 PM  Result Value Ref Range Status   Specimen Description BLOOD  Final   Special Requests NONE  Final   Culture NO GROWTH < 12 HOURS  Final   Report Status PENDING  Incomplete  Culture, blood (routine x 2)     Status: None (Preliminary result)   Collection Time: 11/27/14 12:00 PM  Result Value Ref Range Status   Specimen Description BLOOD  Final   Special Requests NONE  Final   Culture NO GROWTH < 12 HOURS  Final   Report Status PENDING  Incomplete    Medical History: Past Medical History  Diagnosis Date  . COPD (chronic obstructive pulmonary disease)   . Pneumonia     a. admission to [facility] 09/2014 & 11/2014  . CAD (coronary artery disease)     a. 1 vessel CAD by cardiac cath in 2010 managed medically   . Chronic respiratory failure     a. on 3L O2 via nasal cannula  . BPH (benign prostatic hyperplasia)   . Diastolic dysfunction     a. echo 09/2014: EF 50-55%, impaired relaxation of LV diastolic filling, nl RV size & sys fxn, mildly dilated LA, mild MR, mild Ao stenosis, nl PASP    Medications:  Scheduled:  . aspirin EC  81 mg Oral Daily  .  atorvastatin  40 mg Oral q1800  . finasteride  5 mg Oral QHS  . levofloxacin  500 mg Oral Daily  . methylPREDNISolone (SOLU-MEDROL) injection  60 mg Intravenous Daily  . metoprolol tartrate  12.5 mg Oral BID  . mometasone-formoterol  2 puff Inhalation BID  . pantoprazole  40 mg Oral Daily  . sodium chloride  3 mL Intravenous Q12H   Infusions:  . sodium chloride 1 mL/kg/hr (11/28/14 0730)  . heparin 900 Units/hr (11/28/14 [id number])   Assessment: Patient is a 74 yo male admitted for acute on chronic respiratory failure.  Patient is receiving empiric antibiotic therapy with Vancomycin and Zosyn IV.  Est CrCl~51 mL/min, ke: 0.048, t1/2: 14.4 h, Vd: 43.7 L  Goal of Therapy:  Eradication of infection  Goal trough of 15-20. Renal adjustment to Zosyn when warranted  Plan:   BCx growing strep viridans, however likely contaminate.  Vancomycin "trough" was drawn after dose given. Therefore level of 56 is a peak. Vancomycin and zosyn were d/c'd regardless and Levaquin 500mg  po q24hrs ordered.   Follow up blood cultures.  Pharmacy will continue to follow.  [address]Matt Cazenoviaarwile, VermontPharm D., BCPS 11/28/2014

## 2014-11-28 NOTE — Care Management (Signed)
Physical therapy was not able to eval and treat.  Patient off the unit having cardiac cath.  It is reported during progression that patient was heard to say if the cath showed he would need CABG- he would refuse it.  Patient again adamantly declines any thought regarding skilled nursing.  Will consider home health.  This needs to be addressed again prior to discharge.

## 2014-11-29 LAB — CBC
HEMATOCRIT: 32.3 % — AB (ref 40.0–52.0)
Hemoglobin: 10.2 g/dL — ABNORMAL LOW (ref 13.0–18.0)
MCH: 28.6 pg (ref 26.0–34.0)
MCHC: 31.4 g/dL — ABNORMAL LOW (ref 32.0–36.0)
MCV: 90.9 fL (ref 80.0–100.0)
Platelets: 192 10*3/uL (ref 150–440)
RBC: 3.56 MIL/uL — AB (ref 4.40–5.90)
RDW: 15.9 % — AB (ref 11.5–14.5)
WBC: 11.7 10*3/uL — ABNORMAL HIGH (ref 3.8–10.6)

## 2014-11-29 LAB — BASIC METABOLIC PANEL
ANION GAP: 6 (ref 5–15)
BUN: 22 mg/dL — ABNORMAL HIGH (ref 6–20)
CO2: 36 mmol/L — AB (ref 22–32)
Calcium: 8.6 mg/dL — ABNORMAL LOW (ref 8.9–10.3)
Chloride: 102 mmol/L (ref 101–111)
Creatinine, Ser: 1.09 mg/dL (ref 0.61–1.24)
GFR calc Af Amer: >60 mL/min (ref >60)
GFR calc non Af Amer: >60 mL/min (ref >60)
Glucose, Bld: 89 mg/dL (ref 65–99)
Potassium: 3.9 mmol/L (ref 3.5–5.1)
SODIUM: 144 mmol/L (ref 135–145)

## 2014-11-29 MED ORDER — FUROSEMIDE 20 MG PO TABS: 20.0000 mg | ORAL_TABLET | ORAL | Status: DC

## 2014-11-29 MED ORDER — METOPROLOL TARTRATE 25 MG PO TABS: 25.0000 mg | ORAL_TABLET | Freq: Two times a day (BID) | ORAL | Status: DC

## 2014-11-29 MED ORDER — ATORVASTATIN CALCIUM 40 MG PO TABS: 40.0000 mg | ORAL_TABLET | Freq: Every day | ORAL | Status: DC

## 2014-11-29 MED ORDER — PREDNISONE 10 MG PO TABS: 50.0000 mg | ORAL_TABLET | Freq: Every day | ORAL | Status: DC

## 2014-11-29 MED ORDER — LEVOFLOXACIN 500 MG PO TABS: 500.0000 mg | ORAL_TABLET | Freq: Every day | ORAL | Status: DC

## 2014-11-29 MED ORDER — FUROSEMIDE 40 MG PO TABS: 20.0000 mg | ORAL_TABLET | ORAL | Status: DC

## 2014-11-29 MED ORDER — FUROSEMIDE 40 MG PO TABS: 20.0000 mg | ORAL_TABLET | Freq: Every day | ORAL | Status: DC

## 2014-11-29 MED ORDER — IPRATROPIUM-ALBUTEROL 0.5-2.5 (3) MG/3ML IN SOLN: 3.0000 mL | Freq: Four times a day (QID) | RESPIRATORY_TRACT | Status: DC | PRN

## 2014-11-29 MED ORDER — MOMETASONE FURO-FORMOTEROL FUM 100-5 MCG/ACT IN AERO: 2.0000 | INHALATION_SPRAY | Freq: Two times a day (BID) | RESPIRATORY_TRACT | Status: DC

## 2014-11-29 MED ORDER — CLOPIDOGREL BISULFATE 75 MG PO TABS: 75.0000 mg | ORAL_TABLET | Freq: Every day | ORAL | Status: DC

## 2014-11-29 NOTE — Discharge Summary (Signed)
[facility] [place] at [facility]   PATIENT NAME: Keith Watts    MR#:  [id number]  DATE OF BIRTH:  [birth year]  DATE OF ADMISSION:  11/26/2014 ADMITTING PHYSICIAN: Enedina Finner, MD  DATE OF DISCHARGE: 11/29/14  PRIMARY CARE PHYSICIAN: [name], MD    ADMISSION DIAGNOSIS:  Healthcare-associated pneumonia [J18.9] Elevated troponin [R79.89] Acute respiratory failure with hypoxia [J96.01] Sepsis due to pneumonia [J18.9, A41.9]  DISCHARGE DIAGNOSIS:  Sepsis due to LLL pneumonia Acute NSTEMI s/p stent x1 Chronic respiratory failure on home oxygen Chronic diastolic CHF  SECONDARY DIAGNOSIS:   Past Medical History  Diagnosis Date  . COPD (chronic obstructive pulmonary disease)   . Pneumonia     a. admission to [facility] 09/2014 & 11/2014  . CAD (coronary artery disease)     a. 1 vessel CAD by cardiac cath in 2010 managed medically; b. cath 11/2014 mLAD 30%, mLCx 99% s/p PCI/DES 0%, mRCA 40%, mod calcified coronaries, nl LVSF, mod elevated LVEDP, mild AS  . Chronic respiratory failure     a. on 3L O2 via nasal cannula  . BPH (benign prostatic hyperplasia)   . Diastolic dysfunction     a. echo 09/2014: EF 50-55%, impaired relaxation of LV diastolic filling, nl RV size & sys fxn, mildly dilated LA, mild MR, mild Ao stenosis, nl PASP    HOSPITAL COURSE:   74 y.o. male with a known history of COPD on home oxygen 3 L/m, recent [facility] admission in April 2016 with pneumonia presents to the emergency room with the complaints of acute onset of shortness of breath leading to severe respiratory distress.  1. Acute on chronic respiratory failure with hypoxia secondary to combination of left basilar pneumonia and COPD exacerbation. -received IV solumedrol, nebs, oxygen-->change now on po steroid taper and cont inhalers and home oxygen -IV Vanc and zosyn (shortage of cefepime)--->changed to po levaquin -1/4BC Gram variable diplococci likely contamination - sputum cx not  send likley not producing phelegm  2. Sepsis secondary to left basilar pneumonia, recent [facility] admission in April 2016 with pneumonia. HCAP. -bp stable. Not on any pressors -sepsis resolved  3. Acute NSTEMI with history of CAD 1 vessel heart disease by cath in 2010 -Pt was on IV heparin gtt -s/p cardiac cath with drug eluting stent x1 in Mid circumflex by Dr Kirke Corin -cont asa, nitro prn,metoprolol and plavix -cont statins  4. Arrhythmia  -pt had 11 second run of V. tach while in the ED resolved spontaneously. Patient maintaining sinus rhythm.  5. COPD with acute exacerbation. -rx as above -has chronic oxygen at home and nebs   6.BPH -cont proscar  PT today Pt has declined to go to rehab. He is agreeable to HHPT (per CM) D/c home with HHPT/RN Spoke with Dr Jens Som Wife doris informed   DISCHARGE CONDITIONS:   fair  CONSULTS OBTAINED:  Treatment Team:  Marykay Lex, MD  DRUG ALLERGIES:  No Known Allergies  DISCHARGE MEDICATIONS:   Current Discharge Medication List    START taking these medications   Details  atorvastatin (LIPITOR) 40 MG tablet Take 1 tablet (40 mg total) by mouth daily at 6 PM. Qty: 30 tablet, Refills: 0    clopidogrel (PLAVIX) 75 MG tablet Take 1 tablet (75 mg total) by mouth daily with breakfast. Qty: 30 tablet, Refills: 0    furosemide (LASIX) 20 MG tablet Take 1 tablet (20 mg total) by mouth every other day. Qty: 30 tablet, Refills: 0    ipratropium-albuterol (  DUONEB) 0.5-2.5 (3) MG/3ML SOLN Take 3 mLs by nebulization every 6 (six) hours as needed. Qty: 360 mL, Refills: 1    levofloxacin (LEVAQUIN) 500 MG tablet Take 1 tablet (500 mg total) by mouth daily. Qty: 6 tablet, Refills: 0    metoprolol tartrate (LOPRESSOR) 25 MG tablet Take 1 tablet (25 mg total) by mouth 2 (two) times daily. Qty: 60 tablet, Refills: 0    mometasone-formoterol (DULERA) 100-5 MCG/ACT AERO Inhale 2 puffs into the lungs 2 (two) times daily. Qty: 1 Inhaler,  Refills: 0    predniSONE (DELTASONE) 10 MG tablet Take 5 tablets (50 mg total) by mouth daily with breakfast. Label and dispense accordingly Take 5 tabs day 1 4 tabs day 2 3 tabs day 3 2 tabs day 2 1 tab day 3 than stop Qty: 15 tablet, Refills: 0      CONTINUE these medications which have NOT CHANGED   Details  albuterol (PROVENTIL) (2.5 MG/3ML) 0.083% nebulizer solution Take 2.5 mg by nebulization every 6 (six) hours as needed for wheezing or shortness of breath.    aspirin EC 81 MG tablet Take 81 mg by mouth daily.    finasteride (PROSCAR) 5 MG tablet Take 5 mg by mouth at bedtime.      STOP taking these medications     Fluticasone-Salmeterol (ADVAIR) 250-50 MCG/DOSE AEPB          DISCHARGE INSTRUCTIONS:   As above  If you experience worsening of your admission symptoms, develop shortness of breath, life threatening emergency, suicidal or homicidal thoughts you must seek medical attention immediately by calling 911 or calling your MD immediately  if symptoms less severe.  You Must read complete instructions/literature along with all the possible adverse reactions/side effects for all the Medicines you take and that have been prescribed to you. Take any new Medicines after you have completely understood and accept all the possible adverse reactions/side effects.   Please note  You were cared for by a hospitalist during your hospital stay. If you have any questions about your discharge medications or the care you received while you were in the hospital after you are discharged, you can call the unit and asked to speak with the hospitalist on call if the hospitalist that took care of you is not available. Once you are discharged, your primary care physician will handle any further medical issues. Please note that NO REFILLS for any discharge medications will be authorized once you are discharged, as it is imperative that you return to your primary care physician (or establish a  relationship with a primary care physician if you do not have one) for your aftercare needs so that they can reassess your need for medications and monitor your lab values. Today   SUBJECTIVE   Feels better. Wants to eat [employer]  VITAL SIGNS:  Blood pressure 133/61, pulse 98, temperature 97.6 F (36.4 C), temperature source Oral, resp. rate 20, height  (1.626 m), weight 64.955 kg (143 lb 3.2 oz), SpO2 90 %.  I/O:   Intake/Output Summary (Last 24 hours) at 11/29/14 0849 Last data filed at 11/28/14 2326  Gross per 24 hour  Intake    240 ml  Output   4275 ml  Net  -4035 ml    PHYSICAL EXAMINATION:  Constitutional: He is oriented to person, place, and time and well-developed, well-nourished, and in no distress.  HENT:  Head: Atraumatic.  Nose: Nose normal.  Mouth/Throat: Oropharynx is clear and moist.  Eyes: EOM are  normal. Pupils are equal, round, and reactive to light.  Neck: Normal range of motion. Neck supple. No thyromegaly present.  Cardiovascular: Normal rate, regular rhythm and normal heart sounds.  Pulmonary/Chest: Effort normal. No respiratory distress.  Wheezes, rales.  Coarse BS  Abdominal: Soft. Bowel sounds are normal. There is no tenderness. There is no rebound.  Musculoskeletal: Normal range of motion.  exhibits edema.  exhibits no tenderness.  Neurological: He is alert and oriented to person, place, and time. He displays normal reflexes. No cranial nerve deficit. He exhibits normal muscle tone. Coordination normal.  Skin: Skin is warm. No rash noted. No erythema.  Psychiatric: Memory and affect normal. DATA REVIEW:   CBC   Recent Labs Lab 11/29/14 0556  WBC 11.7*  HGB 10.2*  HCT 32.3*  PLT 192    Chemistries   Recent Labs Lab 11/26/14 0240 11/27/14 1055  11/29/14 0556  NA 143  --   < > 144  K 3.9  --   < > 3.9  CL 106  --   < > 102  CO2 30  --   < > 36*  GLUCOSE 162*  --   < > 89  BUN 17  --   < > 22*  CREATININE 1.08  --   < >  1.09  CALCIUM 8.0*  --   < > 8.6*  MG  --  2.1  --   --   AST 46*  --   --   --   ALT 18  --   --   --   ALKPHOS 92  --   --   --   BILITOT 0.3  --   --   --   < > = values in this interval not displayed.  Microbiology Results   Recent Results (from the past 240 hour(s))  Blood Culture (routine x 2)     Status: None (Preliminary result)   Collection Time: 11/26/14 12:54 AM  Result Value Ref Range Status   Specimen Description BLOOD  Final   Special Requests NONE  Final   Culture   Final    VIRIDANS STREPTOCOCCUS CRITICAL RESULT CALLED TO, READ BACK BY AND VERIFIED WITH: YASMIN SARANO @ 2310 6.1.16 MPG AEROBIC BOTTLE ONLY POSSIBLE CONTAMINATION WITH SKIN FLORA    Report Status PENDING  Incomplete  Urine culture     Status: None   Collection Time: 11/26/14  2:35 AM  Result Value Ref Range Status   Specimen Description URINE, CLEAN CATCH  Final   Special Requests NONE  Final   Culture NO GROWTH  Final   Report Status 11/27/2014 FINAL  Final  Blood Culture (routine x 2)     Status: None (Preliminary result)   Collection Time: 11/26/14  3:22 AM  Result Value Ref Range Status   Specimen Description BLOOD  Final   Special Requests NONE  Final   Culture NO GROWTH 3 DAYS  Final   Report Status PENDING  Incomplete  Culture, blood (routine x 2)     Status: None (Preliminary result)   Collection Time: 11/27/14 12:00 PM  Result Value Ref Range Status   Specimen Description BLOOD  Final   Special Requests NONE  Final   Culture NO GROWTH 2 DAYS  Final   Report Status PENDING  Incomplete  Culture, blood (routine x 2)     Status: None (Preliminary result)   Collection Time: 11/27/14 12:00 PM  Result Value Ref Range Status   Specimen  Description BLOOD  Final   Special Requests NONE  Final   Culture NO GROWTH 2 DAYS  Final   Report Status PENDING  Incomplete    RADIOLOGY:  No results found.  Management plans discussed with the patient, family and they are in agreement.  CODE  STATUS:     Code Status Orders        Start     Ordered   11/28/14 1347  Full code   Continuous     11/28/14 1349    Advance Directive Documentation        Most Recent Value   Type of Advance Directive  Living will   Pre-existing out of facility DNR order (yellow form or pink MOST form)     "MOST" Form in Place?        TOTAL TIME TAKING CARE OF THIS PATIENT: 40 minutes.    Marky Buresh M.D on 11/29/2014 at 8:49 AM  Between 7am to 6pm - Pager - [phone] After 6pm go to [email or web address] - password [email or web address] [facility]  [facility]  Office  [phone]  CC: Primary care physician; [name], MD

## 2014-11-29 NOTE — Care Management Note (Signed)
Case Management Note  Patient Details  Name: Keith NapoleonHenry Harmon Watts MRN: [id number] Date of Birth: 1940/12/04  Subjective/Objective:         Chronic 3L N/C oxygen at home supplied by [facility] and will go with [facility] to provide PT and RN. Referral faxed and called to Chastity at [facility] for PT and RN.           Action/Plan:   Expected Discharge Date:                  Expected Discharge Plan:     In-House Referral:     Discharge planning Services     Post Acute Care Choice:    Choice offered to:     DME Arranged:    DME Agency:     HH Arranged:    HH Agency:     Status of Service:     Medicare Important Message Given:    Date Medicare IM Given:    Medicare IM give by:    Date Additional Medicare IM Given:    Additional Medicare Important Message give by:     If discussed at Long Length of Stay Meetings, dates discussed:    Additional Comments:  Asani Deniston A, RN 11/29/2014, 10:37 AM

## 2014-11-29 NOTE — Progress Notes (Signed)
PT Cancellation Note  Patient Details Name: Keith NapoleonHenry Harmon Hanak MRN: [id number] DOB: July 29, 1940   Cancelled Treatment:    Reason Eval/Treat Not Completed: Other (comment). Chart reviewed, RN consulted. Holding pt evaluation at this time due to RN report that pt is preparing for DC. RN reports that pt already has DC orders signed and that cardiology has set up HHPT.   Festus Pursel C 11/29/2014, 11:29 AM  11:30 AM  Rosamaria LintsAllan C Salil Raineri, PT, DPT [place] License # [id number]

## 2014-11-29 NOTE — Progress Notes (Signed)
Discharge instructions explained to pt and pts wife/ verbalized an understanding/ iv and tele removed/ home health set up by care mag/ transported off unit via wheelchair.

## 2014-11-29 NOTE — Progress Notes (Signed)
Pt A&O x4, no  Signs or c/o of pain. Bed in low position, call bell within reach. Bed alarms on and functioning. Will continue to monitor and do hourly rounding throughout the shift. Pain  0/10. F/C continues draining clear yellow.

## 2014-12-01 ENCOUNTER — Encounter: Payer: Self-pay | Admitting: Cardiovascular Disease

## 2014-12-01 LAB — CULTURE, BLOOD (ROUTINE X 2): CULTURE: NO GROWTH

## 2014-12-02 LAB — CULTURE, BLOOD (ROUTINE X 2)
CULTURE: NO GROWTH
CULTURE: NO GROWTH

## 2014-12-05 ENCOUNTER — Telehealth: Payer: Self-pay | Admitting: Cardiovascular Disease

## 2014-12-05 NOTE — Telephone Encounter (Signed)
Patient aware that msg will be forwarded to triage nurse but to call pcp office as well.

## 2014-12-05 NOTE — Telephone Encounter (Signed)
Patient has productive cough with mucous that is red .  Wants to know if this is in association with cath through wrist.

## 2014-12-05 NOTE — Telephone Encounter (Signed)
Left message on Keith Watts's vm asking her to call pt's PCP.  Asked her to call back w/ any questions or concerns.

## 2014-12-11 ENCOUNTER — Encounter (INDEPENDENT_AMBULATORY_CARE_PROVIDER_SITE_OTHER): Payer: PPO | Admitting: Family Medicine

## 2014-12-11 DIAGNOSIS — I214 Non-ST elevation (NSTEMI) myocardial infarction: Secondary | ICD-10-CM | POA: Diagnosis not present

## 2014-12-11 DIAGNOSIS — J189 Pneumonia, unspecified organism: Secondary | ICD-10-CM

## 2014-12-11 DIAGNOSIS — J9610 Chronic respiratory failure, unspecified whether with hypoxia or hypercapnia: Secondary | ICD-10-CM | POA: Diagnosis not present

## 2014-12-11 DIAGNOSIS — J449 Chronic obstructive pulmonary disease, unspecified: Secondary | ICD-10-CM | POA: Diagnosis not present

## 2014-12-15 ENCOUNTER — Ambulatory Visit (INDEPENDENT_AMBULATORY_CARE_PROVIDER_SITE_OTHER): Payer: PPO | Admitting: Cardiovascular Disease

## 2014-12-15 ENCOUNTER — Encounter: Payer: Self-pay | Admitting: Cardiovascular Disease

## 2014-12-15 VITALS — BP 80/52 | HR 80 | Ht 62.0 in | Wt 131.0 lb

## 2014-12-15 DIAGNOSIS — I5032 Chronic diastolic (congestive) heart failure: Secondary | ICD-10-CM | POA: Diagnosis not present

## 2014-12-15 DIAGNOSIS — I959 Hypotension, unspecified: Secondary | ICD-10-CM

## 2014-12-15 DIAGNOSIS — I214 Non-ST elevation (NSTEMI) myocardial infarction: Secondary | ICD-10-CM

## 2014-12-15 DIAGNOSIS — I25119 Atherosclerotic heart disease of native coronary artery with unspecified angina pectoris: Secondary | ICD-10-CM

## 2014-12-15 DIAGNOSIS — E785 Hyperlipidemia, unspecified: Secondary | ICD-10-CM | POA: Insufficient documentation

## 2014-12-15 NOTE — Progress Notes (Signed)
Primary care physician: Dr. Sherrie Mustache  HPI  This is a pleasant 74 year old male who is here today for a follow-up visit regarding coronary artery disease and recent hospitalization. He has known history of COPD on home oxygen since 2008, previous tobacco use and hyperlipidemia. He was hospitalized early this month at [facility], [place] for pneumonia and respiratory failure. He was treated with serial exams and anti-biotics. He was noted to have elevated troponin suggestive of non-ST elevation myocardial infarction. I proceeded with cardiac catheterization via the right radial artery which showed mild LAD and RCA disease. There was 99% mid left circumflex stenosis which was treated successfully with drug-eluting stent placement. LV systolic function was normal. However, LVEDP was 24 mmHg. He was started on Lasix. He has been doing well since hospital discharge and denies any chest pain. Dyspnea improved. He initially had blood tinged sputum after hospital discharge but that has resolved without intervention. He is noted to be hypotensive today and complains of mild dizziness. He lost 7 kg since hospital discharge. He denies any bleeding.  No Known Allergies   Current Outpatient Prescriptions on File Prior to Visit  Medication Sig Dispense Refill  . albuterol (PROVENTIL) (2.5 MG/3ML) 0.083% nebulizer solution Take 2.5 mg by nebulization every 6 (six) hours as needed for wheezing or shortness of breath.    Marland Kitchen aspirin EC 81 MG tablet Take 81 mg by mouth daily.    Marland Kitchen atorvastatin (LIPITOR) 40 MG tablet Take 1 tablet (40 mg total) by mouth daily at 6 PM. 30 tablet 0  . clopidogrel (PLAVIX) 75 MG tablet Take 1 tablet (75 mg total) by mouth daily with breakfast. 30 tablet 0  . finasteride (PROSCAR) 5 MG tablet Take 5 mg by mouth at bedtime.    Marland Kitchen ipratropium-albuterol (DUONEB) 0.5-2.5 (3) MG/3ML SOLN Take 3 mLs by nebulization every 6 (six) hours as needed. 360 mL 1  . mometasone-formoterol (DULERA) 100-5 MCG/ACT AERO Inhale  2 puffs into the lungs 2 (two) times daily. 1 Inhaler 0   No current facility-administered medications on file prior to visit.     Past Medical History  Diagnosis Date  . COPD (chronic obstructive pulmonary disease)   . Pneumonia     a. admission to [facility] 09/2014 & 11/2014  . CAD (coronary artery disease)     a. 1 vessel CAD by cardiac cath in 2010 managed medically; b. cath 11/2014 mLAD 30%, mLCx 99% s/p PCI/DES 0%, mRCA 40%, mod calcified coronaries, nl LVSF, mod elevated LVEDP, mild AS  . Chronic respiratory failure     a. on 3L O2 via nasal cannula  . BPH (benign prostatic hyperplasia)   . Diastolic dysfunction     a. echo 09/2014: EF 50-55%, impaired relaxation of LV diastolic filling, nl RV size & sys fxn, mildly dilated LA, mild MR, mild Ao stenosis, nl PASP     Past Surgical History  Procedure Laterality Date  . Eye surgery    . Appendectomy    . Tonsillectomy    . Cardiac catheterization N/A 11/28/2014    Procedure: Left Heart Cath and Coronary Angiography;  Surgeon: Iran Ouch, MD;  Location: [facility] INVASIVE CV LAB;  Service: Cardiovascular;  Laterality: N/A;  please schedule for lunch time with Dr. Kirke Corin, MD  . Cardiac catheterization N/A 11/28/2014    Procedure: Coronary Stent Intervention;  Surgeon: Iran Ouch, MD;  Location: [facility] INVASIVE CV LAB;  Service: Cardiovascular;  Laterality: N/A;     Family History  Problem Relation Age of  Onset  . Cancer - Lung Mother   . Cancer - Colon Father      History   Social History  . Marital Status: Married    Spouse Name: N/A  . Number of Children: N/A  . Years of Education: N/A   Occupational History  . Not on file.   Social History Main Topics  . Smoking status: Former Games developer  . Smokeless tobacco: Not on file  . Alcohol Use: No  . Drug Use: No  . Sexual Activity: Not on file   Other Topics Concern  . Not on file   Social History Narrative       PHYSICAL EXAM    BP 80/52 mmHg  Pulse 80  Ht 5'  2" (1.575 m)  Wt 131 lb (59.421 kg)  BMI 23.95 kg/m2 Constitutional: He is oriented to person, place, and time. He appears well-developed and well-nourished. No distress.  HENT: No nasal discharge.  Head: Normocephalic and atraumatic.  Eyes: Pupils are equal and round.  No discharge. Neck: Normal range of motion. Neck supple. No JVD present. No thyromegaly present.  Cardiovascular: Normal rate, regular rhythm, normal heart sounds. Exam reveals no gallop and no friction rub. No murmur heard.  Pulmonary/Chest: Diminished breath sounds bilaterally with bronchial breath sounds at the base. He is mildly tachypneic.  Abdominal: Soft. Bowel sounds are normal. He exhibits no distension. There is no tenderness. There is no rebound and no guarding.  Musculoskeletal: Normal range of motion. He exhibits no edema and no tenderness.  Neurological: He is alert and oriented to person, place, and time. Coordination normal.  Skin: Skin is warm and dry. No rash noted. He is not diaphoretic. No erythema. No pallor.  Psychiatric: He has a normal mood and affect. His behavior is normal. Judgment and thought content normal.   Right radial pulse is normal with no hematoma.     EKG: Sinus  Rhythm  -Left axis -anterior fascicular block.   ABNORMAL     ASSESSMENT AND PLAN

## 2014-12-15 NOTE — Patient Instructions (Addendum)
Medication Instructions:  Your physician has recommended you make the following change in your medication:  1) STOP taking your metoprolol 2) STOP taking your lasix    Labwork: Your physician recommends that you have labs today: CBC and BMET   Testing/Procedures: none  Follow-Up: Your physician recommends that you schedule a follow-up appointment in: three weeks with Dr. Kirke Corin.    Any Other Special Instructions Will Be Listed Below (If Applicable).

## 2014-12-15 NOTE — Assessment & Plan Note (Signed)
He was started on atorvastatin 40 mg daily. He will require a fasting lipid and liver profile in the near future.

## 2014-12-15 NOTE — Assessment & Plan Note (Signed)
The patient seems to be doing reasonably well from a cardiac standpoint with no anginal symptoms. Continue dual antiplatelets therapy for at least one year given the recent placement of drug-eluting stent.

## 2014-12-15 NOTE — Assessment & Plan Note (Signed)
The patient appears to be volume depleted likely due to overdiuresis. I requested routine labs today including CBC and basic metabolic profile. I stopped for a semi-and metoprolol due to low blood pressure. If his dizziness gets worse, I instructed them to go to the emergency room. In the meanwhile, the wife will try to increase his oral fluid intake.

## 2014-12-16 ENCOUNTER — Other Ambulatory Visit: Payer: Self-pay

## 2014-12-16 LAB — BASIC METABOLIC PANEL
BUN / CREAT RATIO: 22 (ref 10–22)
BUN: 24 mg/dL (ref 8–27)
CO2: 33 mmol/L — AB (ref 18–29)
CREATININE: 1.08 mg/dL (ref 0.76–1.27)
Calcium: 8.9 mg/dL (ref 8.6–10.2)
Chloride: 98 mmol/L (ref 97–108)
GFR calc Af Amer: 78 mL/min/{1.73_m2} (ref >59)
GFR calc non Af Amer: 68 mL/min/{1.73_m2} (ref >59)
Glucose: 108 mg/dL — ABNORMAL HIGH (ref 65–99)
Potassium: 4.9 mmol/L (ref 3.5–5.2)
SODIUM: 144 mmol/L (ref 134–144)

## 2014-12-16 LAB — CBC
Hematocrit: 36.1 % — ABNORMAL LOW (ref 37.5–51.0)
Hemoglobin: 11.5 g/dL — ABNORMAL LOW (ref 12.6–17.7)
MCH: 29.1 pg (ref 26.6–33.0)
MCHC: 31.9 g/dL (ref 31.5–35.7)
MCV: 91 fL (ref 79–97)
Platelets: 166 10*3/uL (ref 150–379)
RBC: 3.95 x10E6/uL — AB (ref 4.14–5.80)
RDW: 16.1 % — ABNORMAL HIGH (ref 12.3–15.4)
WBC: 6.8 10*3/uL (ref 3.4–10.8)

## 2014-12-16 MED ORDER — FUROSEMIDE 20 MG PO TABS: 20.0000 mg | ORAL_TABLET | Freq: Every day | ORAL | Status: DC

## 2014-12-17 ENCOUNTER — Encounter: Payer: Self-pay | Admitting: *Deleted

## 2014-12-17 ENCOUNTER — Ambulatory Visit (INDEPENDENT_AMBULATORY_CARE_PROVIDER_SITE_OTHER): Payer: PPO | Admitting: Family Medicine

## 2014-12-17 ENCOUNTER — Encounter: Payer: Self-pay | Admitting: Family Medicine

## 2014-12-17 ENCOUNTER — Telehealth: Payer: Self-pay | Admitting: Family Medicine

## 2014-12-17 VITALS — BP 86/52 | HR 76 | Temp 97.5°F | Resp 16

## 2014-12-17 DIAGNOSIS — J45909 Unspecified asthma, uncomplicated: Secondary | ICD-10-CM | POA: Insufficient documentation

## 2014-12-17 DIAGNOSIS — J189 Pneumonia, unspecified organism: Secondary | ICD-10-CM | POA: Diagnosis not present

## 2014-12-17 DIAGNOSIS — J449 Chronic obstructive pulmonary disease, unspecified: Secondary | ICD-10-CM

## 2014-12-17 DIAGNOSIS — I700 Atherosclerosis of aorta: Secondary | ICD-10-CM | POA: Insufficient documentation

## 2014-12-17 DIAGNOSIS — I213 ST elevation (STEMI) myocardial infarction of unspecified site: Secondary | ICD-10-CM | POA: Diagnosis not present

## 2014-12-17 DIAGNOSIS — E559 Vitamin D deficiency, unspecified: Secondary | ICD-10-CM | POA: Insufficient documentation

## 2014-12-17 DIAGNOSIS — R296 Repeated falls: Secondary | ICD-10-CM | POA: Insufficient documentation

## 2014-12-17 NOTE — Telephone Encounter (Signed)
Deniece Portela would like verbal orders to make up the physical therapy appt that was missed this week. Thanks TNP

## 2014-12-17 NOTE — Progress Notes (Signed)
Subjective:    Patient ID: Keith Watts, male    DOB: 07-31-1940, 74 y.o.   MRN: [id number]  Pneumonia He complains of cough. There is no shortness of breath or wheezing. This is a recurrent problem. The current episode started 1 to 4 weeks ago. The problem has been gradually improving. Associated symptoms include a fever and rhinorrhea. Pertinent negatives include no appetite change, chest pain, ear pain, headaches, postnasal drip, sneezing, sore throat or trouble swallowing. His symptoms are alleviated by oral steroids, rest and steroid inhaler.    Follow up Hospitalization  Patient was admitted to [facility] on 11/26/2014 and discharged on 60/09/2014. He was treated for MI and Pneumonia,Treatment for this included Prednisone Taper, Levaquin. He had cardiac Stent Placement. He reports excellent compliance with treatment. He reports this condition is Improved. He has had no chest pain or cough since discharge. He had follow up with Dr. Fletcher Anon on 6-20 and Met b CBC done which were unremarkable.  He has follow up with Dr. Raul Del for COPD on July 7th. Home health has been coming out twice a week and PT starting next week.   ------------------------------------------------------------------------------------     Patient Active Problem List   Diagnosis Date Noted  . Asthma 12/17/2014  . Arteriosclerosis, aorta 12/17/2014  . Frequent falls 12/17/2014  . Vitamin D deficiency 12/17/2014  . Chronic diastolic heart failure 54/27/0623  . Hyperlipidemia 12/15/2014  . NSTEMI (non-ST elevated myocardial infarction)   . Diastolic dysfunction   . Coronary artery disease   . Sepsis due to pneumonia 11/26/2014  . ACS (acute coronary syndrome) 11/26/2014  . COPD (chronic obstructive pulmonary disease) 11/26/2014  . COPD Exacerbation 11/26/2014  . Sepsis 10/25/2014  . Benign prostatic hyperplasia with urinary obstruction 04/18/2012  . Aneurysm, abdominal aortic 10/01/2008  . Hypothyroidism  09/27/2008  . 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 Elevated prostate specific antigen (PSA) 07/16/2006  . History of tobacco use 07/12/2006  . Allergic rhinitis 06/27/2004   Family History  Problem Relation Age of Onset  . Cancer - Lung Mother   . Cancer - Colon Father   . Colon cancer Father   . Liver cancer Brother   . Pancreatic cancer Brother   . Prostate cancer Brother   . Liver cancer Brother    History   Social History  . Marital Status: Married    Spouse Name: N/A  . Number of Children: N/A  . Years of Education: N/A   Occupational History  . [employer]    Social History Main Topics  . Smoking status: Former Research scientist (life sciences)  . Smokeless tobacco: Never Used  . Alcohol Use: No  . Drug Use: No  . Sexual Activity: Not on file   Other Topics Concern  . Not on file   Social History Narrative   Past Surgical History  Procedure Laterality Date  . Eye surgery    . Appendectomy    . Tonsillectomy    . Cardiac catheterization N/A 11/28/2014    Procedure: Left Heart Cath and Coronary Angiography;  Surgeon: Wellington Hampshire, MD;  Location: [place] CV [facility];  Service: Cardiovascular;  Laterality: N/A;  please schedule for lunch time with Dr. Fletcher Anon, MD  . Cardiac catheterization N/A 11/28/2014    Procedure: Coronary Stent Intervention;  Surgeon: Wellington Hampshire, MD;  Location: [place] CV [facility];  Service: Cardiovascular;  Laterality: N/A;   No Known Allergies Current Outpatient Prescriptions on File Prior to Visit  Medication Sig Dispense Refill  .  aspirin EC 81 MG tablet Take 81 mg by mouth daily.    Marland Kitchen atorvastatin (LIPITOR) 40 MG tablet Take 1 tablet (40 mg total) by mouth daily at 6 PM. 30 tablet 0  . clopidogrel (PLAVIX) 75 MG tablet Take 1 tablet (75 mg total) by mouth daily with breakfast. 30 tablet 0  . finasteride (PROSCAR) 5 MG tablet Take 5 mg by mouth at bedtime.    . furosemide (LASIX) 20 MG tablet Take 1 tablet (20 mg total) by mouth daily. 30 tablet 5  .  ipratropium-albuterol (DUONEB) 0.5-2.5 (3) MG/3ML SOLN Take 3 mLs by nebulization every 6 (six) hours as needed. 360 mL 1  . mometasone-formoterol (DULERA) 100-5 MCG/ACT AERO Inhale 2 puffs into the lungs 2 (two) times daily. 1 Inhaler 0  . Vitamin D, Ergocalciferol, (DRISDOL) 50000 UNITS CAPS capsule Take 50,000 Units by mouth every 7 (seven) days.    Marland Kitchen albuterol (PROVENTIL) (2.5 MG/3ML) 0.083% nebulizer solution Take 2.5 mg by nebulization every 6 (six) hours as needed for wheezing or shortness of breath.     No current facility-administered medications on file prior to visit.       Review of Systems  Constitutional: Positive for fever and fatigue (Reports improving some.). Negative for chills, diaphoresis, activity change, appetite change and unexpected weight change.  HENT: Positive for rhinorrhea. Negative for congestion, dental problem, drooling, ear discharge, ear pain, facial swelling, hearing loss, mouth sores, nosebleeds, postnasal drip, sinus pressure, sneezing, sore throat, tinnitus, trouble swallowing and voice change.   Respiratory: Positive for cough. Negative for apnea, choking, chest tightness, shortness of breath, wheezing and stridor.   Cardiovascular: Negative for chest pain, palpitations and leg swelling.  Gastrointestinal: Negative.   Neurological: Positive for light-headedness. Negative for dizziness, tremors, seizures, syncope, speech difficulty, weakness, numbness and headaches.       Objective:   Physical Exam BP 86/52 mmHg  Pulse 76  Temp(Src) 97.5 F (36.4 C) (Oral)  Resp 16   General Appearance:    Alert, cooperative, no distress  Eyes:    PERRL, conjunctiva/corneas clear, EOM's intact       Lungs:     Clear to auscultation bilaterally, respirations unlabored. Breath sounds diminished throughout.   Heart:    Regular rate and rhythm  Neurologic:   Awake, alert, oriented x 3. No apparent focal neurological           defect.              Assessment  & Plan:  1. Pneumonia, unspecified laterality, unspecified part of lung Resolved. Continue current inhalers  2. ST elevation myocardial infarction (STEMI), unspecified artery Follow up cardiology as scheduled. Continue current medications.    3. Chronic obstructive pulmonary disease, unspecified COPD, unspecified chronic bronchitis type Doing well on current inhalers. Follow up pulmonary in 2 weeks as scheduled.

## 2014-12-17 NOTE — Telephone Encounter (Signed)
OK to make up missed physical therapy session.

## 2014-12-19 LAB — GLUCOSE, CAPILLARY: Glucose-Capillary: 164 mg/dL — ABNORMAL HIGH (ref 65–99)

## 2014-12-25 ENCOUNTER — Ambulatory Visit
Admission: RE | Admit: 2014-12-25 | Discharge: 2014-12-25 | Disposition: A | Discharge status: Home / Self Care | Payer: PPO | Source: Ambulatory Visit | Attending: Physician Assistant | Admitting: Physician Assistant

## 2014-12-25 ENCOUNTER — Encounter: Payer: Self-pay | Admitting: Physician Assistant

## 2014-12-25 ENCOUNTER — Ambulatory Visit (INDEPENDENT_AMBULATORY_CARE_PROVIDER_SITE_OTHER): Payer: PPO | Admitting: Physician Assistant

## 2014-12-25 VITALS — BP 70/40 | HR 80 | Temp 98.0°F | Resp 18

## 2014-12-25 DIAGNOSIS — I959 Hypotension, unspecified: Secondary | ICD-10-CM | POA: Diagnosis not present

## 2014-12-25 DIAGNOSIS — W1809XA Striking against other object with subsequent fall, initial encounter: Secondary | ICD-10-CM

## 2014-12-25 DIAGNOSIS — R0602 Shortness of breath: Secondary | ICD-10-CM | POA: Diagnosis present

## 2014-12-25 DIAGNOSIS — J449 Chronic obstructive pulmonary disease, unspecified: Secondary | ICD-10-CM | POA: Insufficient documentation

## 2014-12-25 NOTE — Patient Instructions (Signed)

## 2014-12-25 NOTE — Progress Notes (Signed)
Subjective:    Patient ID: Keith Watts, male    DOB: 25-Jun-1941, 74 y.o.   MRN: [id number]  Fall The accident occurred 12 to 24 hours ago. The fall occurred while walking (walking from bathroom and either got his shoe caught on uneven tile floor or tripped over his oxygen tubing). He fell from a height of 3 to 5 ft. He landed on hard floor (hit back on door knob). There was no blood loss. Point of impact: midback and buttocks. The pain is present in the back (bruising but no pain). The pain is at a severity of 0/10. The patient is experiencing no pain. Pertinent negatives include no abdominal pain, bowel incontinence, fever, headaches, hearing loss, hematuria, loss of consciousness, nausea, numbness, tingling, visual change or vomiting. He has tried ice for the symptoms. The treatment provided moderate relief.      Review of Systems  Constitutional: Negative for fever, chills, diaphoresis, activity change, appetite change, fatigue and unexpected weight change.  HENT: Negative.   Eyes: Negative.   Respiratory: Positive for cough, shortness of breath (Severe COPD on 3L O2) and wheezing. Negative for apnea, choking and chest tightness.   Cardiovascular: Negative for chest pain, palpitations and leg swelling.  Gastrointestinal: Negative for nausea, vomiting, abdominal pain, diarrhea, constipation, blood in stool and bowel incontinence.  Endocrine: Negative.   Genitourinary: Negative.  Negative for hematuria.  Musculoskeletal: Negative.   Skin: Positive for wound (Does have large area of ecchymosis on mid back from fall.  No tenderness to palpation).  Allergic/Immunologic: Negative.   Neurological: Positive for weakness. Negative for dizziness, tingling, seizures, loss of consciousness, syncope, speech difficulty, light-headedness, numbness and headaches.  Hematological: Negative for adenopathy. Bruises/bleeds easily (On plavix and ASA).  Psychiatric/Behavioral: Negative.          Objective:   Physical Exam  Constitutional: He is oriented to person, place, and time. He appears well-developed. No distress.  HENT:  Head: Normocephalic and atraumatic.  Eyes: Pupils are equal, round, and reactive to light.  Neck: Normal range of motion. Neck supple. No JVD present. No tracheal deviation present. No thyromegaly present.  Cardiovascular: Normal rate, regular rhythm, normal heart sounds and intact distal pulses.  Exam reveals no gallop and no friction rub.   No murmur heard. Pulmonary/Chest: Breath sounds normal. No stridor. He is in respiratory distress (Mild respiratory distress using accessory muscles to pass air; Per patient and wife this is baseline). He has no wheezes. He has no rales. He exhibits no tenderness.  Musculoskeletal: Normal range of motion. He exhibits no edema or tenderness.  Lymphadenopathy:    He has no cervical adenopathy.  Neurological: He is alert and oriented to person, place, and time.  Skin: Skin is warm and dry. Bruising (Bruises noted on bilateral elbows wife reports are from hospital stay in early June 2016) and ecchymosis (Large area of ecchymosis along mid back from fall) noted. He is not diaphoretic. There is cyanosis (Cyanotic nail beds and lips). There is pallor.     Psychiatric: He has a normal mood and affect. His behavior is normal. Judgment and thought content normal.  Vitals reviewed.         Assessment & Plan:  1. Fall against object, initial encounter Will check for pneumothorax, hemothorax and rib fractures.  F/U in 2 weeks if no improvement. - DG Chest 2 View; Future  2. Hypotension, unspecified hypotension type Unknown cause.  Not on any anti-hypertensives.  Patient states he has had low  blood pressure since his MI and cardiac catheterization in early June 2016.  Advised to monitor and if he gets feeling very weak, unarousable, light-headed, dizzy, nauseous, diaphoretic or altered mental status I advised his wife to take  him to the ER.  Will f/u in 2 weeks.

## 2014-12-30 ENCOUNTER — Other Ambulatory Visit: Payer: Self-pay

## 2014-12-30 ENCOUNTER — Telehealth: Payer: Self-pay | Admitting: Family Medicine

## 2014-12-30 DIAGNOSIS — Z9861 Coronary angioplasty status: Secondary | ICD-10-CM

## 2014-12-30 NOTE — Telephone Encounter (Signed)
Pt contacted office for refill request on the following medications:  clopidogrel (PLAVIX) 75 MG tablet and atorvastatin (LIPITOR) 40 MG tablet.  [facility]  ZO#[phone]/WJB#[phone]/[id number]    This is a Dr Sherrie MustacheFisher pt.

## 2014-12-31 ENCOUNTER — Other Ambulatory Visit: Payer: Self-pay | Admitting: Family Medicine

## 2014-12-31 MED ORDER — ATORVASTATIN CALCIUM 40 MG PO TABS: 40.0000 mg | ORAL_TABLET | Freq: Every day | ORAL | Status: DC

## 2014-12-31 MED ORDER — CLOPIDOGREL BISULFATE 75 MG PO TABS: 75.0000 mg | ORAL_TABLET | Freq: Every day | ORAL | Status: DC

## 2014-12-31 NOTE — Telephone Encounter (Signed)
See below-aa 

## 2015-01-05 ENCOUNTER — Ambulatory Visit
Admission: RE | Admit: 2015-01-05 | Discharge: 2015-01-05 | Disposition: A | Discharge status: Home / Self Care | Payer: PPO | Source: Ambulatory Visit | Attending: Family Medicine | Admitting: Family Medicine

## 2015-01-05 ENCOUNTER — Telehealth: Payer: Self-pay | Admitting: *Deleted

## 2015-01-05 ENCOUNTER — Telehealth: Payer: Self-pay

## 2015-01-05 ENCOUNTER — Encounter: Payer: Self-pay | Admitting: Family Medicine

## 2015-01-05 ENCOUNTER — Ambulatory Visit (INDEPENDENT_AMBULATORY_CARE_PROVIDER_SITE_OTHER): Payer: PPO | Admitting: Family Medicine

## 2015-01-05 VITALS — BP 90/48 | HR 96 | Temp 98.1°F | Resp 32

## 2015-01-05 DIAGNOSIS — I2510 Atherosclerotic heart disease of native coronary artery without angina pectoris: Secondary | ICD-10-CM | POA: Diagnosis not present

## 2015-01-05 DIAGNOSIS — J449 Chronic obstructive pulmonary disease, unspecified: Secondary | ICD-10-CM | POA: Diagnosis not present

## 2015-01-05 DIAGNOSIS — Z87891 Personal history of nicotine dependence: Secondary | ICD-10-CM | POA: Insufficient documentation

## 2015-01-05 DIAGNOSIS — I509 Heart failure, unspecified: Secondary | ICD-10-CM | POA: Diagnosis not present

## 2015-01-05 DIAGNOSIS — R042 Hemoptysis: Secondary | ICD-10-CM

## 2015-01-05 MED ORDER — LEVOFLOXACIN 750 MG PO TABS: 750.0000 mg | ORAL_TABLET | Freq: Every day | ORAL | Status: DC

## 2015-01-05 NOTE — Progress Notes (Signed)
Name: Keith Watts   MRN: [id number]    DOB: Sep 24, 1940   Date:01/05/2015        Progress Note  Subjective  Chief Complaint  Chief Complaint  Patient presents with  . Hemoptysis    Cough This is a new problem. The current episode started in the past 7 days (Friday pm). The problem has been unchanged. The problem occurs every few hours (5-6 times daily). The cough is productive of bloody sputum. Associated symptoms include hemoptysis, postnasal drip, a rash, rhinorrhea, shortness of breath and wheezing. Pertinent negatives include no chest pain, chills, ear congestion, ear pain, fever, headaches, heartburn, myalgias, nasal congestion, sore throat, sweats or weight loss. The symptoms are aggravated by lying down. His past medical history is significant for COPD and emphysema.     No problem-specific assessment & plan notes found for this encounter.   Past Medical History  Diagnosis Date  . COPD (chronic obstructive pulmonary disease)   . Pneumonia     a. admission to [facility] 09/2014 & 11/2014  . CAD (coronary artery disease)     a. 1 vessel CAD by cardiac cath in 2010 managed medically; b. cath 11/2014 mLAD 30%, mLCx 99% s/p PCI/DES 0%, mRCA 40%, mod calcified coronaries, nl LVSF, mod elevated LVEDP, mild AS  . Chronic respiratory failure     a. on 3L O2 via nasal cannula  . BPH (benign prostatic hyperplasia)   . Diastolic dysfunction     a. echo 09/2014: EF 50-55%, impaired relaxation of LV diastolic filling, nl RV size & sys fxn, mildly dilated LA, mild MR, mild Ao stenosis, nl PASP  . Vitamin D deficiency   . Allergic rhinitis   . Chicken pox   . Measles   . Mumps     History  Substance Use Topics  . Smoking status: Former Smoker -- 2.00 packs/day for 50 years    Quit date: 06/27/2008  . Smokeless tobacco: Never Used  . Alcohol Use: No     Current outpatient prescriptions:  .  aspirin EC 81 MG tablet, Take 81 mg by mouth daily., Disp: , Rfl:  .  atorvastatin  (LIPITOR) 40 MG tablet, Take 1 tablet (40 mg total) by mouth daily at 6 PM., Disp: 30 tablet, Rfl: 6 .  clopidogrel (PLAVIX) 75 MG tablet, Take 1 tablet (75 mg total) by mouth daily with breakfast., Disp: 30 tablet, Rfl: 6 .  finasteride (PROSCAR) 5 MG tablet, Take 5 mg by mouth at bedtime., Disp: , Rfl:  .  Fluticasone-Salmeterol (ADVAIR DISKUS) 250-50 MCG/DOSE AEPB, Inhale into the lungs., Disp: , Rfl:  .  furosemide (LASIX) 20 MG tablet, Take 1 tablet (20 mg total) by mouth daily., Disp: 30 tablet, Rfl: 5 .  OXYGEN, , Disp: , Rfl:  .  Vitamin D, Ergocalciferol, (DRISDOL) 50000 UNITS CAPS capsule, Take 50,000 Units by mouth every 7 (seven) days., Disp: , Rfl:   No Known Allergies  Review of Systems  Constitutional: Negative for fever, chills and weight loss.  HENT: Positive for postnasal drip and rhinorrhea. Negative for ear pain and sore throat.   Respiratory: Positive for cough, hemoptysis, shortness of breath and wheezing.   Cardiovascular: Negative for chest pain.  Gastrointestinal: Negative for heartburn.  Musculoskeletal: Negative for myalgias.  Skin: Positive for rash.  Neurological: Negative for headaches.    BP Readings from Last 3 Encounters:  01/05/15 90/48  12/25/14 70/40  12/17/14 86/52     Objective  Filed Vitals:  01/05/15 1116 01/05/15 1141  BP: 90/48   Pulse: 96   Temp: 98.1 F (36.7 C)   TempSrc: Oral   Resp: 32   SpO2: 96% 96%      Physical Exam  Blood pressure 90/48, pulse 96, temperature 98.1 F (36.7 C), temperature source Oral, resp. rate 32, SpO2 96 %. O2 = 3lpm   General Appearance:    Alert, cooperative, no distress  ENT:    Clear  Eyes:    PERRL, conjunctiva/corneas clear, EOM's intact       Lungs:     Poor air movement. Few faint basilar rales.   Heart:    Regular rate and rhythm  Neurologic:   Awake, alert, oriented x 3. No apparent focal neurological           defect.          Assessment & Plan  1. Hemoptysis History of  severe COPD with frequent hospitalizations for pneumonia and COPD exacerbation.  - DG Chest 2 View; Future  Chest XR finds new interstitial infiltrates. RX Levaquin 750 QD x 7 days sent to pharmacy to start taking today. He already has appointment scheduled 01-08-15 and will see then to make sure he is improving.

## 2015-01-05 NOTE — Telephone Encounter (Signed)
Pt has been coughing since Friday night. Pt also has been coughing up blood. Pt's wife is concerned due to the blood. Pt denies chest pain/ tightness. Pt scheduled to come in today 1100. Allene DillonEmily Drozdowski, CMA

## 2015-01-05 NOTE — Telephone Encounter (Signed)
-----   Message from Malva Limesonald E Fisher, MD sent at 01/05/2015  3:27 PM EDT ----- Chest XR show new pneumonia that was not there on last XR. Need to start levofloxacin 750mg  one tablet daily for 7 days. Need to start antibiotic today.

## 2015-01-05 NOTE — Telephone Encounter (Signed)
Rx sent to pharmacy   

## 2015-01-06 ENCOUNTER — Telehealth: Payer: Self-pay

## 2015-01-06 NOTE — Telephone Encounter (Signed)
Spoke with both pt and his wife; both report that pt has started abx. He is not worsening, though pt is not improving much either. Advised pt to call if worsening, or if sx have not improved with abx over the next few days. Allene DillonEmily Drozdowski, CMA

## 2015-01-06 NOTE — Telephone Encounter (Signed)
-----   Message from Malva Limesonald E Fisher, MD sent at 01/06/2015  8:53 AM EDT ----- Regarding: Follow up pneumonia Please check with patient to make sure he was able to get started on levoquin, and if he is any better or worse today. Make sure he has had no fever and see if breathing has changed since yesterday. Thanks.

## 2015-01-08 ENCOUNTER — Encounter: Payer: Self-pay | Admitting: Physician Assistant

## 2015-01-08 ENCOUNTER — Ambulatory Visit (INDEPENDENT_AMBULATORY_CARE_PROVIDER_SITE_OTHER): Payer: PPO | Admitting: Physician Assistant

## 2015-01-08 VITALS — BP 86/48 | HR 104 | Temp 98.1°F | Resp 26

## 2015-01-08 DIAGNOSIS — J441 Chronic obstructive pulmonary disease with (acute) exacerbation: Secondary | ICD-10-CM | POA: Diagnosis not present

## 2015-01-08 DIAGNOSIS — J189 Pneumonia, unspecified organism: Secondary | ICD-10-CM

## 2015-01-08 DIAGNOSIS — Z87898 Personal history of other specified conditions: Secondary | ICD-10-CM | POA: Insufficient documentation

## 2015-01-08 DIAGNOSIS — J449 Chronic obstructive pulmonary disease, unspecified: Secondary | ICD-10-CM

## 2015-01-08 DIAGNOSIS — Z8709 Personal history of other diseases of the respiratory system: Secondary | ICD-10-CM

## 2015-01-08 NOTE — Progress Notes (Signed)
Subjective:     Patient ID: Keith Watts, male   DOB: Dec 03, 1940, 74 y.o.   MRN: [id number]  HPI Franklyn LorHenry Fye is a 74 year old male that returns to the office today to follow-up on a fall that he had approximately 2 weeks ago where he fell into a door knob. He is also here to follow up on hemoptysis that started last Friday, 01/02/2015. He was seen by Dr. Sherrie MustacheFisher on Monday, 01/05/2015 and had another chest x-ray which revealed a new infiltrate in the left lung apex for which she was started on Levaquin. He has been compliant with the medication. He states that the hemoptysis stopped on Tuesday and he has not had any further hemoptysis since. He also reports that his cough has decreased. His back has been healing well and the ecchymosis and hematoma are almost completely subsided.  Review of Systems  HENT: Negative.   Eyes: Negative.   Respiratory: Positive for shortness of breath (feels he is back to baseline now) and wheezing. Negative for cough and chest tightness.   Cardiovascular: Negative for chest pain, palpitations and leg swelling.  Gastrointestinal: Negative.   Endocrine: Negative.   Musculoskeletal: Negative for myalgias, back pain and arthralgias.  Skin: Negative.   Neurological: Positive for weakness (at baseline). Negative for dizziness, seizures and light-headedness.  Hematological: Bruises/bleeds easily (on plavix).       Objective:   Physical Exam  Constitutional: He appears well-developed and well-nourished. No distress.  Pulmonary/Chest: Accessory muscle usage present. No tachypnea and no bradypnea. No respiratory distress. He has decreased breath sounds (throughout). He has wheezes (throughout). He has rales.  Decreased breath sounds with bibasilar rales  Skin: Skin is warm and dry. Bruising (on arms bilateral and on mid back (which is almost completely healed now-still mild discoloration)) noted. He is not diaphoretic.  Vitals reviewed.      Assessment:     1.  COPD Exacerbation   2. Chronic obstructive pulmonary disease, unspecified COPD, unspecified chronic bronchitis type   3. History of hemoptysis   4. Recurrent pneumonia         Plan:     1. COPD Exacerbation Will order future CXR in one month for f/u of the new infiltrate seen on most recent CXR to check for clearance.  He does f/u with Dr. Meredeth IdeFleming in October 2016. - DG Chest 2 View; Future  2. Chronic obstructive pulmonary disease, unspecified COPD, unspecified chronic bronchitis type Continue current medical treatment plan.  3. History of hemoptysis Improved on levaquin.  Continue antibiotic until completed.  F/U if hemoptysis returns.  Will obtain CXR in one month to evaluate for clearing of left lung apex infiltrate. - DG Chest 2 View; Future  4. Recurrent pneumonia Improving on levaquin.  Will obtain CXR for clearance in one month.

## 2015-01-08 NOTE — Patient Instructions (Signed)

## 2015-01-12 ENCOUNTER — Ambulatory Visit (INDEPENDENT_AMBULATORY_CARE_PROVIDER_SITE_OTHER): Payer: PPO | Admitting: Cardiovascular Disease

## 2015-01-12 ENCOUNTER — Encounter: Payer: Self-pay | Admitting: Cardiovascular Disease

## 2015-01-12 VITALS — BP 94/57 | HR 96 | Ht 63.0 in | Wt 131.0 lb

## 2015-01-12 DIAGNOSIS — I5032 Chronic diastolic (congestive) heart failure: Secondary | ICD-10-CM

## 2015-01-12 DIAGNOSIS — I2510 Atherosclerotic heart disease of native coronary artery without angina pectoris: Secondary | ICD-10-CM | POA: Diagnosis not present

## 2015-01-12 DIAGNOSIS — E785 Hyperlipidemia, unspecified: Secondary | ICD-10-CM | POA: Diagnosis not present

## 2015-01-12 NOTE — Assessment & Plan Note (Signed)
He is doing well with no symptoms suggestive of angina. Continue dual antiplatelets therapy for at least one year. He does have significant bruising but there are no other signs of bleeding. Hemoptysis was in the setting of pneumonia which resolved after treatment with an antibody.

## 2015-01-12 NOTE — Patient Instructions (Signed)
Medication Instructions: Continue same medications.   Labwork: None.   Procedures/Testing: None.   Follow-Up: 6 months with Dr. Cem Kosman.   Any Additional Special Instructions Will Be Listed Below (If Applicable).   

## 2015-01-12 NOTE — Assessment & Plan Note (Signed)
He appears to be euvolemic on small dose furosemide. 

## 2015-01-12 NOTE — Assessment & Plan Note (Signed)
Continue treatment with atorvastatin with a target LDL of less than 70. 

## 2015-01-12 NOTE — Progress Notes (Signed)
Primary care physician: Dr. Sherrie Mustache  HPI  This is a pleasant 74 year old male who is here today for a follow-up visit regarding coronary artery disease .He has known history of COPD on home oxygen since 2008, previous tobacco use and hyperlipidemia. He was hospitalized in June of this year with pneumonia and respiratory failure. He was noted to have elevated troponin suggestive of non-ST elevation myocardial infarction. I proceeded with cardiac catheterization via the right radial artery which showed mild LAD and RCA disease. There was 99% mid left circumflex stenosis which was treated successfully with drug-eluting stent placement. LV systolic function was normal. However, LVEDP was 24 mmHg. He was started on Lasix.  Upon follow-up, he was noted to be hypotensive. Thus, I temporarily suspended Lasix and stopped metoprolol. Labs showed no evidence of volume depletion. Thus, I resumed Lasix at 20 mg once daily. He did have blood tinged sputum and was seen by Dr. Sherrie Mustache. He had an x-ray done which was suggestive of pneumonia. He was treated with high-dose Levaquin with subsequent resolution. He has been doing reasonably well and reports overall improvement in symptoms. His mobility continues to be limited. His wife is the caregiver.  No Known Allergies   Current Outpatient Prescriptions on File Prior to Visit  Medication Sig Dispense Refill  . aspirin EC 81 MG tablet Take 81 mg by mouth daily.    Marland Kitchen atorvastatin (LIPITOR) 40 MG tablet Take 1 tablet (40 mg total) by mouth daily at 6 PM. 30 tablet 6  . clopidogrel (PLAVIX) 75 MG tablet Take 1 tablet (75 mg total) by mouth daily with breakfast. 30 tablet 6  . finasteride (PROSCAR) 5 MG tablet Take 5 mg by mouth at bedtime.    . Fluticasone-Salmeterol (ADVAIR DISKUS) 250-50 MCG/DOSE AEPB Inhale into the lungs.    . furosemide (LASIX) 20 MG tablet Take 1 tablet (20 mg total) by mouth daily. 30 tablet 5  . OXYGEN 3 L.     . Vitamin D, Ergocalciferol,  (DRISDOL) 50000 UNITS CAPS capsule Take 50,000 Units by mouth every 7 (seven) days.     No current facility-administered medications on file prior to visit.     Past Medical History  Diagnosis Date  . COPD (chronic obstructive pulmonary disease)   . Pneumonia     a. admission to [facility] 09/2014 & 11/2014  . CAD (coronary artery disease)     a. 1 vessel CAD by cardiac cath in 2010 managed medically; b. cath 11/2014 mLAD 30%, mLCx 99% s/p PCI/DES 0%, mRCA 40%, mod calcified coronaries, nl LVSF, mod elevated LVEDP, mild AS  . Chronic respiratory failure     a. on 3L O2 via nasal cannula  . BPH (benign prostatic hyperplasia)   . Diastolic dysfunction     a. echo 09/2014: EF 50-55%, impaired relaxation of LV diastolic filling, nl RV size & sys fxn, mildly dilated LA, mild MR, mild Ao stenosis, nl PASP  . Vitamin D deficiency   . Allergic rhinitis   . Chicken pox   . Measles   . Mumps      Past Surgical History  Procedure Laterality Date  . Eye surgery    . Appendectomy    . Tonsillectomy    . Cardiac catheterization N/A 11/28/2014    Procedure: Left Heart Cath and Coronary Angiography;  Surgeon: Iran Ouch, MD;  Location: [facility] INVASIVE CV LAB;  Service: Cardiovascular;  Laterality: N/A;  please schedule for lunch time with Dr. Kirke Corin, MD  . Cardiac  catheterization N/A 11/28/2014    Procedure: Coronary Stent Intervention;  Surgeon: Iran OuchMuhammad A Mathis Cashman, MD;  Location: [facility] INVASIVE CV LAB;  Service: Cardiovascular;  Laterality: N/A;     Family History  Problem Relation Age of Onset  . Cancer - Lung Mother   . Cancer - Colon Father   . Colon cancer Father   . Liver cancer Brother   . Pancreatic cancer Brother   . Prostate cancer Brother   . Liver cancer Brother      History   Social History  . Marital Status: Married    Spouse Name: N/A  . Number of Children: N/A  . Years of Education: N/A   Occupational History  . [employer]    Social History Main Topics  . Smoking status:  Former Smoker -- 2.00 packs/day for 50 years    Quit date: 06/27/2008  . Smokeless tobacco: Never Used  . Alcohol Use: No  . Drug Use: No  . Sexual Activity: Not on file   Other Topics Concern  . Not on file   Social History Narrative       PHYSICAL EXAM    BP 94/57 mmHg  Pulse 96  Ht 5\' 3"  (1.6 m)  Wt 131 lb (59.421 kg)  BMI 23.21 kg/m2 Constitutional: He is oriented to person, place, and time. He appears well-developed and well-nourished. No distress.  HENT: No nasal discharge.  Head: Normocephalic and atraumatic.  Eyes: Pupils are equal and round.  No discharge. Neck: Normal range of motion. Neck supple. No JVD present. No thyromegaly present.  Cardiovascular: Normal rate, regular rhythm, normal heart sounds. Exam reveals no gallop and no friction rub. No murmur heard.  Pulmonary/Chest: Diminished breath sounds bilaterally with bronchial breath sounds at the base. He is mildly tachypneic.  Abdominal: Soft. Bowel sounds are normal. He exhibits no distension. There is no tenderness. There is no rebound and no guarding.  Musculoskeletal: Normal range of motion. He exhibits no edema and no tenderness.  Neurological: He is alert and oriented to person, place, and time. Coordination normal.  Skin: Skin is warm and dry. No rash noted. He is not diaphoretic. No erythema. No pallor.  Psychiatric: He has a normal mood and affect. His behavior is normal. Judgment and thought content normal.          ASSESSMENT AND PLAN

## 2015-02-17 DIAGNOSIS — J449 Chronic obstructive pulmonary disease, unspecified: Secondary | ICD-10-CM | POA: Diagnosis not present

## 2015-02-17 DIAGNOSIS — I214 Non-ST elevation (NSTEMI) myocardial infarction: Secondary | ICD-10-CM | POA: Diagnosis not present

## 2015-02-17 DIAGNOSIS — J189 Pneumonia, unspecified organism: Secondary | ICD-10-CM | POA: Diagnosis not present

## 2015-02-17 DIAGNOSIS — J9610 Chronic respiratory failure, unspecified whether with hypoxia or hypercapnia: Secondary | ICD-10-CM | POA: Diagnosis not present

## 2015-03-30 ENCOUNTER — Encounter: Payer: Self-pay | Admitting: Family Medicine

## 2015-03-30 ENCOUNTER — Ambulatory Visit (INDEPENDENT_AMBULATORY_CARE_PROVIDER_SITE_OTHER): Payer: PPO | Admitting: Family Medicine

## 2015-03-30 VITALS — BP 102/60 | HR 84 | Temp 98.4°F | Resp 20

## 2015-03-30 DIAGNOSIS — J441 Chronic obstructive pulmonary disease with (acute) exacerbation: Secondary | ICD-10-CM

## 2015-03-30 MED ORDER — LEVOFLOXACIN 500 MG PO TABS: 500.0000 mg | ORAL_TABLET | Freq: Every day | ORAL | Status: AC

## 2015-03-30 MED ORDER — PREDNISONE 10 MG PO TABS: ORAL_TABLET | ORAL | Status: AC

## 2015-03-30 NOTE — Progress Notes (Signed)
Patient: Keith Watts Male    DOB: March 20, 1941   74 y.o.   MRN: [id number] Visit Date: 03/30/2015  Today's Provider: [name], MD   Chief Complaint  Patient presents with  . Cough  . Nasal Congestion   Subjective:    Cough This is a recurrent problem. The current episode started 1 to 4 weeks ago. The problem has been gradually worsening. The problem occurs constantly. The cough is productive of purulent sputum. Associated symptoms include nasal congestion, postnasal drip, shortness of breath and wheezing. Pertinent negatives include no chest pain, chills, ear congestion, ear pain, fever, headaches, sore throat or sweats. Nothing aggravates the symptoms. He has tried nothing for the symptoms.   He does have history of severe COPD followed by Dr. Meredeth Ide.    No Known Allergies Previous Medications   ASPIRIN EC 81 MG TABLET    Take 81 mg by mouth daily.   ATORVASTATIN (LIPITOR) 40 MG TABLET    Take 1 tablet (40 mg total) by mouth daily at 6 PM.   CLOPIDOGREL (PLAVIX) 75 MG TABLET    Take 1 tablet (75 mg total) by mouth daily with breakfast.   FINASTERIDE (PROSCAR) 5 MG TABLET    Take 5 mg by mouth at bedtime.   FLUTICASONE-SALMETEROL (ADVAIR DISKUS) 250-50 MCG/DOSE AEPB    Inhale into the lungs.   FUROSEMIDE (LASIX) 20 MG TABLET    Take 1 tablet (20 mg total) by mouth daily.   OXYGEN    3 L.    VITAMIN D, ERGOCALCIFEROL, (DRISDOL) 50000 UNITS CAPS CAPSULE    Take 50,000 Units by mouth every 7 (seven) days.    Review of Systems  Constitutional: Negative for fever and chills.  HENT: Positive for nosebleeds and postnasal drip. Negative for ear pain, sneezing and sore throat.   Respiratory: Positive for cough, shortness of breath and wheezing.   Cardiovascular: Negative for chest pain.  Neurological: Negative for dizziness, light-headedness and headaches.    Social History  Substance Use Topics  . Smoking status: Former Smoker -- 2.00 packs/day for 50 years   Quit date: 06/27/2008  . Smokeless tobacco: Never Used  . Alcohol Use: No   Objective:   BP 102/60 mmHg  Pulse 84  Temp(Src) 98.4 F (36.9 C) (Oral)  Resp 20  SpO2 96% (3lpm O2)  Physical Exam  General Appearance:    Alert, cooperative, no distress  HENT:   ENT exam normal, no neck nodes or sinus tenderness  Eyes:    PERRL, conjunctiva/corneas clear, EOM's intact       Lungs:     Occasional mild expiratory wheezes bilaterally, no rales respirations unlabored  Heart:    Regular rate and rhythm  Neurologic:   Awake, alert, oriented x 3. No apparent focal neurological           defect.         Assessment & Plan:     1. COPD Exacerbation  - levofloxacin (LEVAQUIN) 500 MG tablet; Take 1 tablet (500 mg total) by mouth daily.  Dispense: 7 tablet; Refill: 0 - predniSONE (DELTASONE) 10 MG tablet; 6 tablets for 1 day, then 5 for 1 day, then 4 for 1 day, then 3 for 1 day, then 2 for 1 day then 1 for 1 day.  Dispense: 21 tablet; Refill: 0  Call if symptoms change or if not rapidly improving.          [name], MD  [facility]

## 2015-06-09 ENCOUNTER — Other Ambulatory Visit: Payer: Self-pay

## 2015-06-09 MED ORDER — FUROSEMIDE 20 MG PO TABS: 20.0000 mg | ORAL_TABLET | Freq: Every day | ORAL | Status: DC

## 2015-06-09 NOTE — Telephone Encounter (Signed)
Refill sent for Furosemide 20 mg  

## 2015-07-20 ENCOUNTER — Other Ambulatory Visit: Payer: Self-pay | Admitting: Family Medicine

## 2015-10-12 ENCOUNTER — Other Ambulatory Visit: Payer: Self-pay | Admitting: Family Medicine

## 2015-10-20 ENCOUNTER — Encounter: Payer: Self-pay | Admitting: Cardiovascular Disease

## 2015-10-20 ENCOUNTER — Ambulatory Visit (INDEPENDENT_AMBULATORY_CARE_PROVIDER_SITE_OTHER): Payer: Medicare Other | Admitting: Cardiovascular Disease

## 2015-10-20 VITALS — BP 100/54 | HR 67 | Ht 63.0 in | Wt 135.0 lb

## 2015-10-20 DIAGNOSIS — I2510 Atherosclerotic heart disease of native coronary artery without angina pectoris: Secondary | ICD-10-CM

## 2015-10-20 DIAGNOSIS — I5032 Chronic diastolic (congestive) heart failure: Secondary | ICD-10-CM | POA: Diagnosis not present

## 2015-10-20 NOTE — Progress Notes (Signed)
Cardiology Office Note   Date:  10/20/2015   ID:  Keith Watts 1940-09-12, MRN [id number]  PCP:  [name], MD  Cardiologist:   Lorine Bears, MD   Chief Complaint  Patient presents with  . other    6 month f/u. Meds reviewd verbally with pt.      History of Present Illness: Keith Watts is a 75 y.o. male who presents for  a follow-up visit regarding coronary artery disease .He has known history of COPD on home oxygen since 2008, previous tobacco use and hyperlipidemia.  He was hospitalized in June of 2016 with pneumonia and respiratory failure. He was noted to have elevated troponin suggestive of non-ST elevation myocardial infarction. I proceeded with cardiac catheterization via the right radial artery which showed mild LAD and RCA disease. There was 99% mid left circumflex stenosis which was treated successfully with drug-eluting stent placement. LV systolic function was normal with moderately elevated left ventricular end-diastolic pressure. He has been doing reasonably well and denies any chest pain. Exertional dyspnea is stable. He continues to use oxygen. He is taking his medications regularly. Blood pressure continues to be borderline low with no episodes of syncope.    Past Medical History  Diagnosis Date  . COPD (chronic obstructive pulmonary disease) (HCC)   . Pneumonia     a. admission to [facility] 09/2014 & 11/2014  . CAD (coronary artery disease)     a. 1 vessel CAD by cardiac cath in 2010 managed medically; b. cath 11/2014 mLAD 30%, mLCx 99% s/p PCI/DES 0%, mRCA 40%, mod calcified coronaries, nl LVSF, mod elevated LVEDP, mild AS  . Chronic respiratory failure (HCC)     a. on 3L O2 via nasal cannula  . BPH (benign prostatic hyperplasia)   . Diastolic dysfunction     a. echo 09/2014: EF 50-55%, impaired relaxation of LV diastolic filling, nl RV size & sys fxn, mildly dilated LA, mild MR, mild Ao stenosis, nl PASP  . Vitamin D deficiency   . Allergic  rhinitis   . Chicken pox   . Measles   . Mumps     Past Surgical History  Procedure Laterality Date  . Eye surgery    . Appendectomy    . Tonsillectomy    . Cardiac catheterization N/A 11/28/2014    Procedure: Left Heart Cath and Coronary Angiography;  Surgeon: Iran Ouch, MD;  Location: [facility] INVASIVE CV LAB;  Service: Cardiovascular;  Laterality: N/A;  please schedule for lunch time with Dr. Kirke Corin, MD  . Cardiac catheterization N/A 11/28/2014    Procedure: Coronary Stent Intervention;  Surgeon: Iran Ouch, MD;  Location: [facility] INVASIVE CV LAB;  Service: Cardiovascular;  Laterality: N/A;     Current Outpatient Prescriptions  Medication Sig Dispense Refill  . aspirin EC 81 MG tablet Take 81 mg by mouth daily.    Marland Kitchen atorvastatin (LIPITOR) 40 MG tablet TAKE 1 TABLET (40 MG TOTAL) BY MOUTH DAILY AT 6 PM. 30 tablet 6  . clopidogrel (PLAVIX) 75 MG tablet TAKE 1 TABLET BY MOUTH DAILY WITH BREAKFAST. 30 tablet 6  . finasteride (PROSCAR) 5 MG tablet Take 5 mg by mouth at bedtime.    . Fluticasone-Salmeterol (ADVAIR DISKUS) 250-50 MCG/DOSE AEPB Inhale into the lungs.    . furosemide (LASIX) 20 MG tablet Take 1 tablet (20 mg total) by mouth daily. 30 tablet 5  . OXYGEN 3 L.     . Vitamin D, Ergocalciferol, (DRISDOL) 50000 UNITS  CAPS capsule Take 50,000 Units by mouth every 7 (seven) days.     No current facility-administered medications for this visit.    Allergies:   Review of patient's allergies indicates no known allergies.    Social History:  The patient  reports that he quit smoking about 7 years ago. He has never used smokeless tobacco. He reports that he does not drink alcohol or use illicit drugs.   Family History:  The patient's family history includes Cancer - Colon in his father; Cancer - Lung in his mother; Colon cancer in his father; Liver cancer in his brother and brother; Pancreatic cancer in his brother; Prostate cancer in his brother.    ROS:  Please see the  history of present illness.   Otherwise, review of systems are positive for none.   All other systems are reviewed and negative.    PHYSICAL EXAM: VS:  BP 100/54 mmHg  Pulse 67  Ht 5\' 3"  (1.6 m)  Wt 135 lb (61.236 kg)  BMI 23.92 kg/m2 , BMI Body mass index is 23.92 kg/(m^2). GEN: Well nourished, well developed, in no acute distress HEENT: normal Neck: no JVD, carotid bruits, or masses Cardiac: RRR with frequent premature beats; no murmurs, rubs, or gallops,no edema  Respiratory:  clear to auscultation bilaterally with diminished breath sounds, normal work of breathing GI: soft, nontender, nondistended, + BS MS: no deformity or atrophy Skin: warm and dry, no rash Neuro:  Strength and sensation are intact Psych: euthymic mood, full affect   EKG:  EKG is ordered today. The ekg ordered today demonstrates sinus rhythm with frequent PACs, left anterior fascicular block   Recent Labs: 11/26/2014: ALT 18 11/27/2014: Magnesium 2.1 11/29/2014: Hemoglobin 10.2* 12/15/2014: BUN 24; Creatinine, Ser 1.08; Platelets 166; Potassium 4.9; Sodium 144    Lipid Panel    Component Value Date/Time   CHOL 154 10/24/2014 0755   TRIG 98 10/24/2014 0755   HDL 40 10/24/2014 0755   VLDL 20 10/24/2014 0755   LDLCALC 94 10/24/2014 0755      Wt Readings from Last 3 Encounters:  10/20/15 135 lb (61.236 kg)  01/12/15 131 lb (59.421 kg)  07/12/06 125 lb (56.7 kg)        ASSESSMENT AND PLAN:  1.  Coronary artery disease involving native coronary arteries without angina: He is overall doing very well with no anginal symptoms. Continue medical therapy. Plavix can be discontinued after June. He did have extensive bruising initially but that does not seem to be an issue right now.  2. Hyperlipidemia: Continue treatment with atorvastatin. Recommend a target LDL of less than 70. Most recent LDL  3. Chronic diastolic heart failure: He appears to be euvolemic on small dose furosemide.  4. COPD: Followed by  Dr. Meredeth IdeFleming and seems to be stable.    Disposition:   FU with me in 6 months  Signed,  Lorine BearsMuhammad Arida, MD  10/20/2015 2:36 PM    [facility]

## 2015-10-20 NOTE — Patient Instructions (Signed)
Medication Instructions: Continue same medications.   Labwork: None.   Procedures/Testing: None.   Follow-Up: 6 months with Dr. Arida.   Any Additional Special Instructions Will Be Listed Below (If Applicable).     If you need a refill on your cardiac medications before your next appointment, please call your pharmacy.   

## 2015-11-09 ENCOUNTER — Telehealth: Payer: Self-pay

## 2015-11-09 NOTE — Telephone Encounter (Signed)
Patient has scheduled and appt with you for CPE tomorrow, but his wife just called and said that the patient has been coughing up blood for the last 2 days. She reports that the blood is mixed in with his sputum. She reports that the patient has had URI symptoms for 1-2 weeks, but just noticed the blood tinged sputum 2 days ago. Denies any other symptoms such as fever, shortness of breath, chest pain, headaches, or dizziness. Can patient discuss this with you on his visit tomorrow as well? Please advise. Thanks!

## 2015-11-09 NOTE — Telephone Encounter (Signed)
Sarah,   I think this is a question related to billing for wellness visit and problem visits.  Of course he can discuss this problem.. If there are any medical problems treated at the visit there is an evaluation and management charge just like any other time he is seen. If he is well enough to have his physical, this will incur a separate charge.   If he develops any fever, shortness of of breath, chest pain, or dizziness before his o.v. Tomorrow he needs to go to ER or urgent care.

## 2015-11-10 ENCOUNTER — Other Ambulatory Visit
Admission: RE | Admit: 2015-11-10 | Discharge: 2015-11-10 | Disposition: A | Discharge status: Home / Self Care | Payer: Medicare Other | Source: Ambulatory Visit | Attending: Family Medicine | Admitting: Family Medicine

## 2015-11-10 ENCOUNTER — Encounter: Payer: Self-pay | Admitting: Family Medicine

## 2015-11-10 ENCOUNTER — Ambulatory Visit
Admission: RE | Admit: 2015-11-10 | Discharge: 2015-11-10 | Disposition: A | Discharge status: Home / Self Care | Payer: Medicare Other | Source: Ambulatory Visit | Attending: Family Medicine | Admitting: Family Medicine

## 2015-11-10 ENCOUNTER — Ambulatory Visit (INDEPENDENT_AMBULATORY_CARE_PROVIDER_SITE_OTHER): Payer: Medicare Other | Admitting: Family Medicine

## 2015-11-10 VITALS — BP 110/60 | HR 65 | Temp 97.8°F | Resp 20 | Ht 63.5 in | Wt 138.0 lb

## 2015-11-10 DIAGNOSIS — J984 Other disorders of lung: Secondary | ICD-10-CM | POA: Insufficient documentation

## 2015-11-10 DIAGNOSIS — J189 Pneumonia, unspecified organism: Secondary | ICD-10-CM

## 2015-11-10 DIAGNOSIS — R042 Hemoptysis: Secondary | ICD-10-CM

## 2015-11-10 LAB — PROTIME-INR
INR: 1
Prothrombin Time: 13.4 seconds (ref 11.4–15.0)

## 2015-11-10 LAB — CBC WITH DIFFERENTIAL/PLATELET
BASOS ABS: 0 10*3/uL (ref 0–0.1)
Basophils Relative: 1 %
Eosinophils Absolute: 0.1 10*3/uL (ref 0–0.7)
Eosinophils Relative: 2 %
HEMATOCRIT: 36.9 % — AB (ref 40.0–52.0)
Hemoglobin: 11.8 g/dL — ABNORMAL LOW (ref 13.0–18.0)
Lymphocytes Relative: 16 %
Lymphs Abs: 1.2 10*3/uL (ref 1.0–3.6)
MCH: 28.5 pg (ref 26.0–34.0)
MCHC: 32 g/dL (ref 32.0–36.0)
MCV: 89.1 fL (ref 80.0–100.0)
MONO ABS: 0.6 10*3/uL (ref 0.2–1.0)
Monocytes Relative: 9 %
NEUTROS ABS: 5.3 10*3/uL (ref 1.4–6.5)
Neutrophils Relative %: 72 %
Platelets: 144 10*3/uL — ABNORMAL LOW (ref 150–440)
RBC: 4.14 MIL/uL — AB (ref 4.40–5.90)
RDW: 15 % — ABNORMAL HIGH (ref 11.5–14.5)
WBC: 7.3 10*3/uL (ref 3.8–10.6)

## 2015-11-10 LAB — COMPREHENSIVE METABOLIC PANEL
ALBUMIN: 3.9 g/dL (ref 3.5–5.0)
ALT: 17 U/L (ref 17–63)
AST: 25 U/L (ref 15–41)
Alkaline Phosphatase: 115 U/L (ref 38–126)
Anion gap: 7 (ref 5–15)
BUN: 20 mg/dL (ref 6–20)
CHLORIDE: 96 mmol/L — AB (ref 101–111)
CO2: 34 mmol/L — ABNORMAL HIGH (ref 22–32)
Calcium: 9 mg/dL (ref 8.9–10.3)
Creatinine, Ser: 0.97 mg/dL (ref 0.61–1.24)
GFR calc Af Amer: >60 mL/min (ref >60)
GFR calc non Af Amer: >60 mL/min (ref >60)
GLUCOSE: 91 mg/dL (ref 65–99)
Potassium: 4.3 mmol/L (ref 3.5–5.1)
Sodium: 137 mmol/L (ref 135–145)
Total Bilirubin: 0.4 mg/dL (ref 0.3–1.2)
Total Protein: 7.5 g/dL (ref 6.5–8.1)

## 2015-11-10 LAB — APTT: aPTT: 28 seconds (ref 24–36)

## 2015-11-10 MED ORDER — LEVOFLOXACIN 500 MG PO TABS: 500.0000 mg | ORAL_TABLET | Freq: Every day | ORAL | Status: AC

## 2015-11-10 NOTE — Telephone Encounter (Signed)
Called and left message for the patient to call back so I may explain.

## 2015-11-10 NOTE — Progress Notes (Signed)
Patient: Keith Watts Male    DOB: 15-Jun-1941   75 y.o.   MRN: [id number] Visit Date: 11/10/2015  Today's Provider: [name], MD   Chief Complaint  Patient presents with  . Cough    x 2 weeks   Subjective:    Cough This is a new problem. Episode onset: 2 weeks ago. The problem has been gradually worsening. The problem occurs constantly. The cough is productive of bloody sputum. Associated symptoms include chest pain (when coughing), hemoptysis, postnasal drip, rhinorrhea, shortness of breath (with exertion) and wheezing. Pertinent negatives include no chills, ear congestion, ear pain, fever, headaches, myalgias, nasal congestion, sore throat, sweats or weight loss. Exacerbated by: exertion. Treatments tried: Advair twice dailly. The treatment provided mild relief. His past medical history is significant for COPD.   He states he had persistent nose blood on 11-08-15 which has resolved. He started coughing up small amounts of blood yesterday. Has not been short of breath. Not wheezing. No fevers or chills.    No Known Allergies Previous Medications   ASPIRIN EC 81 MG TABLET    Take 81 mg by mouth daily.   ATORVASTATIN (LIPITOR) 40 MG TABLET    TAKE 1 TABLET (40 MG TOTAL) BY MOUTH DAILY AT 6 PM.   CLOPIDOGREL (PLAVIX) 75 MG TABLET    TAKE 1 TABLET BY MOUTH DAILY WITH BREAKFAST.   FINASTERIDE (PROSCAR) 5 MG TABLET    Take 5 mg by mouth at bedtime.   FLUTICASONE-SALMETEROL (ADVAIR DISKUS) 250-50 MCG/DOSE AEPB    Inhale 2 puffs into the lungs 2 (two) times daily.    FUROSEMIDE (LASIX) 20 MG TABLET    Take 1 tablet (20 mg total) by mouth daily.   OXYGEN    3 L continuous.    VITAMIN D, ERGOCALCIFEROL, (DRISDOL) 50000 UNITS CAPS CAPSULE    Take 50,000 Units by mouth every 7 (seven) days.    Review of Systems  Constitutional: Negative for fever, chills, weight loss and appetite change.  HENT: Positive for nosebleeds (started 2 days ago; now resolved), postnasal drip and  rhinorrhea. Negative for ear pain and sore throat.   Respiratory: Positive for cough, hemoptysis, shortness of breath (with exertion) and wheezing. Negative for chest tightness.        Coughing up blood  Cardiovascular: Positive for chest pain (when coughing). Negative for palpitations.  Gastrointestinal: Positive for diarrhea. Negative for nausea, vomiting and abdominal pain.  Genitourinary: Positive for frequency.  Musculoskeletal: Negative for myalgias.  Neurological: Negative for headaches.    Social History  Substance Use Topics  . Smoking status: Former Smoker -- 2.00 packs/day for 50 years    Quit date: 06/27/2008  . Smokeless tobacco: Never Used  . Alcohol Use: No   Objective:   BP 110/60 mmHg  Pulse 65  Temp(Src) 97.8 F (36.6 C) (Oral)  Resp 20  Ht 5' 3.5" (1.613 m)  Wt 138 lb (62.596 kg)  BMI 24.06 kg/m2  SpO2 95%  Physical Exam  General Appearance:    Alert, cooperative, no distress  HENT:   ENT exam normal, no neck nodes or sinus tenderness  Eyes:    PERRL, conjunctiva/corneas clear, EOM's intact       Lungs:     Clear to auscultation bilaterally, respirations unlabored  Heart:    Regular rate and rhythm  Neurologic:   Awake, alert, oriented x 3. No apparent focal neurological  defect.      Dg Chest 2 View  11/11/2015  CLINICAL DATA:  Cough and hemoptysis. Exertional shortness of breath and wheezing. EXAM: CHEST  2 VIEW COMPARISON:  01/05/2015 FINDINGS: The heart size and mediastinal contours are within normal limits. New focal density in the left lung likely is within the left upper lobe. This region measures approximately 3.6 cm and approaches the lateral subpleural lung. Findings are concerning for potential mass. Focal infiltrate is also a possibility. Stable chronic lung disease. Previously noted left apical infiltrate has resolved. No edema, pneumothorax or pleural fluid identified. The bony thorax shows stable osteopenia and spondylosis of the  thoracic spine. The visualized skeletal structures are unremarkable. IMPRESSION: New focal density in the left lung potentially representing neoplasm. A focal infiltrate is also a possibility. Close follow-up is recommended. There is no suspicion of pneumonia clinically, recommend evaluation with CT of the chest with contrast. If the patient is going to be treated for potential pneumonia, recommend follow-up chest radiograph after treatment to determine if the abnormality is persistent. Electronically Signed   By: Irish LackGlenn  Yamagata M.D.   On: 11/11/2015 09:07        Assessment & Plan:     1. Hemoptysis  - CBC - PTT - Protime-INR - Comprehensive metabolic panel - DG Chest 2 View; Future  2. Recurrent pneumonia  - levofloxacin (LEVAQUIN) 500 MG tablet; Take 1 tablet (500 mg total) by mouth daily.  Dispense: 10 tablet; Refill: 0        Mila Merryonald Anitria Andon, MD  [facility]

## 2015-11-10 NOTE — Patient Instructions (Addendum)
Go to the [facility] on [address] for Chest Xray  Then [facility] to [facility]. [facility]

## 2015-11-11 ENCOUNTER — Telehealth: Payer: Self-pay

## 2015-11-11 NOTE — Telephone Encounter (Signed)
-----   Message from Malva Limesonald E Fisher, MD sent at 11/11/2015  9:55 AM EDT ----- Herby AbrahamXray shows suspected area of pneumonia in left lung. Have sent rx for levoquin to take once a day. Labs show he is a little bit anemic, but otherwise normal. Needs to repeat chest Xr in 1 week to make sure chest Xr clears up.

## 2015-11-11 NOTE — Telephone Encounter (Signed)
LMTCB

## 2015-11-13 NOTE — Telephone Encounter (Signed)
Advised patient's wife on 11/11/15. Patient will have CXR repeated in 1 week.

## 2015-11-20 ENCOUNTER — Telehealth: Payer: Self-pay

## 2015-11-20 DIAGNOSIS — J189 Pneumonia, unspecified organism: Secondary | ICD-10-CM

## 2015-11-20 NOTE — Telephone Encounter (Signed)
Wife advised-aa 

## 2015-11-20 NOTE — Telephone Encounter (Signed)
Wife called stating that patient finished antibiotic and to see if we can order repeat chest xray and what else he needs to do. She states he is still coughing up some blood. Please review-aa

## 2015-11-20 NOTE — Telephone Encounter (Signed)
Order in epic. Left message for pt to return call.

## 2015-11-20 NOTE — Telephone Encounter (Signed)
Yes. He needs a follow up chest XR today.

## 2015-11-24 ENCOUNTER — Ambulatory Visit
Admission: RE | Admit: 2015-11-24 | Discharge: 2015-11-24 | Disposition: A | Discharge status: Home / Self Care | Payer: Medicare Other | Source: Ambulatory Visit | Attending: Family Medicine | Admitting: Family Medicine

## 2015-11-24 DIAGNOSIS — J449 Chronic obstructive pulmonary disease, unspecified: Secondary | ICD-10-CM | POA: Diagnosis not present

## 2015-11-24 DIAGNOSIS — Z8701 Personal history of pneumonia (recurrent): Secondary | ICD-10-CM | POA: Insufficient documentation

## 2015-11-24 DIAGNOSIS — J189 Pneumonia, unspecified organism: Secondary | ICD-10-CM

## 2015-11-24 DIAGNOSIS — Z09 Encounter for follow-up examination after completed treatment for conditions other than malignant neoplasm: Secondary | ICD-10-CM | POA: Diagnosis present

## 2015-11-25 ENCOUNTER — Telehealth: Payer: Self-pay | Admitting: *Deleted

## 2015-11-25 DIAGNOSIS — J189 Pneumonia, unspecified organism: Secondary | ICD-10-CM

## 2015-11-25 NOTE — Telephone Encounter (Signed)
-----   Message from Malva Limesonald E Fisher, MD sent at 11/25/2015  9:12 AM EDT ----- Chest xray still shows pneumonia. Needs chest Ct with contrast for further evaluation. Please advised and forward to sarah to schedule Ct (rule out tumor)

## 2015-11-25 NOTE — Telephone Encounter (Signed)
Patient's wife was notified of results. Expressed understanding. Order for CT is in epic.

## 2015-11-25 NOTE — Telephone Encounter (Signed)
Please schedule CT? Thanks! 

## 2015-12-01 ENCOUNTER — Ambulatory Visit
Admission: RE | Admit: 2015-12-01 | Discharge: 2015-12-01 | Disposition: A | Discharge status: Home / Self Care | Payer: Medicare Other | Source: Ambulatory Visit | Attending: Family Medicine | Admitting: Family Medicine

## 2015-12-01 DIAGNOSIS — J189 Pneumonia, unspecified organism: Secondary | ICD-10-CM | POA: Diagnosis not present

## 2015-12-01 MED ORDER — IOPAMIDOL (ISOVUE-300) INJECTION 61%
75.0000 mL | Freq: Once | INTRAVENOUS | Status: AC | PRN
  Administered 2015-12-01: 75 mL via INTRAVENOUS

## 2015-12-03 ENCOUNTER — Other Ambulatory Visit: Payer: Self-pay | Admitting: Specialist

## 2015-12-03 DIAGNOSIS — R0902 Hypoxemia: Secondary | ICD-10-CM

## 2015-12-09 ENCOUNTER — Other Ambulatory Visit: Payer: Self-pay | Admitting: Specialist

## 2015-12-09 DIAGNOSIS — R0902 Hypoxemia: Secondary | ICD-10-CM

## 2015-12-09 DIAGNOSIS — J181 Lobar pneumonia, unspecified organism: Secondary | ICD-10-CM

## 2015-12-09 DIAGNOSIS — J189 Pneumonia, unspecified organism: Secondary | ICD-10-CM

## 2015-12-10 ENCOUNTER — Ambulatory Visit
Admission: RE | Admit: 2015-12-10 | Discharge: 2015-12-10 | Disposition: A | Discharge status: Home / Self Care | Payer: Medicare Other | Source: Ambulatory Visit | Attending: Specialist | Admitting: Specialist

## 2015-12-10 DIAGNOSIS — J189 Pneumonia, unspecified organism: Secondary | ICD-10-CM

## 2015-12-10 DIAGNOSIS — R0902 Hypoxemia: Secondary | ICD-10-CM | POA: Diagnosis present

## 2015-12-10 DIAGNOSIS — J181 Lobar pneumonia, unspecified organism: Secondary | ICD-10-CM | POA: Diagnosis present

## 2015-12-10 DIAGNOSIS — I2510 Atherosclerotic heart disease of native coronary artery without angina pectoris: Secondary | ICD-10-CM | POA: Diagnosis not present

## 2015-12-10 DIAGNOSIS — J439 Emphysema, unspecified: Secondary | ICD-10-CM | POA: Insufficient documentation

## 2015-12-10 DIAGNOSIS — I700 Atherosclerosis of aorta: Secondary | ICD-10-CM | POA: Insufficient documentation

## 2015-12-10 DIAGNOSIS — R05 Cough: Secondary | ICD-10-CM | POA: Insufficient documentation

## 2016-01-08 ENCOUNTER — Other Ambulatory Visit: Payer: Self-pay

## 2016-01-08 MED ORDER — FUROSEMIDE 20 MG PO TABS: 20.0000 mg | ORAL_TABLET | Freq: Every day | ORAL | Status: AC

## 2016-01-08 NOTE — Telephone Encounter (Signed)
Refill sent for Furosemide 20 mg  

## 2016-02-01 ENCOUNTER — Other Ambulatory Visit: Payer: Self-pay | Admitting: Family Medicine

## 2016-02-03 ENCOUNTER — Other Ambulatory Visit: Payer: Self-pay | Admitting: Specialist

## 2016-02-03 DIAGNOSIS — R9389 Abnormal findings on diagnostic imaging of other specified body structures: Secondary | ICD-10-CM

## 2016-03-11 ENCOUNTER — Ambulatory Visit
Admission: RE | Admit: 2016-03-11 | Discharge: 2016-03-11 | Disposition: A | Discharge status: Home / Self Care | Payer: Medicare Other | Source: Ambulatory Visit | Attending: Specialist | Admitting: Specialist

## 2016-03-11 DIAGNOSIS — R9389 Abnormal findings on diagnostic imaging of other specified body structures: Secondary | ICD-10-CM

## 2016-03-11 DIAGNOSIS — J439 Emphysema, unspecified: Secondary | ICD-10-CM | POA: Insufficient documentation

## 2016-03-11 DIAGNOSIS — R938 Abnormal findings on diagnostic imaging of other specified body structures: Secondary | ICD-10-CM | POA: Diagnosis present

## 2016-04-13 ENCOUNTER — Emergency Department: Payer: Medicare Other

## 2016-04-13 ENCOUNTER — Encounter: Payer: Self-pay | Admitting: Emergency Medicine

## 2016-04-13 ENCOUNTER — Inpatient Hospital Stay
Admission: EM | Admit: 2016-04-13 | Discharge: 2016-04-27 | DRG: 870 | Disposition: E | Payer: Medicare Other | Attending: Pulmonary Disease | Admitting: Pulmonary Disease

## 2016-04-13 DIAGNOSIS — Z515 Encounter for palliative care: Secondary | ICD-10-CM | POA: Diagnosis not present

## 2016-04-13 DIAGNOSIS — Z87891 Personal history of nicotine dependence: Secondary | ICD-10-CM

## 2016-04-13 DIAGNOSIS — J440 Chronic obstructive pulmonary disease with acute lower respiratory infection: Secondary | ICD-10-CM | POA: Diagnosis present

## 2016-04-13 DIAGNOSIS — Z978 Presence of other specified devices: Secondary | ICD-10-CM

## 2016-04-13 DIAGNOSIS — G9341 Metabolic encephalopathy: Secondary | ICD-10-CM | POA: Diagnosis not present

## 2016-04-13 DIAGNOSIS — R0600 Dyspnea, unspecified: Secondary | ICD-10-CM

## 2016-04-13 DIAGNOSIS — R4020 Unspecified coma: Secondary | ICD-10-CM

## 2016-04-13 DIAGNOSIS — J9621 Acute and chronic respiratory failure with hypoxia: Secondary | ICD-10-CM | POA: Diagnosis not present

## 2016-04-13 DIAGNOSIS — E860 Dehydration: Secondary | ICD-10-CM | POA: Diagnosis present

## 2016-04-13 DIAGNOSIS — Z9981 Dependence on supplemental oxygen: Secondary | ICD-10-CM | POA: Diagnosis not present

## 2016-04-13 DIAGNOSIS — E870 Hyperosmolality and hypernatremia: Secondary | ICD-10-CM | POA: Diagnosis present

## 2016-04-13 DIAGNOSIS — Z7951 Long term (current) use of inhaled steroids: Secondary | ICD-10-CM

## 2016-04-13 DIAGNOSIS — J441 Chronic obstructive pulmonary disease with (acute) exacerbation: Secondary | ICD-10-CM | POA: Diagnosis present

## 2016-04-13 DIAGNOSIS — N400 Enlarged prostate without lower urinary tract symptoms: Secondary | ICD-10-CM | POA: Diagnosis present

## 2016-04-13 DIAGNOSIS — E875 Hyperkalemia: Secondary | ICD-10-CM | POA: Diagnosis present

## 2016-04-13 DIAGNOSIS — G252 Other specified forms of tremor: Secondary | ICD-10-CM | POA: Diagnosis not present

## 2016-04-13 DIAGNOSIS — R6521 Severe sepsis with septic shock: Secondary | ICD-10-CM | POA: Diagnosis present

## 2016-04-13 DIAGNOSIS — E559 Vitamin D deficiency, unspecified: Secondary | ICD-10-CM | POA: Diagnosis present

## 2016-04-13 DIAGNOSIS — N179 Acute kidney failure, unspecified: Secondary | ICD-10-CM | POA: Diagnosis present

## 2016-04-13 DIAGNOSIS — R739 Hyperglycemia, unspecified: Secondary | ICD-10-CM | POA: Diagnosis not present

## 2016-04-13 DIAGNOSIS — Z7982 Long term (current) use of aspirin: Secondary | ICD-10-CM

## 2016-04-13 DIAGNOSIS — I5033 Acute on chronic diastolic (congestive) heart failure: Secondary | ICD-10-CM | POA: Diagnosis present

## 2016-04-13 DIAGNOSIS — I639 Cerebral infarction, unspecified: Secondary | ICD-10-CM | POA: Diagnosis not present

## 2016-04-13 DIAGNOSIS — I2510 Atherosclerotic heart disease of native coronary artery without angina pectoris: Secondary | ICD-10-CM | POA: Diagnosis present

## 2016-04-13 DIAGNOSIS — I959 Hypotension, unspecified: Secondary | ICD-10-CM | POA: Diagnosis not present

## 2016-04-13 DIAGNOSIS — Z955 Presence of coronary angioplasty implant and graft: Secondary | ICD-10-CM

## 2016-04-13 DIAGNOSIS — E872 Acidosis: Secondary | ICD-10-CM | POA: Diagnosis present

## 2016-04-13 DIAGNOSIS — I248 Other forms of acute ischemic heart disease: Secondary | ICD-10-CM | POA: Diagnosis not present

## 2016-04-13 DIAGNOSIS — J449 Chronic obstructive pulmonary disease, unspecified: Secondary | ICD-10-CM | POA: Diagnosis not present

## 2016-04-13 DIAGNOSIS — Z01818 Encounter for other preprocedural examination: Secondary | ICD-10-CM

## 2016-04-13 DIAGNOSIS — R251 Tremor, unspecified: Secondary | ICD-10-CM | POA: Diagnosis not present

## 2016-04-13 DIAGNOSIS — A419 Sepsis, unspecified organism: Principal | ICD-10-CM | POA: Diagnosis present

## 2016-04-13 DIAGNOSIS — Z66 Do not resuscitate: Secondary | ICD-10-CM | POA: Diagnosis not present

## 2016-04-13 DIAGNOSIS — J189 Pneumonia, unspecified organism: Secondary | ICD-10-CM | POA: Diagnosis present

## 2016-04-13 DIAGNOSIS — Z452 Encounter for adjustment and management of vascular access device: Secondary | ICD-10-CM

## 2016-04-13 DIAGNOSIS — J309 Allergic rhinitis, unspecified: Secondary | ICD-10-CM | POA: Diagnosis present

## 2016-04-13 DIAGNOSIS — J9690 Respiratory failure, unspecified, unspecified whether with hypoxia or hypercapnia: Secondary | ICD-10-CM

## 2016-04-13 DIAGNOSIS — Z8042 Family history of malignant neoplasm of prostate: Secondary | ICD-10-CM

## 2016-04-13 DIAGNOSIS — N189 Chronic kidney disease, unspecified: Secondary | ICD-10-CM | POA: Diagnosis present

## 2016-04-13 DIAGNOSIS — R0902 Hypoxemia: Secondary | ICD-10-CM

## 2016-04-13 DIAGNOSIS — J988 Other specified respiratory disorders: Secondary | ICD-10-CM

## 2016-04-13 DIAGNOSIS — Z800 Family history of malignant neoplasm of digestive organs: Secondary | ICD-10-CM

## 2016-04-13 DIAGNOSIS — R531 Weakness: Secondary | ICD-10-CM | POA: Diagnosis present

## 2016-04-13 DIAGNOSIS — I350 Nonrheumatic aortic (valve) stenosis: Secondary | ICD-10-CM | POA: Diagnosis present

## 2016-04-13 DIAGNOSIS — Z79899 Other long term (current) drug therapy: Secondary | ICD-10-CM | POA: Diagnosis not present

## 2016-04-13 DIAGNOSIS — D638 Anemia in other chronic diseases classified elsewhere: Secondary | ICD-10-CM | POA: Diagnosis present

## 2016-04-13 DIAGNOSIS — J181 Lobar pneumonia, unspecified organism: Secondary | ICD-10-CM | POA: Diagnosis not present

## 2016-04-13 DIAGNOSIS — J9622 Acute and chronic respiratory failure with hypercapnia: Secondary | ICD-10-CM | POA: Diagnosis present

## 2016-04-13 DIAGNOSIS — R0603 Acute respiratory distress: Secondary | ICD-10-CM | POA: Diagnosis not present

## 2016-04-13 DIAGNOSIS — Z4659 Encounter for fitting and adjustment of other gastrointestinal appliance and device: Secondary | ICD-10-CM

## 2016-04-13 DIAGNOSIS — I2781 Cor pulmonale (chronic): Secondary | ICD-10-CM | POA: Diagnosis present

## 2016-04-13 DIAGNOSIS — R579 Shock, unspecified: Secondary | ICD-10-CM | POA: Diagnosis not present

## 2016-04-13 DIAGNOSIS — R748 Abnormal levels of other serum enzymes: Secondary | ICD-10-CM | POA: Diagnosis not present

## 2016-04-13 DIAGNOSIS — R4182 Altered mental status, unspecified: Secondary | ICD-10-CM

## 2016-04-13 LAB — COMPREHENSIVE METABOLIC PANEL
ALBUMIN: 3.3 g/dL — AB (ref 3.5–5.0)
ALT: 19 U/L (ref 17–63)
AST: 29 U/L (ref 15–41)
Alkaline Phosphatase: 85 U/L (ref 38–126)
Anion gap: 7 (ref 5–15)
BUN: 48 mg/dL — AB (ref 6–20)
CHLORIDE: 97 mmol/L — AB (ref 101–111)
CO2: 41 mmol/L — ABNORMAL HIGH (ref 22–32)
Calcium: 8.8 mg/dL — ABNORMAL LOW (ref 8.9–10.3)
Creatinine, Ser: 1.58 mg/dL — ABNORMAL HIGH (ref 0.61–1.24)
GFR calc Af Amer: 48 mL/min — ABNORMAL LOW (ref >60)
GFR, EST NON AFRICAN AMERICAN: 41 mL/min — AB (ref >60)
GLUCOSE: 145 mg/dL — AB (ref 65–99)
POTASSIUM: 4.7 mmol/L (ref 3.5–5.1)
Sodium: 145 mmol/L (ref 135–145)
Total Bilirubin: 0.8 mg/dL (ref 0.3–1.2)
Total Protein: 7.6 g/dL (ref 6.5–8.1)

## 2016-04-13 LAB — CBC WITH DIFFERENTIAL/PLATELET
BASOS ABS: 0 10*3/uL (ref 0–0.1)
BASOS PCT: 0 %
Eosinophils Absolute: 0 10*3/uL (ref 0–0.7)
Eosinophils Relative: 0 %
HEMATOCRIT: 39.4 % — AB (ref 40.0–52.0)
HEMOGLOBIN: 12.2 g/dL — AB (ref 13.0–18.0)
Lymphocytes Relative: 4 %
Lymphs Abs: 0.3 10*3/uL — ABNORMAL LOW (ref 1.0–3.6)
MCH: 27.7 pg (ref 26.0–34.0)
MCHC: 30.9 g/dL — AB (ref 32.0–36.0)
MCV: 89.6 fL (ref 80.0–100.0)
MONOS PCT: 6 %
Monocytes Absolute: 0.5 10*3/uL (ref 0.2–1.0)
NEUTROS ABS: 7.5 10*3/uL — AB (ref 1.4–6.5)
NEUTROS PCT: 90 %
Platelets: 101 10*3/uL — ABNORMAL LOW (ref 150–440)
RBC: 4.39 MIL/uL — AB (ref 4.40–5.90)
RDW: 16.2 % — ABNORMAL HIGH (ref 11.5–14.5)
WBC: 8.3 10*3/uL (ref 3.8–10.6)

## 2016-04-13 LAB — PROTIME-INR
INR: 1.05
PROTHROMBIN TIME: 13.7 s (ref 11.4–15.2)

## 2016-04-13 LAB — TROPONIN I: Troponin I: 0.05 ng/mL (ref <0.03)

## 2016-04-13 LAB — APTT: aPTT: 29 seconds (ref 24–36)

## 2016-04-13 LAB — LIPASE, BLOOD: LIPASE: 45 U/L (ref 11–51)

## 2016-04-13 LAB — CREATININE, SERUM
CREATININE: 1.45 mg/dL — AB (ref 0.61–1.24)
GFR calc Af Amer: 53 mL/min — ABNORMAL LOW (ref >60)
GFR calc non Af Amer: 46 mL/min — ABNORMAL LOW (ref >60)

## 2016-04-13 LAB — LACTIC ACID, PLASMA: LACTIC ACID, VENOUS: 1.9 mmol/L (ref 0.5–1.9)

## 2016-04-13 MED ORDER — CEFTRIAXONE SODIUM-DEXTROSE 1-3.74 GM-% IV SOLR
1.0000 g | Freq: Once | INTRAVENOUS | Status: AC
  Administered 2016-04-13: 1 g via INTRAVENOUS

## 2016-04-13 MED ORDER — GUAIFENESIN-DM 100-10 MG/5ML PO SYRP: 5.0000 mL | ORAL_SOLUTION | ORAL | Status: DC | PRN

## 2016-04-13 MED ORDER — CEFTRIAXONE SODIUM-DEXTROSE 1-3.74 GM-% IV SOLR
1.0000 g | INTRAVENOUS | Status: DC
  Filled 2016-04-13: qty 50

## 2016-04-13 MED ORDER — DEXTROSE 5 % IV SOLN: 500.0000 mg | INTRAVENOUS | Status: DC

## 2016-04-13 MED ORDER — ASPIRIN EC 81 MG PO TBEC: 81.0000 mg | DELAYED_RELEASE_TABLET | Freq: Every day | ORAL | Status: DC

## 2016-04-13 MED ORDER — SODIUM CHLORIDE 0.9 % IV BOLUS (SEPSIS)
1000.0000 mL | Freq: Once | INTRAVENOUS | Status: AC
  Administered 2016-04-13: 1000 mL via INTRAVENOUS

## 2016-04-13 MED ORDER — DEXTROSE 5 % IV SOLN
500.0000 mg | Freq: Once | INTRAVENOUS | Status: AC
  Administered 2016-04-13: 500 mg via INTRAVENOUS
  Filled 2016-04-13: qty 500

## 2016-04-13 MED ORDER — DEXTROSE 5 % IV SOLN: 1.0000 g | INTRAVENOUS | Status: DC

## 2016-04-13 MED ORDER — DEXTROSE 5 % IV SOLN
1.0000 g | Freq: Once | INTRAVENOUS | Status: DC
  Filled 2016-04-13: qty 10

## 2016-04-13 MED ORDER — HEPARIN SODIUM (PORCINE) 5000 UNIT/ML IJ SOLN
5000.0000 [IU] | Freq: Three times a day (TID) | INTRAMUSCULAR | Status: DC
  Administered 2016-04-13 – 2016-04-15 (×5): 5000 [IU] via SUBCUTANEOUS
  Filled 2016-04-13 (×5): qty 1

## 2016-04-13 MED ORDER — MOMETASONE FURO-FORMOTEROL FUM 200-5 MCG/ACT IN AERO
2.0000 | INHALATION_SPRAY | Freq: Two times a day (BID) | RESPIRATORY_TRACT | Status: DC
  Filled 2016-04-13: qty 8.8

## 2016-04-13 MED ORDER — DEXTROSE 5 % IV SOLN
250.0000 mg | INTRAVENOUS | Status: DC
  Filled 2016-04-13: qty 250

## 2016-04-13 MED ORDER — ORAL CARE MOUTH RINSE: 15.0000 mL | Freq: Two times a day (BID) | OROMUCOSAL | Status: DC

## 2016-04-13 MED ORDER — FINASTERIDE 5 MG PO TABS
5.0000 mg | ORAL_TABLET | Freq: Every day | ORAL | Status: DC
  Administered 2016-04-14 – 2016-04-17 (×4): 5 mg via ORAL
  Filled 2016-04-13 (×4): qty 1

## 2016-04-13 MED ORDER — SODIUM CHLORIDE 0.9 % IV SOLN
INTRAVENOUS | Status: DC
  Administered 2016-04-13: 18:00:00 via INTRAVENOUS

## 2016-04-13 MED ORDER — LEVALBUTEROL HCL 1.25 MG/0.5ML IN NEBU: 1.2500 mg | INHALATION_SOLUTION | Freq: Four times a day (QID) | RESPIRATORY_TRACT | Status: DC | PRN

## 2016-04-13 MED ORDER — ATORVASTATIN CALCIUM 20 MG PO TABS: 40.0000 mg | ORAL_TABLET | Freq: Every day | ORAL | Status: DC

## 2016-04-13 NOTE — H&P (Signed)
[facility] [place] [facility]   PATIENT NAME: Keith Watts    MR#:  [id number]  DATE OF BIRTH:  1940-12-18  DATE OF ADMISSION:  2016-04-16  PRIMARY CARE PHYSICIAN: [name], MD   REQUESTING/REFERRING PHYSICIAN: Sharman Cheek, MD  CHIEF COMPLAINT:   Chief Complaint  Patient presents with  . Weakness   Confusion and weakness for 3-4 days HISTORY OF PRESENT ILLNESS:  Keith Watts  is a 75 y.o. male with a known history of COPD, chronic respiratory failure on home oxygen 3 L, CAD and pneumonia. The patient was sent to the ED from home due to above chief complaint. The patient is confused, unable to answer questions. According to the patient's wife and daughter, the patient has been feeling weak for the past few days. He also has cough, sputum, poor oral intake and generalized weakness. He was noticed to be confused for the past 3-4 days. His home oxygen was increased to 4 L by nasal cannula by Dr. Meredeth Ide due to shortness of breath one week ago. Chest x-ray show bilateral lower lobe pneumonia. He was treated with Zithromax and Rocephin in the ED. CAT scan of the head is unremarkable.  PAST MEDICAL HISTORY:   Past Medical History:  Diagnosis Date  . Allergic rhinitis   . BPH (benign prostatic hyperplasia)   . CAD (coronary artery disease)    a. 1 vessel CAD by cardiac cath in 2010 managed medically; b. cath 11/2014 mLAD 30%, mLCx 99% s/p PCI/DES 0%, mRCA 40%, mod calcified coronaries, nl LVSF, mod elevated LVEDP, mild AS  . Chicken pox   . Chronic respiratory failure (HCC)    a. on 3L O2 via nasal cannula  . COPD (chronic obstructive pulmonary disease) (HCC)   . Diastolic dysfunction    a. echo 09/2014: EF 50-55%, impaired relaxation of LV diastolic filling, nl RV size & sys fxn, mildly dilated LA, mild MR, mild Ao stenosis, nl PASP  . Measles   . Mumps   . Pneumonia    a. admission to [facility] 09/2014 & 11/2014  . Vitamin D deficiency     PAST SURGICAL  HISTORY:   Past Surgical History:  Procedure Laterality Date  . APPENDECTOMY    . CARDIAC CATHETERIZATION N/A 11/28/2014   Procedure: Left Heart Cath and Coronary Angiography;  Surgeon: Iran Ouch, MD;  Location: [facility] INVASIVE CV LAB;  Service: Cardiovascular;  Laterality: N/A;  please schedule for lunch time with Dr. Kirke Corin, MD  . CARDIAC CATHETERIZATION N/A 11/28/2014   Procedure: Coronary Stent Intervention;  Surgeon: Iran Ouch, MD;  Location: [facility] INVASIVE CV LAB;  Service: Cardiovascular;  Laterality: N/A;  . EYE SURGERY    . TONSILLECTOMY      SOCIAL HISTORY:   Social History  Substance Use Topics  . Smoking status: Former Smoker    Packs/day: 2.00    Years: 50.00    Quit date: 06/27/2008  . Smokeless tobacco: Never Used  . Alcohol use No    FAMILY HISTORY:   Family History  Problem Relation Age of Onset  . Cancer - Lung Mother   . Cancer - Colon Father   . Colon cancer Father   . Liver cancer Brother   . Pancreatic cancer Brother   . Prostate cancer Brother   . Liver cancer Brother     DRUG ALLERGIES:  No Known Allergies  REVIEW OF SYSTEMS:   Review of Systems  Unable to perform ROS: Mental status change  MEDICATIONS AT HOME:   Prior to Admission medications   Medication Sig Start Date End Date Taking? Authorizing Provider  aspirin EC 81 MG tablet Take 81 mg by mouth daily.   Yes Historical Provider, MD  atorvastatin (LIPITOR) 40 MG tablet TAKE 1 TABLET (40 MG TOTAL) BY MOUTH DAILY AT 6 PM. 10/12/15  Yes Malva Limes, MD  finasteride (PROSCAR) 5 MG tablet Take 5 mg by mouth at bedtime.   Yes Historical Provider, MD  fluticasone-salmeterol (ADVAIR HFA) 115-21 MCG/ACT inhaler Inhale 2 puffs into the lungs 2 (two) times daily.   Yes Historical Provider, MD  furosemide (LASIX) 20 MG tablet Take 1 tablet (20 mg total) by mouth daily. 01/08/16  Yes Iran Ouch, MD  OXYGEN 3 L continuous.  12/21/06   Historical Provider, MD      VITAL SIGNS:    Blood pressure 129/78, pulse (!) 109, temperature 98.6 F (37 C), resp. rate (!) 27, height 5\' 9"  (1.753 m), weight 138 lb (62.6 kg), SpO2 98 %.  PHYSICAL EXAMINATION:  Physical Exam  GENERAL:  75 y.o.-year-old patient lying in the bed with no acute distress.  EYES: Pupils equal, round, reactive to light and accommodation. No scleral icterus. Extraocular muscles intact.  HEENT: Head atraumatic, normocephalic.  NECK:  Supple, no jugular venous distention. No thyroid enlargement, no tenderness.  LUNGS: Normal breath sounds bilaterally, no wheezing, Mild crackles on the right base. No use of accessory muscles of respiration.  CARDIOVASCULAR: S1, S2 normal. No murmurs, rubs, or gallops.  ABDOMEN: Soft, nontender, nondistended. Bowel sounds present. No organomegaly or mass.  EXTREMITIES: No pedal edema, cyanosis, or clubbing.  NEUROLOGIC: Only opens eyes upon stimuli, Unable to exam.  PSYCHIATRIC: The patient is confused and noncommunicative at this time. SKIN: No obvious rash, lesion, or ulcer.   LABORATORY PANEL:   CBC  Recent Labs Lab 04/16/2016 1332  WBC 8.3  HGB 12.2*  HCT 39.4*  PLT 101*   ------------------------------------------------------------------------------------------------------------------  Chemistries   Recent Labs Lab 04/12/2016 1332  NA 145  K 4.7  CL 97*  CO2 41*  GLUCOSE 145*  BUN 48*  CREATININE 1.58*  CALCIUM 8.8*  AST 29  ALT 19  ALKPHOS 85  BILITOT 0.8   ------------------------------------------------------------------------------------------------------------------  Cardiac Enzymes  Recent Labs Lab 04/12/2016 1332  TROPONINI 0.04*   ------------------------------------------------------------------------------------------------------------------  RADIOLOGY:  Ct Head Wo Contrast  Result Date: 04/11/2016 CLINICAL DATA:  Patient to ER from home via [facility] for c/o generalized weakness and BLE weakness x1 week. Altered mental  status. EXAM: CT HEAD WITHOUT CONTRAST TECHNIQUE: Contiguous axial images were obtained from the base of the skull through the vertex without intravenous contrast. COMPARISON:  03/24/2014 FINDINGS: Brain: No evidence of acute infarction, hemorrhage, hydrocephalus, extra-axial collection or mass lesion/mass effect. Mild generalized cerebral atrophy and moderate chronic small vessel disease show no significant change. Vascular: No hyperdense vessel or unexpected calcification. Skull: Normal. Negative for fracture or focal lesion. Sinuses/Orbits: No acute finding. Other: None. IMPRESSION: No acute intracranial abnormality. 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 Electronically Signed   By: Myles Rosenthal M.D.   On: 04/12/2016 14:03   Dg Chest Port 1 View  Result Date: 04/11/2016 CLINICAL DATA:  Suzette Battiest recently, fell 3 days ago and again today, generalized weakness, increased shortness of breath, increased oxygen requirements, COPD, coronary artery disease, chronic renal failure EXAM: PORTABLE CHEST 1 VIEW COMPARISON:  Portable exam 1402 hours compared to 11/24/2015 FINDINGS: Rotated to the RIGHT. Upper normal  heart size. Atherosclerotic calcification aorta. Mediastinal contours and pulmonary vascularity normal. Interstitial infiltrates at the mid to lower lungs increased since prior study, could represent infection or edema. Mild RIGHT basilar atelectasis. Underlying emphysematous changes. No gross pleural effusion or pneumothorax. Bones demineralized. IMPRESSION: Emphysematous changes with RIGHT basilar atelectasis and mild interstitial infiltrates in mid to lower lungs which could represent mild edema or infection. Aortic atherosclerosis. Electronically Signed   By: Ulyses SouthwardMark  Boles M.D.   On: 04/12/2016 14:22      IMPRESSION AND PLAN:   Pneumonia, CAD The patient will be admitted to medical floor. Continue Zithromax, Rocephin and nebulizer treatment. Follow-up CBC and cultures.  Acute  metabolic encephalopathy, due to her elevated pneumonia and dehydration. Fall and aspiration precautions.  Acute renal failure. Due to poor oral intake and dehydration. Hold Lasix, start normal saline IV and follow-up BMP.  Sinus tachycardia due to above.  Chronic respiratory failure on home oxygen and the COPD. Continue oxygen by nasal cannula, now on 4L. NEB when necessary. Dulera.  Generalized weakness. PT evaluation, possible from tomorrow.  CAD. Continue aspirin.  All the records are reviewed and case discussed with ED provider. Management plans discussed with the patient's wife and daughter and they are in agreement.  CODE STATUS: Full code per his wife.  TOTAL TIME TAKING CARE OF THIS PATIENT: 57 minutes.    Shaune Pollackhen, Keith Watts M.D on 04/09/2016 at 4:32 PM  Between 7am to 6pm - Pager - [phone]  After 6pm go to [email or web address] - Social research officer, governmentpassword [email or web address]  [facility]  Office  [phone]  CC: Primary care physician; Mila Merryonald Fisher, MD   Note: This dictation was prepared with Dragon dictation along with smaller phrase technology. Any transcriptional errors that result from this process are unintentional.

## 2016-04-13 NOTE — ED Provider Notes (Signed)
[facility] Emergency Department Provider Note  ____________________________________________  Time seen: Approximately 1:26 PM  I have reviewed the triage vital signs and the nursing notes.   HISTORY  Chief Complaint Weakness Level 5 caveat:  Portions of the history and physical were unable to be obtained due to the patient's acute illness And altered mental status   HPI Keith Watts is a 75 y.o. male brought to the ED due to generalized weakness and altered mental status for the past 4 days. Patient has not been eating or drinking and has had multiple falls as well. Has a history of COPD, on 3 L nasal cannula chronically. Lately he is required 4 L nasal cannula due to increased shortness of breath.     Past Medical History:  Diagnosis Date  . Allergic rhinitis   . BPH (benign prostatic hyperplasia)   . CAD (coronary artery disease)    a. 1 vessel CAD by cardiac cath in 2010 managed medically; b. cath 11/2014 mLAD 30%, mLCx 99% s/p PCI/DES 0%, mRCA 40%, mod calcified coronaries, nl LVSF, mod elevated LVEDP, mild AS  . Chicken pox   . Chronic respiratory failure (HCC)    a. on 3L O2 via nasal cannula  . COPD (chronic obstructive pulmonary disease) (HCC)   . Diastolic dysfunction    a. echo 09/2014: EF 50-55%, impaired relaxation of LV diastolic filling, nl RV size & sys fxn, mildly dilated LA, mild MR, mild Ao stenosis, nl PASP  . Measles   . Mumps   . Pneumonia    a. admission to [facility] 09/2014 & 11/2014  . Vitamin D deficiency      Patient Active Problem List   Diagnosis Date Noted  . Pneumonia 04/10/2016  . History of hemoptysis 01/08/2015  . Recurrent pneumonia 01/08/2015  . Asthma 12/17/2014  . Arteriosclerosis, aorta 12/17/2014  . Frequent falls 12/17/2014  . Vitamin D deficiency 12/17/2014  . Chronic diastolic heart failure (HCC) 12/15/2014  . Hyperlipidemia 12/15/2014  . NSTEMI (non-ST elevated myocardial infarction) (HCC)   .  Diastolic dysfunction   . Coronary artery disease   . Sepsis due to pneumonia (HCC) 11/26/2014  . ACS (acute coronary syndrome) (HCC) 11/26/2014  . COPD (chronic obstructive pulmonary disease) (HCC) 11/26/2014  . COPD Exacerbation 11/26/2014  . Sepsis (HCC) 10/25/2014  . Benign prostatic hyperplasia with urinary obstruction 04/18/2012  . 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 Hypothyroidism 09/27/2008  . 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 Elevated prostate specific antigen (PSA) 07/16/2006  . History of tobacco use 07/12/2006  . Allergic rhinitis 06/27/2004     Past Surgical History:  Procedure Laterality Date  . APPENDECTOMY    . CARDIAC CATHETERIZATION N/A 11/28/2014   Procedure: Left Heart Cath and Coronary Angiography;  Surgeon: Iran Ouch, MD;  Location: [facility] INVASIVE CV LAB;  Service: Cardiovascular;  Laterality: N/A;  please schedule for lunch time with Dr. Kirke Corin, MD  . CARDIAC CATHETERIZATION N/A 11/28/2014   Procedure: Coronary Stent Intervention;  Surgeon: Iran Ouch, MD;  Location: [facility] INVASIVE CV LAB;  Service: Cardiovascular;  Laterality: N/A;  . EYE SURGERY    . TONSILLECTOMY       Prior to Admission medications   Medication Sig Start Date End Date Taking? Authorizing Provider  aspirin EC 81 MG tablet Take 81 mg by mouth daily.   Yes Historical Provider, MD  atorvastatin (LIPITOR) 40 MG tablet TAKE 1 TABLET (40 MG TOTAL) BY MOUTH DAILY AT 6 PM. 10/12/15  Yes Malva Limes, MD  finasteride (PROSCAR) 5 MG tablet Take 5 mg by mouth at bedtime.   Yes Historical Provider, MD  fluticasone-salmeterol (ADVAIR HFA) 115-21 MCG/ACT inhaler Inhale 2 puffs into the lungs 2 (two) times daily.   Yes Historical Provider, MD  furosemide (LASIX) 20 MG tablet Take 1 tablet (20 mg total) by mouth daily. 01/08/16  Yes Iran Ouch, MD  OXYGEN 3 L continuous.  12/21/06   Historical Provider, MD     Allergies Review of patient's allergies indicates no known  allergies.   Family History  Problem Relation Age of Onset  . Cancer - Lung Mother   . Cancer - Colon Father   . Colon cancer Father   . Liver cancer Brother   . Pancreatic cancer Brother   . Prostate cancer Brother   . Liver cancer Brother     Social History Social History  Substance Use Topics  . Smoking status: Former Smoker    Packs/day: 2.00    Years: 50.00    Quit date: 06/27/2008  . Smokeless tobacco: Never Used  . Alcohol use No    Review of Systems Limited ability to obtain due to altered mental status Constitutional:   No fever or chills.  Cardiovascular:   No chest pain. Respiratory:  Positive shortness of breath and productive cough. Neurological:   Positive confusion 10-point ROS otherwise negative.  ____________________________________________   PHYSICAL EXAM:  VITAL SIGNS: ED Triage Vitals  Enc Vitals Group     BP 04/17/2016 1322 118/66     Pulse Rate 03/31/2016 1322 (!) 116     Resp 04/12/2016 1322 (!) 38     Temp 04/05/2016 1322 98.6 F (37 C)     Temp Source 04/15/2016 1322 Oral     SpO2 04/04/2016 1322 98 %     Weight 03/28/2016 1323 138 lb (62.6 kg)     Height 04/10/2016 1323 5\' 9"  (1.753 m)     Head Circumference --      Peak Flow --      Pain Score --      Pain Loc --      Pain Edu? --      Excl. in GC? --     Vital signs reviewed, nursing assessments reviewed.   Constitutional:   Alert And stuporous. Ill-appearing. Eyes:   No scleral icterus. No conjunctival pallor. PERRL. EOMI.  No nystagmus. ENT   Head:   Normocephalic and atraumatic.   Nose:   No congestion/rhinnorhea. No septal hematoma   Mouth/Throat:   Dry mucous membranes, no pharyngeal erythema. No peritonsillar mass.    Neck:   No stridor. No SubQ emphysema. No meningismus. Hematological/Lymphatic/Immunilogical:   No cervical lymphadenopathy. Cardiovascular:   Tachycardia heart rate 110. Symmetric bilateral radial and DP pulses.  No murmurs.  Respiratory:  Tachypnea,  diffuse crackles. Decreased lung sounds at the bilateral base.. Gastrointestinal:   Soft and nontender. Non distended. There is no CVA tenderness.  No rebound, rigidity, or guarding. Genitourinary:   deferred Musculoskeletal:   Nontender with normal range of motion in all extremities. No joint effusions.  No lower extremity tenderness.  No edema. Neurologic:   Poorly interactive.  CN 2-10 normal. Motor grossly intact. No gross focal neurologic deficits are appreciated.  Skin:    Skin is warm, dry and intact. No rash noted.  No petechiae, purpura, or bullae. Decreased skin turgor  ____________________________________________    LABS (pertinent positives/negatives) (all labs ordered are listed, but only  abnormal results are displayed) Labs Reviewed  COMPREHENSIVE METABOLIC PANEL - Abnormal; Notable for the following:       Result Value   Chloride 97 (*)    CO2 41 (*)    Glucose, Bld 145 (*)    BUN 48 (*)    Creatinine, Ser 1.58 (*)    Calcium 8.8 (*)    Albumin 3.3 (*)    GFR calc non Af Amer 41 (*)    GFR calc Af Amer 48 (*)    All other components within normal limits  TROPONIN I - Abnormal; Notable for the following:    Troponin I 0.04 (*)    All other components within normal limits  CBC WITH DIFFERENTIAL/PLATELET - Abnormal; Notable for the following:    RBC 4.39 (*)    Hemoglobin 12.2 (*)    HCT 39.4 (*)    MCHC 30.9 (*)    RDW 16.2 (*)    Platelets 101 (*)    Neutro Abs 7.5 (*)    Lymphs Abs 0.3 (*)    All other components within normal limits  CULTURE, BLOOD (ROUTINE X 2)  CULTURE, BLOOD (ROUTINE X 2)  CULTURE, EXPECTORATED SPUTUM-ASSESSMENT  URINE CULTURE  LACTIC ACID, PLASMA  LIPASE, BLOOD  APTT  PROTIME-INR  LACTIC ACID, PLASMA  URINALYSIS COMPLETEWITH MICROSCOPIC ([facility] ONLY)   ____________________________________________   EKG  Interpreted by me Sinus tachycardia rate 1:15, left axis, normal intervals. Normal QRS ST segments and T  waves.  ____________________________________________    RADIOLOGY  Chest x-ray reveals diffuse and INTERSTITIAL infiltrates and right lung consistent with atypical pneumonia  ____________________________________________   PROCEDURES Procedures CRITICAL CARE Performed by: Scotty CourtSTAFFORD, Shi Grose   Total critical care time: 35 minutes  Critical care time was exclusive of separately billable procedures and treating other patients.  Critical care was necessary to treat or prevent imminent or life-threatening deterioration.  Critical care was time spent personally by me on the following activities: development of treatment plan with patient and/or surrogate as well as nursing, discussions with consultants, evaluation of patient's response to treatment, examination of patient, obtaining history from patient or surrogate, ordering and performing treatments and interventions, ordering and review of laboratory studies, ordering and review of radiographic studies, pulse oximetry and re-evaluation of patient's condition.  ____________________________________________   INITIAL IMPRESSION / ASSESSMENT AND PLAN / ED COURSE  Pertinent labs & imaging results that were available during my care of the patient were reviewed by me and considered in my medical decision making (see chart for details).  Due to altered mental status hypoxia and tachycardia, could sepsis was initiated on initial assessment. Labs returned essentially unremarkable except for acute renal insufficiency with a creatinine increased from 1-1.6.. He has a slightly elevated troponin of 0.04 of unclear clinical significance. Case discussed with hospitalist for admission and further management of acute on chronic respiratory failure with hypoxia, acute renal insufficiency, coming pneumonia with delirium.     Clinical Course   ____________________________________________   FINAL CLINICAL IMPRESSION(S) / ED DIAGNOSES  Final  diagnoses:  Acute on chronic respiratory failure with hypoxia (HCC)  Community acquired pneumonia of right lung, unspecified part of lung       Portions of this note were generated with dragon dictation software. Dictation errors may occur despite best attempts at proofreading.    Sharman CheekPhillip Imer Foxworth, MD 05/27/2016 [phone]

## 2016-04-13 NOTE — ED Triage Notes (Signed)
[facility] for c/o generalized weakness and BLE weakness x1 week. Patient arrives on 4L O2 via nasal cannula (wears 3L at all times at home). Patient sats ranging approx 88% for EMS (patient states 84-88% is his normal on 3L at home). Patient also reports loss of appetite x1 week.

## 2016-04-13 NOTE — ED Notes (Signed)
Admitting MD at bedside.

## 2016-04-13 NOTE — Progress Notes (Signed)
Pharmacy Antibiotic Note  Keith Watts is a 75 y.o. male admitted on 06-13-16 with pneumonia.  Pharmacy has been consulted for ceftriaxone and azithromycin dosing.  Plan: Ceftriaxone 1 g IV daily Azithromycin 500 mg IV today followed by 250 mg daily.   Monitor for change to PO.  Height: 5\' 6"  (167.6 cm) Weight: 138 lb 12.8 oz (63 kg) IBW/kg (Calculated) : 63.8  Temp (24hrs), Avg:98.3 F (36.8 C), Min:97.7 F (36.5 C), Max:98.6 F (37 C)   Recent Labs Lab 04-Jul-2015 1332  WBC 8.3  CREATININE 1.58*  LATICACIDVEN 1.9    Estimated Creatinine Clearance: 36.6 mL/min (by C-G formula based on SCr of 1.58 mg/dL (H)).    No Known Allergies  Antimicrobials this admission: ceftriaxone 10/18 >>  azithromycin 10/18 >>   Dose adjustments this admission:  Microbiology results: 10/18 BCx: Sent 10/18 UCx: Sent  10/18 Sputum: Sent   Thank you for allowing pharmacy to be a part of this patient's care.  Cindi CarbonMary M Wagner Tanzi, PharmD, BCPS Clinical Pharmacist 06-13-16 5:50 PM

## 2016-04-14 ENCOUNTER — Inpatient Hospital Stay: Payer: Medicare Other

## 2016-04-14 ENCOUNTER — Inpatient Hospital Stay
Admit: 2016-04-14 | Discharge: 2016-04-14 | Disposition: A | Discharge status: Home / Self Care | Payer: Medicare Other | Attending: Critical Care Medicine | Admitting: Critical Care Medicine

## 2016-04-14 DIAGNOSIS — G9341 Metabolic encephalopathy: Secondary | ICD-10-CM

## 2016-04-14 DIAGNOSIS — J9621 Acute and chronic respiratory failure with hypoxia: Secondary | ICD-10-CM

## 2016-04-14 DIAGNOSIS — J189 Pneumonia, unspecified organism: Secondary | ICD-10-CM

## 2016-04-14 LAB — COMPREHENSIVE METABOLIC PANEL
ALT: 17 U/L (ref 17–63)
AST: 33 U/L (ref 15–41)
Albumin: 2.9 g/dL — ABNORMAL LOW (ref 3.5–5.0)
Alkaline Phosphatase: 87 U/L (ref 38–126)
Anion gap: 9 (ref 5–15)
BILIRUBIN TOTAL: 1 mg/dL (ref 0.3–1.2)
BUN: 54 mg/dL — AB (ref 6–20)
CHLORIDE: 107 mmol/L (ref 101–111)
CO2: 30 mmol/L (ref 22–32)
CREATININE: 2.16 mg/dL — AB (ref 0.61–1.24)
Calcium: 7.8 mg/dL — ABNORMAL LOW (ref 8.9–10.3)
GFR, EST AFRICAN AMERICAN: 33 mL/min — AB (ref >60)
GFR, EST NON AFRICAN AMERICAN: 28 mL/min — AB (ref >60)
Glucose, Bld: 133 mg/dL — ABNORMAL HIGH (ref 65–99)
POTASSIUM: 5.2 mmol/L — AB (ref 3.5–5.1)
Sodium: 146 mmol/L — ABNORMAL HIGH (ref 135–145)
TOTAL PROTEIN: 6.5 g/dL (ref 6.5–8.1)

## 2016-04-14 LAB — BLOOD GAS, ARTERIAL
ACID-BASE EXCESS: 4.5 mmol/L — AB (ref 0.0–2.0)
ACID-BASE EXCESS: 12.5 mmol/L — AB (ref 0.0–2.0)
Acid-Base Excess: 10.3 mmol/L — ABNORMAL HIGH (ref 0.0–2.0)
Allens test (pass/fail): POSITIVE — AB
Bicarbonate: 35.7 mmol/L — ABNORMAL HIGH (ref 20.0–28.0)
Bicarbonate: 39.3 mmol/L — ABNORMAL HIGH (ref 20.0–28.0)
Bicarbonate: 41 mmol/L — ABNORMAL HIGH (ref 20.0–28.0)
FIO2: 0.6
FIO2: 0.6
FIO2: 0.6
LHR: 15 {breaths}/min
LHR: 20 {breaths}/min
MECHVT: 450 mL
O2 SAT: 80.6 %
O2 SAT: 83.7 %
O2 SAT: 86.8 %
PATIENT TEMPERATURE: 37
PATIENT TEMPERATURE: 37
PATIENT TEMPERATURE: 37
PCO2 ART: 98 mmHg — AB (ref 32.0–48.0)
PCO2 ART: 78 mmHg — AB (ref 32.0–48.0)
PCO2 ART: 76 mmHg — AB (ref 32.0–48.0)
PEEP/CPAP: 5 cmH2O
PEEP: 5 cmH2O
PEEP: 5 cmH2O
PH ART: 7.17 — AB (ref 7.350–7.450)
PH ART: 7.34 — AB (ref 7.350–7.450)
PO2 ART: 57 mmHg — AB (ref 83.0–108.0)
PO2 ART: 53 mmHg — AB (ref 83.0–108.0)
PO2 ART: 56 mmHg — AB (ref 83.0–108.0)
RATE: 20 resp/min
VT: 450 mL
VT: 450 mL
pH, Arterial: 7.31 — ABNORMAL LOW (ref 7.350–7.450)

## 2016-04-14 LAB — CBC WITH DIFFERENTIAL/PLATELET
Basophils Absolute: 0 10*3/uL (ref 0–0.1)
Basophils Relative: 0 %
EOS ABS: 0 10*3/uL (ref 0–0.7)
EOS PCT: 0 %
HCT: 39.2 % — ABNORMAL LOW (ref 40.0–52.0)
Hemoglobin: 11.4 g/dL — ABNORMAL LOW (ref 13.0–18.0)
LYMPHS ABS: 0.5 10*3/uL — AB (ref 1.0–3.6)
LYMPHS PCT: 4 %
MCH: 27.5 pg (ref 26.0–34.0)
MCHC: 29 g/dL — AB (ref 32.0–36.0)
MCV: 94.7 fL (ref 80.0–100.0)
MONOS PCT: 8 %
Monocytes Absolute: 1 10*3/uL (ref 0.2–1.0)
Neutro Abs: 11.4 10*3/uL — ABNORMAL HIGH (ref 1.4–6.5)
Neutrophils Relative %: 88 %
PLATELETS: 113 10*3/uL — AB (ref 150–440)
RBC: 4.14 MIL/uL — AB (ref 4.40–5.90)
RDW: 16.7 % — ABNORMAL HIGH (ref 11.5–14.5)
WBC: 13 10*3/uL — ABNORMAL HIGH (ref 3.8–10.6)

## 2016-04-14 LAB — URINALYSIS COMPLETE WITH MICROSCOPIC (ARMC ONLY)
BILIRUBIN URINE: NEGATIVE
Glucose, UA: NEGATIVE mg/dL
Hgb urine dipstick: NEGATIVE
KETONES UR: NEGATIVE mg/dL
LEUKOCYTES UA: NEGATIVE
NITRITE: NEGATIVE
PH: 5 (ref 5.0–8.0)
Protein, ur: 30 mg/dL — AB
SPECIFIC GRAVITY, URINE: 1.016 (ref 1.005–1.030)

## 2016-04-14 LAB — POTASSIUM: POTASSIUM: 3.5 mmol/L (ref 3.5–5.1)

## 2016-04-14 LAB — GLUCOSE, CAPILLARY
GLUCOSE-CAPILLARY: 134 mg/dL — AB (ref 65–99)
GLUCOSE-CAPILLARY: 168 mg/dL — AB (ref 65–99)
Glucose-Capillary: 149 mg/dL — ABNORMAL HIGH (ref 65–99)
Glucose-Capillary: 161 mg/dL — ABNORMAL HIGH (ref 65–99)

## 2016-04-14 LAB — PROCALCITONIN: PROCALCITONIN: 0.16 ng/mL

## 2016-04-14 LAB — INFLUENZA PANEL BY PCR (TYPE A & B)
H1N1 flu by pcr: NOT DETECTED
INFLAPCR: NEGATIVE
Influenza B By PCR: NEGATIVE

## 2016-04-14 LAB — MAGNESIUM: MAGNESIUM: 2.3 mg/dL (ref 1.7–2.4)

## 2016-04-14 LAB — BASIC METABOLIC PANEL
Anion gap: 6 (ref 5–15)
BUN: 55 mg/dL — AB (ref 6–20)
CHLORIDE: 104 mmol/L (ref 101–111)
CO2: 37 mmol/L — AB (ref 22–32)
Calcium: 8 mg/dL — ABNORMAL LOW (ref 8.9–10.3)
Creatinine, Ser: 1.9 mg/dL — ABNORMAL HIGH (ref 0.61–1.24)
GFR calc non Af Amer: 33 mL/min — ABNORMAL LOW (ref >60)
GFR, EST AFRICAN AMERICAN: 38 mL/min — AB (ref >60)
Glucose, Bld: 101 mg/dL — ABNORMAL HIGH (ref 65–99)
POTASSIUM: 6.1 mmol/L — AB (ref 3.5–5.1)
SODIUM: 147 mmol/L — AB (ref 135–145)

## 2016-04-14 LAB — STREP PNEUMONIAE URINARY ANTIGEN: Strep Pneumo Urinary Antigen: NEGATIVE

## 2016-04-14 LAB — OSMOLALITY, URINE: Osmolality, Ur: 523 mOsm/kg (ref 300–900)

## 2016-04-14 LAB — BRAIN NATRIURETIC PEPTIDE: B Natriuretic Peptide: 1618 pg/mL — ABNORMAL HIGH (ref 0.0–100.0)

## 2016-04-14 LAB — LACTIC ACID, PLASMA
LACTIC ACID, VENOUS: 2.3 mmol/L — AB (ref 0.5–1.9)
LACTIC ACID, VENOUS: 1.1 mmol/L (ref 0.5–1.9)

## 2016-04-14 LAB — CK: CK TOTAL: 226 U/L (ref 49–397)

## 2016-04-14 LAB — ECHOCARDIOGRAM COMPLETE
Height: 66 in
Weight: 2220.8 oz

## 2016-04-14 LAB — TROPONIN I
TROPONIN I: 0.11 ng/mL — AB (ref <0.03)
Troponin I: 0.04 ng/mL (ref <0.03)
Troponin I: 0.05 ng/mL (ref <0.03)

## 2016-04-14 LAB — PHOSPHORUS: Phosphorus: 5.8 mg/dL — ABNORMAL HIGH (ref 2.5–4.6)

## 2016-04-14 LAB — MRSA PCR SCREENING: MRSA by PCR: NEGATIVE

## 2016-04-14 LAB — CREATININE, URINE, RANDOM: Creatinine, Urine: 159 mg/dL

## 2016-04-14 MED ORDER — FENTANYL CITRATE (PF) 100 MCG/2ML IJ SOLN
50.0000 ug | INTRAMUSCULAR | Status: DC | PRN
  Administered 2016-04-14: 50 ug via INTRAVENOUS
  Filled 2016-04-14 (×3): qty 2

## 2016-04-14 MED ORDER — SODIUM CHLORIDE 0.9 % IV SOLN
INTRAVENOUS | Status: DC
  Administered 2016-04-14: 10:00:00 via INTRAVENOUS

## 2016-04-14 MED ORDER — SODIUM POLYSTYRENE SULFONATE 15 GM/60ML PO SUSP
30.0000 g | Freq: Once | ORAL | Status: AC
  Administered 2016-04-14: 30 g via ORAL
  Filled 2016-04-14: qty 120

## 2016-04-14 MED ORDER — FENTANYL CITRATE (PF) 100 MCG/2ML IJ SOLN
50.0000 ug | INTRAMUSCULAR | Status: DC | PRN
  Administered 2016-04-14: 50 ug via INTRAVENOUS
  Filled 2016-04-14: qty 2

## 2016-04-14 MED ORDER — FAMOTIDINE 20 MG PO TABS
20.0000 mg | ORAL_TABLET | Freq: Every day | ORAL | Status: DC
  Administered 2016-04-14 – 2016-04-17 (×4): 20 mg via ORAL
  Filled 2016-04-14 (×4): qty 1

## 2016-04-14 MED ORDER — MIDAZOLAM HCL 2 MG/2ML IJ SOLN
1.0000 mg | INTRAMUSCULAR | Status: DC | PRN
  Administered 2016-04-14 (×2): 1 mg via INTRAVENOUS
  Filled 2016-04-14 (×2): qty 2

## 2016-04-14 MED ORDER — SODIUM CHLORIDE 0.9 % IV BOLUS (SEPSIS)
1000.0000 mL | INTRAVENOUS | Status: AC
  Administered 2016-04-14: 1000 mL via INTRAVENOUS

## 2016-04-14 MED ORDER — VITAL AF 1.2 CAL PO LIQD
1000.0000 mL | ORAL | Status: DC
  Administered 2016-04-14: 1000 mL

## 2016-04-14 MED ORDER — SODIUM CHLORIDE 0.9 % IV BOLUS (SEPSIS)
1000.0000 mL | Freq: Once | INTRAVENOUS | Status: AC
  Administered 2016-04-14: 1000 mL via INTRAVENOUS

## 2016-04-14 MED ORDER — DEXTROSE 5 % IV SOLN
0.0000 ug/min | INTRAVENOUS | Status: DC
  Administered 2016-04-14: 18 ug/min via INTRAVENOUS
  Administered 2016-04-15: 14 ug/min via INTRAVENOUS
  Administered 2016-04-15: 13 ug/min via INTRAVENOUS
  Administered 2016-04-15: 12 ug/min via INTRAVENOUS
  Administered 2016-04-16 (×2): 20 ug/min via INTRAVENOUS
  Administered 2016-04-16: 13 ug/min via INTRAVENOUS
  Administered 2016-04-17: 21.013 ug/min via INTRAVENOUS
  Administered 2016-04-18: 28 ug/min via INTRAVENOUS
  Administered 2016-04-18: 25 ug/min via INTRAVENOUS
  Administered 2016-04-18: 30 ug/min via INTRAVENOUS
  Administered 2016-04-19: 18 ug/min via INTRAVENOUS
  Administered 2016-04-19: 24 ug/min via INTRAVENOUS
  Administered 2016-04-20: 17 ug/min via INTRAVENOUS
  Filled 2016-04-14 (×12): qty 16

## 2016-04-14 MED ORDER — MIDAZOLAM HCL 2 MG/2ML IJ SOLN
2.0000 mg | Freq: Once | INTRAMUSCULAR | Status: AC
  Administered 2016-04-14: 2 mg via INTRAVENOUS

## 2016-04-14 MED ORDER — PIPERACILLIN-TAZOBACTAM 3.375 G IVPB
3.3750 g | Freq: Three times a day (TID) | INTRAVENOUS | Status: DC
  Administered 2016-04-14 – 2016-04-20 (×19): 3.375 g via INTRAVENOUS
  Filled 2016-04-14 (×19): qty 50

## 2016-04-14 MED ORDER — VANCOMYCIN HCL IN DEXTROSE 750-5 MG/150ML-% IV SOLN
750.0000 mg | INTRAVENOUS | Status: DC
  Filled 2016-04-14: qty 150

## 2016-04-14 MED ORDER — VECURONIUM BROMIDE 10 MG IV SOLR
10.0000 mg | Freq: Once | INTRAVENOUS | Status: AC
  Administered 2016-04-14: 10 mg via INTRAVENOUS

## 2016-04-14 MED ORDER — BUDESONIDE 0.5 MG/2ML IN SUSP
0.5000 mg | Freq: Two times a day (BID) | RESPIRATORY_TRACT | Status: DC
  Administered 2016-04-14 – 2016-04-16 (×5): 0.5 mg via RESPIRATORY_TRACT
  Filled 2016-04-14 (×5): qty 2

## 2016-04-14 MED ORDER — CHLORHEXIDINE GLUCONATE 0.12% ORAL RINSE (MEDLINE KIT)
15.0000 mL | Freq: Two times a day (BID) | OROMUCOSAL | Status: DC
  Administered 2016-04-14 – 2016-04-20 (×13): 15 mL via OROMUCOSAL

## 2016-04-14 MED ORDER — FAMOTIDINE IN NACL 20-0.9 MG/50ML-% IV SOLN: 20.0000 mg | INTRAVENOUS | Status: DC

## 2016-04-14 MED ORDER — ASPIRIN 81 MG PO CHEW
81.0000 mg | CHEWABLE_TABLET | Freq: Every day | ORAL | Status: DC
  Administered 2016-04-14 – 2016-04-17 (×4): 81 mg via ORAL
  Filled 2016-04-14 (×4): qty 1

## 2016-04-14 MED ORDER — FENTANYL 2500MCG IN NS 250ML (10MCG/ML) PREMIX INFUSION
10.0000 ug/h | INTRAVENOUS | Status: DC
  Administered 2016-04-14: 25 ug/h via INTRAVENOUS
  Administered 2016-04-15: 10 ug/h via INTRAVENOUS
  Filled 2016-04-14 (×2): qty 250

## 2016-04-14 MED ORDER — FENTANYL CITRATE (PF) 100 MCG/2ML IJ SOLN
100.0000 ug | Freq: Once | INTRAMUSCULAR | Status: AC
  Administered 2016-04-14: 100 ug via INTRAVENOUS

## 2016-04-14 MED ORDER — FUROSEMIDE 10 MG/ML IJ SOLN
100.0000 mg | Freq: Once | INTRAVENOUS | Status: AC
  Administered 2016-04-14: 100 mg via INTRAVENOUS
  Administered 2016-04-14: 09:00:00 via INTRAVENOUS
  Filled 2016-04-14: qty 10

## 2016-04-14 MED ORDER — CALCIUM GLUCONATE 10 % IV SOLN
1.0000 g | Freq: Once | INTRAVENOUS | Status: AC
  Administered 2016-04-14: 1 g via INTRAVENOUS
  Filled 2016-04-14: qty 10

## 2016-04-14 MED ORDER — DEXTROSE 50 % IV SOLN
1.0000 | Freq: Once | INTRAVENOUS | Status: AC
  Administered 2016-04-14: 50 mL via INTRAVENOUS
  Filled 2016-04-14: qty 50

## 2016-04-14 MED ORDER — SODIUM POLYSTYRENE SULFONATE 15 GM/60ML PO SUSP
15.0000 g | Freq: Once | ORAL | Status: AC
  Administered 2016-04-14: 15 g via RECTAL
  Filled 2016-04-14 (×2): qty 60

## 2016-04-14 MED ORDER — ORAL CARE MOUTH RINSE
15.0000 mL | OROMUCOSAL | Status: DC
  Administered 2016-04-14 – 2016-04-20 (×61): 15 mL via OROMUCOSAL

## 2016-04-14 MED ORDER — SODIUM CHLORIDE 0.9 % IV BOLUS (SEPSIS)
500.0000 mL | Freq: Once | INTRAVENOUS | Status: AC
  Administered 2016-04-14: 500 mL via INTRAVENOUS

## 2016-04-14 MED ORDER — SODIUM CHLORIDE 0.9% FLUSH
10.0000 mL | Freq: Two times a day (BID) | INTRAVENOUS | Status: DC
  Administered 2016-04-14 – 2016-04-20 (×12): 10 mL

## 2016-04-14 MED ORDER — VANCOMYCIN HCL IN DEXTROSE 1-5 GM/200ML-% IV SOLN
1000.0000 mg | Freq: Once | INTRAVENOUS | Status: AC
  Administered 2016-04-14: 1000 mg via INTRAVENOUS
  Filled 2016-04-14: qty 200

## 2016-04-14 MED ORDER — IPRATROPIUM-ALBUTEROL 0.5-2.5 (3) MG/3ML IN SOLN
3.0000 mL | Freq: Four times a day (QID) | RESPIRATORY_TRACT | Status: DC
  Administered 2016-04-14 – 2016-04-20 (×25): 3 mL via RESPIRATORY_TRACT
  Filled 2016-04-14 (×25): qty 3

## 2016-04-14 MED ORDER — VITAL HIGH PROTEIN PO LIQD: 1000.0000 mL | ORAL | Status: DC

## 2016-04-14 MED ORDER — SODIUM CHLORIDE 0.9 % IV SOLN
INTRAVENOUS | Status: DC
Start: 2016-04-14 — End: 2016-04-14
  Administered 2016-04-14: 06:00:00 via INTRAVENOUS

## 2016-04-14 MED ORDER — SODIUM CHLORIDE 0.9% FLUSH: 10.0000 mL | INTRAVENOUS | Status: DC | PRN

## 2016-04-14 MED ORDER — INSULIN ASPART 100 UNIT/ML IV SOLN
10.0000 [IU] | Freq: Once | INTRAVENOUS | Status: AC
  Administered 2016-04-14: 10 [IU] via INTRAVENOUS
  Filled 2016-04-14: qty 0.1

## 2016-04-14 MED ORDER — MIDAZOLAM HCL 2 MG/2ML IJ SOLN
1.0000 mg | INTRAMUSCULAR | Status: DC | PRN
  Administered 2016-04-14 – 2016-04-15 (×2): 1 mg via INTRAVENOUS
  Filled 2016-04-14 (×2): qty 2

## 2016-04-14 MED ORDER — DEXTROSE 5 % IV SOLN
0.0000 ug/min | INTRAVENOUS | Status: DC
  Administered 2016-04-14: 10 ug/min via INTRAVENOUS
  Filled 2016-04-14: qty 4

## 2016-04-14 NOTE — Progress Notes (Signed)
Notified patient spouse of patient change in status, pt wife already aware and on the way to ICU.

## 2016-04-14 NOTE — H&P (Signed)
PULMONARY / CRITICAL CARE MEDICINE   Name: Keith Watts MRN: [id number] DOB: [birth year]    ADMISSION DATE:  04/18/2016 CONSULTATION DATE:  04/14/2016  REFERRING MD:  Dr. Elisabeth Pigeon  CHIEF COMPLAINT:  Weakness  HISTORY OF PRESENT ILLNESS:   This is a 75 yo male with a PMH of Pneumonia, Diastolic dysfunction (Echo 09/2014 EF 50-55%), COPD, Former smoker (1 to 1.5 PPD for 40 yrs.), Chronic O2 3L via nasal cannula, CAD, PCI 2016, BPH, and Allergic Rhinitis.  He presented to [facility] ER on 10/18 and admitted to the [facility] unit.  Per ER notes the pts daughter and wife stated the pt developed a productive cough, poor oral intake, multiple falls and generalized weakness for about 3-4 days prompting his visit to the ER.  According the pts family his home O2 was increased from 3L to 4L per pulmonologist Dr. Meredeth Ide 1 week ago due to pt c/o shortness of breath. According to the family the pt has been diagnosed with pneumonia multiple times within the past year. The pt has had choking episodes with water and food intake as well.  On 10/19 a rapid response was initiated upon rapid response nurses arrival it was noted the pt was not responsive to sternal rub and was hypotensive, therefore pt was transferred to ICU and intubated.  PCCM consulted for acute on chronic hypoxic hypercapnic respiratory failure secondary to bilateral lower lobe pneumonia and AECOPD, pulmonary edema requiring mechanical intubation and septic shock requiring pressors.  PAST MEDICAL HISTORY :  He  has a past medical history of Allergic rhinitis; BPH (benign prostatic hyperplasia); CAD (coronary artery disease); Chicken pox; Chronic respiratory failure (HCC); COPD (chronic obstructive pulmonary disease) (HCC); Diastolic dysfunction; Measles; Mumps; Pneumonia; and Vitamin D deficiency.  PAST SURGICAL HISTORY: He  has a past surgical history that includes Eye surgery; Appendectomy; Tonsillectomy; Cardiac catheterization (N/A, 11/28/2014); and  Cardiac catheterization (N/A, 11/28/2014).  No Known Allergies  No current facility-administered medications on file prior to encounter.    Current Outpatient Prescriptions on File Prior to Encounter  Medication Sig  . aspirin EC 81 MG tablet Take 81 mg by mouth daily.  Marland Kitchen atorvastatin (LIPITOR) 40 MG tablet TAKE 1 TABLET (40 MG TOTAL) BY MOUTH DAILY AT 6 PM.  . finasteride (PROSCAR) 5 MG tablet Take 5 mg by mouth at bedtime.  . furosemide (LASIX) 20 MG tablet Take 1 tablet (20 mg total) by mouth daily.  . OXYGEN 3 L continuous.     FAMILY HISTORY:  His indicated that his mother is deceased. He indicated that his father is deceased. He indicated that his sister is deceased. He indicated that two of his three brothers are alive. He indicated that his daughter is alive. He indicated that only one of his two sons is alive.    SOCIAL HISTORY: He  reports that he quit smoking about 7 years ago. He has a 100.00 pack-year smoking history. He has never used smokeless tobacco. He reports that he does not drink alcohol or use drugs.  REVIEW OF SYSTEMS:   Unable to assess pt intubated  SUBJECTIVE:  Unable to assess pt intubated  VITAL SIGNS: BP (!) 85/53 Comment: bolus started, levo already at 25 mcg/kg/min  Pulse (!) 106   Temp (!) 96.8 F (36 C) (Axillary)   Resp 15   Ht 5\' 6"  (1.676 m)   Wt 138 lb 12.8 oz (63 kg)   SpO2 92%   BMI 22.40 kg/m   HEMODYNAMICS:    VENTILATOR  SETTINGS:    INTAKE / OUTPUT: I/O last 3 completed shifts: In: 746 [P.O.:60; I.V.:686] Out: 0   PHYSICAL EXAMINATION: General:  Chronically ill appearing Caucasian male Neuro:  Sedated on ventilator, PERRL, does not follow commands HEENT:  Supple, no JVD Cardiovascular:  Sinus tachycardia, s1s2, no M/R/G Lungs:  Diminished throughout, even, non labored Abdomen:  Hypoactive BS x4, soft, non tender, non distended Musculoskeletal:  Normal bulk and tone Skin:  Intact no rashes or  lesions  LABS:  BMET  Recent Labs Lab 03/28/2016 1332 04/06/2016 1827 04/14/16 0504  NA 145  --  147*  K 4.7  --  6.1*  CL 97*  --  104  CO2 41*  --  37*  BUN 48*  --  55*  CREATININE 1.58* 1.45* 1.90*  GLUCOSE 145*  --  101*    Electrolytes  Recent Labs Lab 03/27/2016 1332 04/14/16 0504  CALCIUM 8.8* 8.0*    CBC  Recent Labs Lab 04/01/2016 1332  WBC 8.3  HGB 12.2*  HCT 39.4*  PLT 101*    Coag's  Recent Labs Lab 03/29/2016 1332  APTT 29  INR 1.05    Sepsis Markers  Recent Labs Lab 04/18/2016 1332  LATICACIDVEN 1.9    ABG No results for input(s): PHART, PCO2ART, PO2ART in the last 168 hours.  Liver Enzymes  Recent Labs Lab 04/17/2016 1332  AST 29  ALT 19  ALKPHOS 85  BILITOT 0.8  ALBUMIN 3.3*    Cardiac Enzymes  Recent Labs Lab 04/14/2016 1332 04/24/2016 1827  TROPONINI 0.04* 0.05*    Glucose  Recent Labs Lab 04/14/16 0819  GLUCAP 134*    Imaging Ct Head Wo Contrast  Result Date: 04/07/2016 CLINICAL DATA:  Patient to ER from home via [facility] for c/o generalized weakness and BLE weakness x1 week. Altered mental status. EXAM: CT HEAD WITHOUT CONTRAST TECHNIQUE: Contiguous axial images were obtained from the base of the skull through the vertex without intravenous contrast. COMPARISON:  03/24/2014 FINDINGS: Brain: No evidence of acute infarction, hemorrhage, hydrocephalus, extra-axial collection or mass lesion/mass effect. Mild generalized cerebral atrophy and moderate chronic small vessel disease show no significant change. Vascular: No hyperdense vessel or unexpected calcification. Skull: Normal. Negative for fracture or focal lesion. Sinuses/Orbits: No acute finding. Other: None. IMPRESSION: No acute intracranial abnormality. 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 Electronically Signed   By: Myles Rosenthal M.D.   On: 04/01/2016 14:03   Dg Chest Port 1 View  Result Date: 04/16/2016 CLINICAL DATA:  Suzette Battiest recently,  fell 3 days ago and again today, generalized weakness, increased shortness of breath, increased oxygen requirements, COPD, coronary artery disease, chronic renal failure EXAM: PORTABLE CHEST 1 VIEW COMPARISON:  Portable exam 1402 hours compared to 11/24/2015 FINDINGS: Rotated to the RIGHT. Upper normal heart size. Atherosclerotic calcification aorta. Mediastinal contours and pulmonary vascularity normal. Interstitial infiltrates at the mid to lower lungs increased since prior study, could represent infection or edema. Mild RIGHT basilar atelectasis. Underlying emphysematous changes. No gross pleural effusion or pneumothorax. Bones demineralized. IMPRESSION: Emphysematous changes with RIGHT basilar atelectasis and mild interstitial infiltrates in mid to lower lungs which could represent mild edema or infection. Aortic atherosclerosis. Electronically Signed   By: Ulyses Southward M.D.   On: 04/01/2016 14:22     STUDIES:  CT of head 10/18>>No acute intracranial abnormality.  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  CULTURES: Blood x2 10/18>>negative>> Sputum 10/19>> Urine>>  ANTIBIOTICS: Azithromycin 10/18>>x1 dose Ceftriaxone 10/18>>x1  dose Zosyn 10/19>> Vancomycin 10/19>>  SIGNIFICANT EVENTS: 10/18-Pt admitted to [facility] medsurg unit 10/19-Rapid response initiated due to pt developing acute on chronic hypoxic hypercapnic respiratory failure secondary to bilateral lobe pneumonia and AECOPD requiring mechanical intubation and septic shock requiring pressors  LINES/TUBES: PIV's x3>> Right IJ 10/19>> ETT 10/19>>  ASSESSMENT / PLAN:  PULMONARY A: Acute on chronic hypoxic hypercapnic respiratory failure secondary to bilateral lobe pneumonia and AECOPD, pulmonary edema  Mechanical Ventilation Hx: Allergic rhinitis and Home O2 at 3L P:   Full vent support Continue Bronchodilators Repeat ABG today 10/19 CXR in am  Maintain O2 sats 88% to 92%  CARDIOVASCULAR A:  Hypotension  secondary to septic shock Hx: CAD, PCI (2016 due to mLAD 30% and mLCX 99% occlusion) and diastolic dysfunction) P:  Prn levophed gtt to maintain map >65 Hold outpatient lasix for now due to hypotension Continue proscar and atorvastatin  Echo pending  Telemetry monitoring  CVP monitoring  Trend troponin's  RENAL A:   Acute on chronic renal failure Hyperkalemia  Mild Hypernatremia  P:   Repeat BMP today 10/19 Replace electrolytes as indicated Monitor UOP Foley in place Administer Kayexalate for hyperkalemia CVP monitoring  GASTROINTESTINAL A:   No acute issues P:   Pepcid for PUD prophylaxis Tube feeds  HEMATOLOGIC A:   Anemia  P:  Heparin for VTE prophylaxis Trend CBC's Monitor for s/sx of bleeding Transfuse for hgb <7  INFECTIOUS A:   Bilateral lobe pneumonia P:   Trend WBC and monitor fever curve Trend PCT's and lactic acid Continue abx as listed above Follow cultures  ENDOCRINE A:   No acute issues  P:   CBG's q4hrs Hyper/Hypogylcemia protocol  NEUROLOGIC A:   Acute encephalopathy likely secondary to infectious process and respiratory failure P:   RASS goal: 0 to -1 Prn fentanyl and versed to maintain RASS goal WUA daily Lights on during the day    FAMILY  - Updates: Pts family updated about plan of care and questions answered 04/14/2016  - Inter-disciplinary family meet or Palliative Care meeting due by:  04/21/2016  STAFF NOTE: I, Dr. Stephanie AcreVishal Cinsere Mizrahi have personally reviewed patient's available data, including medical history, events of note, physical examination and test results as part of my evaluation. I have discussed with NP Blakeney and other care providers such as pharmacist, RN and RRT.  In addition,  I personally evaluated patient and elicited key findings of   HPI:  75 year old male past medical history of recurrent left upper lobe pneumonia, diastolic dysfunction, COPD, on chronic O2 3 L, coronary artery disease, allergic rhinitis,  transferred to the ICU after rapid response for unresponsiveness.  Upon arrival to the ICU he was noted to be lethargic and not responding to sternal rub, he was emergently intubated and stabilized. Further history shows that he was admitted to the hospitalist service after episodes of confusion followed by to accidental falls and outpatient status, overnight noted to have increasing lethargy and eventually became unresponsive this morning. Patient did not have any loss of pulse. ABG shows hypercapnic respiratory failure. She also shows that he's been having poor by mouth intake as an outpatient, saw his primary pulmonologist Dr. Mayo AoFlemming one week ago, noted to have cough with sputum production and generalized weakness, wife has increased his oxygen to 4 L nasal cannula with permission from primary pulmonologist about a week ago. Admitting chest x-ray showed bilateral lower lobe pneumonia, and he was started on Zithromax and Rocephin initially. CT scan of the head  does not show any acute bleed.   O:  GEN-acute respiratory distress, unresponsive  HEENT-- no lesions CVS-s1, s2, RRR LUNGS-dec BS, now on MV, no wheezes ABD-soft, nt, nd, +BS    Recent Labs CBC Latest Ref Rng & Units 04/14/2016 03/29/2016 11/10/2015  WBC 3.8 - 10.6 K/uL 13.0(H) 8.3 7.3  Hemoglobin 13.0 - 18.0 g/dL 11.4(L) 12.2(L) 11.8(L)  Hematocrit 40.0 - 52.0 % 39.2(L) 39.4(L) 36.9(L)  Platelets 150 - 440 K/uL 113(L) 101(L) 144(L)      Recent Labs BMP Latest Ref Rng & Units 04/14/2016 04/14/2016 04/21/2016  Glucose 65 - 99 mg/dL 161(W) 960(A) -  BUN 6 - 20 mg/dL 54(U) 98(J) -  Creatinine 0.61 - 1.24 mg/dL 1.91(Y) 7.82(N) 5.62(Z)  BUN/Creat Ratio 10 - 22 - - -  Sodium 135 - 145 mmol/L 146(H) 147(H) -  Potassium 3.5 - 5.1 mmol/L 5.2(H) 6.1(H) -  Chloride 101 - 111 mmol/L 107 104 -  CO2 22 - 32 mmol/L 30 37(H) -  Calcium 8.9 - 10.3 mg/dL 7.8(L) 8.0(L) -       (The following images and results were reviewed by Dr.  Dema Severin on 04/14/2016). Ct Head Wo Contrast  Result Date: 04/08/2016 CLINICAL DATA:  Patient to ER from home via [facility] for c/o generalized weakness and BLE weakness x1 week. Altered mental status. EXAM: CT HEAD WITHOUT CONTRAST TECHNIQUE: Contiguous axial images were obtained from the base of the skull through the vertex without intravenous contrast. COMPARISON:  03/24/2014 FINDINGS: Brain: No evidence of acute infarction, hemorrhage, hydrocephalus, extra-axial collection or mass lesion/mass effect. Mild generalized cerebral atrophy and moderate chronic small vessel disease show no significant change. Vascular: No hyperdense vessel or unexpected calcification. Skull: Normal. Negative for fracture or focal lesion. Sinuses/Orbits: No acute finding. Other: None. IMPRESSION: No acute intracranial abnormality. 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 Electronically Signed   By: Myles Rosenthal M.D.   On: 04/03/2016 14:03   Dg Chest Port 1 View  Result Date: 04/14/2016 CLINICAL DATA:  Central line placement, intubated EXAM: PORTABLE CHEST 1 VIEW COMPARISON:  04/01/2016 FINDINGS: Cardiomediastinal silhouette is stable. Endotracheal tube in place with tip 5.7 cm above the carina. Right IJ central line with tip in distal SVC. No pneumothorax. Worsening infiltrate in left mid lung and left base. Aspiration pneumonia cannot be excluded. There is NG tube coiled within stomach with tip in proximal stomach. No pneumothorax. IMPRESSION: Endotracheal tube in place with tip 5.7 cm above the carina. Right IJ central line with tip in distal SVC. No pneumothorax. Worsening infiltrate in left mid lung and left base. Aspiration pneumonia cannot be excluded. There is NG tube coiled within stomach with tip in proximal stomach. No pneumothorax. Electronically Signed   By: Natasha Mead M.D.   On: 04/14/2016 09:55   Dg Chest Port 1 View  Result Date: 04/12/2016 CLINICAL DATA:  Suzette Battiest recently, fell 3 days  ago and again today, generalized weakness, increased shortness of breath, increased oxygen requirements, COPD, coronary artery disease, chronic renal failure EXAM: PORTABLE CHEST 1 VIEW COMPARISON:  Portable exam 1402 hours compared to 11/24/2015 FINDINGS: Rotated to the RIGHT. Upper normal heart size. Atherosclerotic calcification aorta. Mediastinal contours and pulmonary vascularity normal. Interstitial infiltrates at the mid to lower lungs increased since prior study, could represent infection or edema. Mild RIGHT basilar atelectasis. Underlying emphysematous changes. No gross pleural effusion or pneumothorax. Bones demineralized. IMPRESSION: Emphysematous changes with RIGHT basilar atelectasis and mild interstitial infiltrates in mid to lower lungs which  could represent mild edema or infection. Aortic atherosclerosis. Electronically Signed   By: Ulyses Southward M.D.   On: 04/08/2016 14:22   Dg Abd Portable 1v  Result Date: 04/14/2016 CLINICAL DATA:  OG tube placement. EXAM: PORTABLE ABDOMEN - 1 VIEW COMPARISON:  CT 02/04/2011. FINDINGS: A NG tube noted coiled stomach. Air-filled loops of small and large bowel noted with mild distention. Adynamic ileus cannot be excluded. Follow-up abdominal series to demonstrate resolution suggested. Aortoiliac atherosclerotic vascular calcification. Degenerative changes lumbar spine . IMPRESSION: 1. OG tube noted coiled stomach. Air-filled loops mildly distended small and large bowel noted. Mild adynamic ileus cannot be excluded. 2. Aortoiliac atherosclerotic vascular disease . Electronically Signed   By: Maisie Fus  Register   On: 04/14/2016 09:35      A: 75 year old male with history of recurrent left-sided pneumonia initially admitted by hospitalist service for hypoxic respiratory distress and bilateral lower lobe pneumonia, developed further lethargy and hypercapnia gustatory failure requiring intubation and transfer to the ICU.  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  Rest per NP/medical resident whose note is outlined above and that I agree with  The patient is critically ill with multiple organ systems failure and requires high complexity decision making for assessment and support, frequent evaluation and titration of therapies, application of advanced monitoring technologies and extensive interpretation of multiple databases.   Critical Care Time devoted to patient care services described in this note is  75 Minutes.   This time reflects time of care of this signee Dr Stephanie Acre.  This critical care time does not reflect procedure time, or teaching time or supervisory time of PA/NP/Med-student/Med Resident etc but could involve care discussion time.  Stephanie Acre, MD [facility] Pager [phone] (please enter 7-digits) On Call Pager - [phone] (please enter 7-digits)  Note: This note was prepared with Dragon dictation along with smaller phrase technology. Any transcriptional errors that result from this process are unintentional.

## 2016-04-14 NOTE — Progress Notes (Signed)
[employer] [facility] [facility] [place] [facility]   PATIENT NAME: Keith Watts    MR#:  [id number]  DATE OF BIRTH:  1941-05-30  SUBJECTIVE:  CHIEF COMPLAINT:   Chief Complaint  Patient presents with  . Weakness   Seen early in the morning with rapid response due to hypoxia and hypotension with minimal responsiveness. Transfer to ICU immediately after examining him with nonrebreather mask. Patient was not in the state of giving me any history, I discussed his findings and plan with his wife and daughter on phone. And also with the critical care specialist.  REVIEW OF SYSTEMS:   Drowsy and barely responsive. ROS  DRUG ALLERGIES:  No Known Allergies  VITALS:  Blood pressure (!) 103/52, pulse 87, temperature 99.5 F (37.5 C), temperature source Oral, resp. rate 20, height 5\' 6"  (1.676 m), weight 63 kg (138 lb 12.8 oz), SpO2 91 %.  PHYSICAL EXAMINATION:  GENERAL:  75 y.o.-year-old patient lying in the bed with  acute distress, critical appearing.  EYES: Pupils equal, round, reactive to light . No scleral icterus. Extraocular muscles intact.  HEENT: Head atraumatic, normocephalic. Oropharynx and nasopharynx clear.  NECK:  Supple, no jugular venous distention. No thyroid enlargement, no tenderness.  LUNGS: Normal breath sounds bilaterally, very decreased air entry, diffuse wheezing and crepitations. Fast rate and shallow breathing- with accessory muscles use. CARDIOVASCULAR: S1, S2 normal. No murmurs, rubs, or gallops.  ABDOMEN: Soft, nontender, nondistended. Bowel sounds present. No organomegaly or mass.  EXTREMITIES: No pedal edema, cyanosis, or clubbing.  NEUROLOGIC: Pt is drowsy and critical, not well responsive, hypotensive, so in trendelenburg position.  PSYCHIATRIC: The patient is drowsy.  SKIN: No obvious rash, lesion, or ulcer.   Physical Exam LABORATORY PANEL:   CBC  Recent Labs Lab 04/14/16 1103  WBC 13.0*  HGB 11.4*  HCT 39.2*  PLT 113*    ------------------------------------------------------------------------------------------------------------------  Chemistries   Recent Labs Lab 04/14/16 1103  NA 146*  K 5.2*  CL 107  CO2 30  GLUCOSE 133*  BUN 54*  CREATININE 2.16*  CALCIUM 7.8*  MG 2.3  AST 33  ALT 17  ALKPHOS 87  BILITOT 1.0   ------------------------------------------------------------------------------------------------------------------  Cardiac Enzymes  Recent Labs Lab 05/07/16 1827 04/14/16 1103  TROPONINI 0.05* 0.05*   ------------------------------------------------------------------------------------------------------------------  RADIOLOGY:  Ct Head Wo Contrast  Result Date: 2016/05/07 CLINICAL DATA:  Patient to ER from home via [facility] for c/o generalized weakness and BLE weakness x1 week. Altered mental status. EXAM: CT HEAD WITHOUT CONTRAST TECHNIQUE: Contiguous axial images were obtained from the base of the skull through the vertex without intravenous contrast. COMPARISON:  03/24/2014 FINDINGS: Brain: No evidence of acute infarction, hemorrhage, hydrocephalus, extra-axial collection or mass lesion/mass effect. Mild generalized cerebral atrophy and moderate chronic small vessel disease show no significant change. Vascular: No hyperdense vessel or unexpected calcification. Skull: Normal. Negative for fracture or focal lesion. Sinuses/Orbits: No acute finding. Other: None. IMPRESSION: No acute intracranial abnormality. 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 Electronically Signed   By: Myles Rosenthal M.D.   On: 2016/05/07 14:03   Dg Chest Port 1 View  Result Date: 04/14/2016 CLINICAL DATA:  Central line placement, intubated EXAM: PORTABLE CHEST 1 VIEW COMPARISON:  May 07, 2016 FINDINGS: Cardiomediastinal silhouette is stable. Endotracheal tube in place with tip 5.7 cm above the carina. Right IJ central line with tip in distal SVC. No pneumothorax. Worsening infiltrate  in left mid lung and left base. Aspiration pneumonia cannot be excluded. There is NG  tube coiled within stomach with tip in proximal stomach. No pneumothorax. IMPRESSION: Endotracheal tube in place with tip 5.7 cm above the carina. Right IJ central line with tip in distal SVC. No pneumothorax. Worsening infiltrate in left mid lung and left base. Aspiration pneumonia cannot be excluded. There is NG tube coiled within stomach with tip in proximal stomach. No pneumothorax. Electronically Signed   By: Natasha MeadLiviu  Pop M.D.   On: 04/14/2016 09:55   Dg Chest Port 1 View  Result Date: 04/08/2016 CLINICAL DATA:  Suzette BattiestBowling recently, fell 3 days ago and again today, generalized weakness, increased shortness of breath, increased oxygen requirements, COPD, coronary artery disease, chronic renal failure EXAM: PORTABLE CHEST 1 VIEW COMPARISON:  Portable exam 1402 hours compared to 11/24/2015 FINDINGS: Rotated to the RIGHT. Upper normal heart size. Atherosclerotic calcification aorta. Mediastinal contours and pulmonary vascularity normal. Interstitial infiltrates at the mid to lower lungs increased since prior study, could represent infection or edema. Mild RIGHT basilar atelectasis. Underlying emphysematous changes. No gross pleural effusion or pneumothorax. Bones demineralized. IMPRESSION: Emphysematous changes with RIGHT basilar atelectasis and mild interstitial infiltrates in mid to lower lungs which could represent mild edema or infection. Aortic atherosclerosis. Electronically Signed   By: Ulyses SouthwardMark  Boles M.D.   On: 03/30/2016 14:22   Dg Abd Portable 1v  Result Date: 04/14/2016 CLINICAL DATA:  OG tube placement. EXAM: PORTABLE ABDOMEN - 1 VIEW COMPARISON:  CT 02/04/2011. FINDINGS: A NG tube noted coiled stomach. Air-filled loops of small and large bowel noted with mild distention. Adynamic ileus cannot be excluded. Follow-up abdominal series to demonstrate resolution suggested. Aortoiliac atherosclerotic vascular  calcification. Degenerative changes lumbar spine . IMPRESSION: 1. OG tube noted coiled stomach. Air-filled loops mildly distended small and large bowel noted. Mild adynamic ileus cannot be excluded. 2. Aortoiliac atherosclerotic vascular disease . Electronically Signed   By: Maisie Fushomas  Register   On: 04/14/2016 09:54    ASSESSMENT AND PLAN:   Principal Problem:   Community acquired pneumonia of right lung Active Problems:   Acute metabolic encephalopathy   Acute on chronic respiratory failure with hypoxia (HCC)  * Acute hypoxic respi failure with Pneumonia and COPD exacerbation  Altered mental status due to metabolic encephalopathy and sepsis     Was on Rocephin + zithro.   Start on vanc+ zosyn now.   Send lactic acid.   IV fluid boluses.   tranasferred to ICU.    I also discussed with his wife , daughter and Dr. Dema SeverinMungal about critical condition.   He will be intubated in ICU.   IV steroids and nebs as per pulm now.  * Acute renal failure with hyperkalemia   VI fluid bolus, Kayexalate, Calcium gluconate.   Recheck.    All the records are reviewed and case discussed with Care Management/Social Workerr. Management plans discussed with the patient, family and they are in agreement.  CODE STATUS: Full code.  TOTAL TIME TAKING CARE OF THIS PATIENT: 50 critical care minutes.     POSSIBLE D/C IN 3-4? DAYS, DEPENDING ON CLINICAL CONDITION.   Altamese DillingVACHHANI, Dolce Sylvia M.D on 04/14/2016   Between 7am to 6pm - Pager - [phone]  After 6pm go to [email or web address] - password [email or web address]  [facility] [place] [facility]  Office  [phone]  CC: Primary care physician; Mila Merryonald Fisher, MD  Note: This dictation was prepared with Dragon dictation along with smaller phrase technology. Any transcriptional errors that result from this process are unintentional.

## 2016-04-14 NOTE — Procedures (Signed)
Procedure Note:  Orotracheal Intubation  Implied consent due to emergent nature of patient's condition. Correct Patient, Name & ID confirmed.  The patient was pre-oxygenated and then, under direct visualization, a 8.0 mm cuffed endotracheal tube was placed through the vocal cords into the trachea, using the Glidescope.  Total attempts made 1, using Glidescope During intubation an assistant applied gentle pressure to the cricoid cartilage.  Position confirmed by auscultation of lungs (good breath sounds bilaterally) and no stomach sounds.  Tube secured at 22 cm. Pulse ox 94 %. CO2 detector in place with appropriate color change.   Pt tolerated procedure well.  No complications were noted.   CXR ordered.   Stephanie AcreVishal Freddy Kinne, MD [place] Pager [phone] On Call Pager [phone]

## 2016-04-14 NOTE — Progress Notes (Signed)
Patient transferred to ICU 4 this am for intubation.  Patient continues to be intubated on 60%fio2, vital signs stable except requiring levophed gtt currently at 12mcg for map of 65.   CVP q4hr.  Patient sedated with fentanyl gtt currently at 100mcg.   Patient open eyes, grimaces, and moves arms upon stimulation but does not follow any commands. Droplet precaution initiated. Flu swab and troponin sent, result pending.  Last pCO2 at 76, Dana aware.  RN also notified Annabelle Harmanana, NP of patient with more frequent PVCs and PACs, no new orders. Tube feeding currently at 5630ml/hr with flush of 30ml every 4 hrs.   Foley in place and patent, output 1400 this shift.  Wife at bedside for most of shift.  Report given to night RN with no further questions.

## 2016-04-14 NOTE — Progress Notes (Signed)
RT responded to rapid response to room 210.  No orders given upon arrival to room 210, patient being emergently transferred to ICU 4, RT will await patient's arrival to ICU for more orders.

## 2016-04-14 NOTE — Progress Notes (Signed)
Dr Dema SeverinMungal notified of patient's ABG result, no new order.

## 2016-04-14 NOTE — Progress Notes (Signed)
Notified Annabelle Harmanana, NP of lactic acid at 2.3, Annabelle Harmanana acknowledge.

## 2016-04-14 NOTE — Progress Notes (Signed)
*  PRELIMINARY RESULTS* Echocardiogram 2D Echocardiogram has been performed.  Cristela BlueHege, Hill Mackie 04/14/2016, 2:50 PM

## 2016-04-14 NOTE — Significant Event (Signed)
Rapid Response Event Note  Overview: Time Called: 0810 Arrival Time: 0813 Event Type: Respiratory, Neurologic  Initial Focused Assessment: Arrived in room and patient flat, unresponsive to sternal rub/pain, NRB in place, patient hooked up to dinamap: SBP 70s, MAP 40s, HR ST, RR 50s shallow, O2 sats 84. Present were 2 RRT RNs, patient's RN, unit's charge RN, and NeurosurgeonAdministrative Coordinator. MD, Dr. Elisabeth PigeonVachhani had been called and 1L bolus started just as RRT RNs arrived. Patient placed in trendelenberg, while bolus started and BP did not change but O2 sat increased to 93 on NRB. CBG obtained and was 134. Patient transferred to ICU and another IV started. Dr. Dema SeverinMungal (intensivist) arrived in room shortly (had been called just before leaving patient's old room and wanted to wait to get ABG until patient arrived in ICU) and performed assessment. Patient intubated (MD decided to intubate first and , started on levophed infusion, central line placed, foley catheter placed, OG placed, x-ray confirmation of ETT and OG, and second 1 L bolus administered per Dr. Dema SeverinMungal. RRT RNs stayed with patient until transfer complete. Patient did not arouse during event. Patient's RN notified wife of change in status.   Interventions: Bolus already infusing, CBG checked, placed patient in trendelenberg, rapid transfer to ICU.  In ICU: intuabted, levophed infusion, second 1 bolus, central line placed, foley catheter placed, ABG, OG placed, chest x-ray  Plan of Care (if not transferred): transferred to ICU  Event Summary: Name of Physician Notified: Dr. Elisabeth PigeonVachhani at [phone]  Name of Consulting Physician Notified: Dr. Dema SeverinMungal at [phone]  Outcome: Stayed in room and stabalized, Transferred (Comment)  Event End Time: 0825  Adron BeneMOORE, Blong Busk [facility] EtowahELIZABETH

## 2016-04-14 NOTE — Progress Notes (Signed)
RT advanced ETT from 22 to 24 cm at lip per MD order without complication.

## 2016-04-14 NOTE — Procedures (Signed)
  Procedure Note: [facility]  Keith Watts , [id number] , [id number]  Indications: Hemodynamic monitoring / Intravenous access  Emergent placement.   A time-out was completed verifying correct patient, procedure and site.  A 3 lumen catheter available at the time of procedure.  The patient was placed in a dependent position appropriate for central line placement based on the vein to be cannulated.   The patient's RIGHT Internal Jugular Vein was prepped and draped in a sterile fashion.  1% Lidocaine WAS NOT used to anesthetize the surrounding skin area.   A 3 lumen catheter was introduced into the RIGHT Internal Jugular Vein using Seldinger technique, visualized under ultrasound.  The catheter was threaded smoothly over the guide wire and appropriate blood return was obtained.  Each lumen of the catheter was evacuated of air and flushed with sterile saline.  The catheter was then sutured in place to the skin and a sterile dressing applied.  Perfusion to the extremity distal to the point of catheter insertion was checked and found to be adequate.    Chest X-ray was ordered for confirmation of placement.  The patient tolerated the procedure well and there were no complications.  Stephanie AcreVishal Jalyssa Fleisher, MD [place] [facility] Pager [phone] (please enter [phone]) On Call Pager - [phone] (please enter [phone])

## 2016-04-14 NOTE — Progress Notes (Signed)
Pt continued to be very lethargic most of this shift. Pt's spouse had noted the lethargy to be his baseline, however, I had concern that the lethargy was not showing improvement. Although VSS, I contacted prime doc on call and asked Dr. Sheryle Hailiamond to see patient. Bolus of 500 NS ordered, NS increased to 100/hr. Labs returned report a potassium of 6.1, kayexelate enema ordered, D50 ordered, lasix and urine sample labs. Will continue to monitor and document effectiveness of course of action. Adelina MingsKim Kron Everton RN-BC

## 2016-04-14 NOTE — Progress Notes (Addendum)
Pharmacy Antibiotic Note  Keith Watts is a 75 y.o. male admitted on 07/14/15 with pneumonia. Patient not responding to sternal rub 10/18 transferred to ICU and subsequently intubated and sedated, requiring pressors. Abx escalated from axith/CTX to vancomycin and Zosyn. MRSA PCR pending.   Plan: Zosyn 3.375 g EI q 8 hours.   Vancomycin 1000 mg iv once then 750 mg iv q 24 hours with stacked dosing and a trough with the 5th dose. Goal trough 15-20 mcg/ml.   Will f/u MRSA PCR and potentially d/c vancomycin.   Height: 5\' 6"  (167.6 cm) Weight: 138 lb 12.8 oz (63 kg) IBW/kg (Calculated) : 63.8  Temp (24hrs), Avg:98 F (36.7 C), Min:96.8 F (36 C), Max:98.6 F (37 C)   Recent Labs Lab 02/08/16 1332 02/08/16 1827 04/14/16 0504  WBC 8.3  --   --   CREATININE 1.58* 1.45* 1.90*  LATICACIDVEN 1.9  --   --     Estimated Creatinine Clearance: 30.4 mL/min (by C-G formula based on SCr of 1.9 mg/dL (H)).    No Known Allergies  Antimicrobials this admission: ceftriaxone 10/18 >>  10/19 azithromycin 10/18 >>  10/19 Vancomycin 10/19 >> Zosyn 10/19 >>  Dose adjustments this admission:  Microbiology results: 10/19: MRSA PCR: pending 10/19: TA: pending 10/18 BCx: NGTD x 4 10/18 UCx: Sent  10/18 Sputum: Sent   Thank you for allowing pharmacy to be a part of this patient's care.  [name], [name], PharmD, BCPS Clinical Pharmacist 04/14/2016 10:39 AM

## 2016-04-14 NOTE — Progress Notes (Signed)
Patient not responsive to sternal rub. RR 56, HR 110, BP 74/42. Notified attending MD. Rapid Response called. Patient transferred to ICU, report given to receiving RN.

## 2016-04-14 NOTE — Progress Notes (Signed)
PT Cancellation Note  Patient Details Name: Keith Watts MRN: [id number] DOB: [birth year]   Cancelled Treatment:    Reason Eval/Treat Not Completed: Other (comment). Consult received and chart reviewed. Pt now with change in status to CCU with elevated K+ (6.1) and elevated HR. Pt is not appropriate for therapy evaluation at this time. Will complete current order, please re-order when medically stable.   Vitaliy Eisenhour 04/14/2016, 11:01 AM  Elizabeth PalauStephanie Ethyn Schetter, PT, DPT [phone]

## 2016-04-14 NOTE — Progress Notes (Signed)
Nutrition Follow-up  DOCUMENTATION CODES:   Not applicable  INTERVENTION:  Received verbal order to begin tubefeeding Vital 1.2 goal rate 5650mL/hr, begin @ 2020mL/hr, increase by 10 every 4 hours to goal rate; provides 1440 calories, 90gm protein and 973cc free water PePUP protocol after reaching goal  NUTRITION DIAGNOSIS:   Inadequate oral intake related to inability to eat as evidenced by NPO status.  GOAL:   Patient will meet greater than or equal to 90% of their needs  MONITOR:   Labs, Weight trends, Vent status, TF tolerance, I & O's, Skin  REASON FOR ASSESSMENT:   Ventilator    ASSESSMENT:   Keith Watts  is a 75 y.o. male with a known history of COPD, chronic respiratory failure on home oxygen 3 L, CAD and pneumonia. The patient was sent to the ED from home due to above chief complaint. The patient is confused, unable to answer questions. According to the patient's wife and daughter, the patient has been feeling weak for the past few days.  Patient is currently intubated on ventilator support MV: 6.7 L/min Temp (24hrs), Avg:98 F (36.7 C), Min:96.8 F (36 C), Max:98.6 F (37 C) Propofol: None Labs and medications reviewed: Na 147, K 6.1,  Levo GTT Pt has been confused, weak for past few days, possibly at risk of refeeding. Unsure of PO intake PTA, no family at bedside during visit. Unable to complete Nutrition-Focused physical exam at this time.   Diet Order:  Diet NPO time specified  Skin:  Reviewed, no issues  Last BM:  10/16  Height:   Ht Readings from Last 1 Encounters:  03/27/2016 5\' 6"  (1.676 m)    Weight:   Wt Readings from Last 1 Encounters:  04/10/2016 138 lb 12.8 oz (63 kg)    Ideal Body Weight:  64.54 kg  BMI:  Body mass index is 22.4 kg/m.  Estimated Nutritional Needs:   Kcal:  1439 calories  Protein:  75-95 gm  Fluid:  >/= 1.4L  EDUCATION NEEDS:   No education needs identified at this time

## 2016-04-14 NOTE — Progress Notes (Signed)
Fentanyl 100mcg and versed 2mg  given for emergent intubation per Dr Dema SeverinMungal.

## 2016-04-15 ENCOUNTER — Inpatient Hospital Stay: Payer: Medicare Other

## 2016-04-15 DIAGNOSIS — R748 Abnormal levels of other serum enzymes: Secondary | ICD-10-CM

## 2016-04-15 DIAGNOSIS — J181 Lobar pneumonia, unspecified organism: Secondary | ICD-10-CM

## 2016-04-15 DIAGNOSIS — I959 Hypotension, unspecified: Secondary | ICD-10-CM

## 2016-04-15 DIAGNOSIS — R6521 Severe sepsis with septic shock: Secondary | ICD-10-CM

## 2016-04-15 DIAGNOSIS — R0603 Acute respiratory distress: Secondary | ICD-10-CM

## 2016-04-15 DIAGNOSIS — A419 Sepsis, unspecified organism: Principal | ICD-10-CM

## 2016-04-15 DIAGNOSIS — R0600 Dyspnea, unspecified: Secondary | ICD-10-CM

## 2016-04-15 LAB — GLUCOSE, CAPILLARY
GLUCOSE-CAPILLARY: 160 mg/dL — AB (ref 65–99)
GLUCOSE-CAPILLARY: 165 mg/dL — AB (ref 65–99)
GLUCOSE-CAPILLARY: 152 mg/dL — AB (ref 65–99)
Glucose-Capillary: 107 mg/dL — ABNORMAL HIGH (ref 65–99)
Glucose-Capillary: 121 mg/dL — ABNORMAL HIGH (ref 65–99)
Glucose-Capillary: 138 mg/dL — ABNORMAL HIGH (ref 65–99)

## 2016-04-15 LAB — BASIC METABOLIC PANEL
ANION GAP: 5 (ref 5–15)
Anion gap: 6 (ref 5–15)
BUN: 54 mg/dL — ABNORMAL HIGH (ref 6–20)
BUN: 50 mg/dL — AB (ref 6–20)
CALCIUM: 7.7 mg/dL — AB (ref 8.9–10.3)
CHLORIDE: 105 mmol/L (ref 101–111)
CO2: 36 mmol/L — ABNORMAL HIGH (ref 22–32)
CO2: 36 mmol/L — AB (ref 22–32)
CREATININE: 1.78 mg/dL — AB (ref 0.61–1.24)
Calcium: 7.6 mg/dL — ABNORMAL LOW (ref 8.9–10.3)
Chloride: 105 mmol/L (ref 101–111)
Creatinine, Ser: 1.86 mg/dL — ABNORMAL HIGH (ref 0.61–1.24)
GFR calc Af Amer: 42 mL/min — ABNORMAL LOW (ref >60)
GFR calc non Af Amer: 36 mL/min — ABNORMAL LOW (ref >60)
GFR, EST AFRICAN AMERICAN: 39 mL/min — AB (ref >60)
GFR, EST NON AFRICAN AMERICAN: 34 mL/min — AB (ref >60)
GLUCOSE: 181 mg/dL — AB (ref 65–99)
Glucose, Bld: 109 mg/dL — ABNORMAL HIGH (ref 65–99)
POTASSIUM: 3.5 mmol/L (ref 3.5–5.1)
Potassium: 3.1 mmol/L — ABNORMAL LOW (ref 3.5–5.1)
SODIUM: 146 mmol/L — AB (ref 135–145)
SODIUM: 147 mmol/L — AB (ref 135–145)

## 2016-04-15 LAB — MAGNESIUM
MAGNESIUM: 2 mg/dL (ref 1.7–2.4)
Magnesium: 2.1 mg/dL (ref 1.7–2.4)

## 2016-04-15 LAB — PROTIME-INR
INR: 1.29
PROTHROMBIN TIME: 16.2 s — AB (ref 11.4–15.2)

## 2016-04-15 LAB — CBC
HCT: 33.6 % — ABNORMAL LOW (ref 40.0–52.0)
HEMOGLOBIN: 10.4 g/dL — AB (ref 13.0–18.0)
MCH: 27.4 pg (ref 26.0–34.0)
MCHC: 30.9 g/dL — ABNORMAL LOW (ref 32.0–36.0)
MCV: 88.7 fL (ref 80.0–100.0)
PLATELETS: 87 10*3/uL — AB (ref 150–440)
RBC: 3.79 MIL/uL — AB (ref 4.40–5.90)
RDW: 16 % — ABNORMAL HIGH (ref 11.5–14.5)
WBC: 12.6 10*3/uL — AB (ref 3.8–10.6)

## 2016-04-15 LAB — BLOOD GAS, ARTERIAL
Acid-Base Excess: 11.4 mmol/L — ABNORMAL HIGH (ref 0.0–2.0)
BICARBONATE: 40.3 mmol/L — AB (ref 20.0–28.0)
FIO2: 0.6
MECHVT: 450 mL
O2 Saturation: 95.3 %
PEEP/CPAP: 5 cmH2O
Patient temperature: 37
pCO2 arterial: 80 mmHg (ref 32.0–48.0)
pH, Arterial: 7.31 — ABNORMAL LOW (ref 7.350–7.450)
pO2, Arterial: 84 mmHg (ref 83.0–108.0)

## 2016-04-15 LAB — URINE CULTURE: Culture: NO GROWTH

## 2016-04-15 LAB — PHOSPHORUS: PHOSPHORUS: 3.6 mg/dL (ref 2.5–4.6)

## 2016-04-15 LAB — POTASSIUM: Potassium: 3.1 mmol/L — ABNORMAL LOW (ref 3.5–5.1)

## 2016-04-15 LAB — PROCALCITONIN: PROCALCITONIN: 0.34 ng/mL

## 2016-04-15 LAB — HEPARIN LEVEL (UNFRACTIONATED): HEPARIN UNFRACTIONATED: 0.56 [IU]/mL (ref 0.30–0.70)

## 2016-04-15 MED ORDER — POTASSIUM CHLORIDE 10 MEQ/50ML IV SOLN
10.0000 meq | INTRAVENOUS | Status: AC
  Administered 2016-04-15 (×2): 10 meq via INTRAVENOUS
  Filled 2016-04-15 (×2): qty 50

## 2016-04-15 MED ORDER — SENNOSIDES-DOCUSATE SODIUM 8.6-50 MG PO TABS
1.0000 | ORAL_TABLET | Freq: Two times a day (BID) | ORAL | Status: DC
  Administered 2016-04-15 – 2016-04-18 (×7): 1 via ORAL
  Filled 2016-04-15 (×7): qty 1

## 2016-04-15 MED ORDER — SODIUM CHLORIDE 0.9 % IV SOLN
1000.0000 mg | Freq: Once | INTRAVENOUS | Status: AC
  Administered 2016-04-15: 1000 mg via INTRAVENOUS
  Filled 2016-04-15: qty 10

## 2016-04-15 MED ORDER — SODIUM CHLORIDE 0.9 % IV SOLN
500.0000 mg | Freq: Two times a day (BID) | INTRAVENOUS | Status: DC
  Filled 2016-04-15 (×2): qty 5

## 2016-04-15 MED ORDER — INSULIN ASPART 100 UNIT/ML ~~LOC~~ SOLN
0.0000 [IU] | SUBCUTANEOUS | Status: DC
  Administered 2016-04-15: 2 [IU] via SUBCUTANEOUS
  Administered 2016-04-15 – 2016-04-16 (×2): 3 [IU] via SUBCUTANEOUS
  Administered 2016-04-16 (×3): 2 [IU] via SUBCUTANEOUS
  Administered 2016-04-16: 3 [IU] via SUBCUTANEOUS
  Administered 2016-04-16 – 2016-04-18 (×8): 2 [IU] via SUBCUTANEOUS
  Administered 2016-04-18: 3 [IU] via SUBCUTANEOUS
  Administered 2016-04-18 (×2): 2 [IU] via SUBCUTANEOUS
  Filled 2016-04-15 (×6): qty 2
  Filled 2016-04-15 (×2): qty 3
  Filled 2016-04-15: qty 2
  Filled 2016-04-15: qty 3
  Filled 2016-04-15 (×2): qty 2
  Filled 2016-04-15: qty 3
  Filled 2016-04-15 (×3): qty 2
  Filled 2016-04-15: qty 1
  Filled 2016-04-15: qty 2

## 2016-04-15 MED ORDER — HEPARIN (PORCINE) IN NACL 100-0.45 UNIT/ML-% IJ SOLN
900.0000 [IU]/h | INTRAMUSCULAR | Status: DC
  Administered 2016-04-15: 1000 [IU]/h via INTRAVENOUS
  Administered 2016-04-16: 900 [IU]/h via INTRAVENOUS
  Filled 2016-04-15 (×4): qty 250

## 2016-04-15 MED ORDER — FENTANYL 2500MCG IN NS 250ML (10MCG/ML) PREMIX INFUSION
25.0000 ug/h | INTRAVENOUS | Status: DC
  Administered 2016-04-15: 200 ug/h via INTRAVENOUS
  Administered 2016-04-16: 150 ug/h via INTRAVENOUS
  Administered 2016-04-17 – 2016-04-18 (×2): 125 ug/h via INTRAVENOUS
  Filled 2016-04-15 (×4): qty 250

## 2016-04-15 MED ORDER — ACETAMINOPHEN 325 MG PO TABS
650.0000 mg | ORAL_TABLET | ORAL | Status: DC | PRN
  Administered 2016-04-15 – 2016-04-16 (×3): 650 mg via ORAL
  Filled 2016-04-15 (×2): qty 2

## 2016-04-15 MED ORDER — FENTANYL BOLUS VIA INFUSION
25.0000 ug | INTRAVENOUS | Status: DC | PRN
  Administered 2016-04-15: 25 ug via INTRAVENOUS
  Filled 2016-04-15: qty 25

## 2016-04-15 MED ORDER — VITAL AF 1.2 CAL PO LIQD
1000.0000 mL | ORAL | Status: DC
  Administered 2016-04-15 – 2016-04-20 (×5): 1000 mL

## 2016-04-15 NOTE — Progress Notes (Signed)
ANTICOAGULATION CONSULT NOTE - Follow Up Consult  Pharmacy Consult for heparin drip monitoring Indication: DVT  No Known Allergies  Patient Measurements: Height: 5' 6"  (167.6 cm) Weight: 142 lb 3.2 oz (64.5 kg) IBW/kg (Calculated) : 63.8 Heparin Dosing Weight: 63 kg  Vital Signs: Temp: 98.9 F (37.2 C) (10/20 1950) Temp Source: Axillary (10/20 1950) BP: 97/58 (10/20 2000) Pulse Rate: 85 (10/20 2000)  Labs:  Recent Labs  04/05/2016 1332 04/14/2016 1827  04/14/16 1103 04/14/16 1713 04/15/16 0508 04/15/16 1108 04/15/16 1736 04/15/16 2042  HGB 12.2*  --   --  11.4*  --   --  10.4*  --   --   HCT 39.4*  --   --  39.2*  --   --  33.6*  --   --   PLT 101*  --   --  113*  --   --  87*  --   --   APTT 29  --   --   --   --   --   --   --   --   LABPROT 13.7  --   --   --   --   --  16.2*  --   --   INR 1.05  --   --   --   --   --  1.29  --   --   HEPARINUNFRC  --   --   --   --   --   --  <0.10*  --  0.56  CREATININE 1.58* 1.45*  < > 2.16*  --  1.86*  --  1.78*  --   CKTOTAL  --   --   --  226  --   --   --   --   --   TROPONINI 0.04* 0.05*  --  0.05* 0.11*  --   --   --   --   < > = values in this interval not displayed.  Estimated Creatinine Clearance: 32.9 mL/min (by C-G formula based on SCr of 1.78 mg/dL (H)).   Medications:  Scheduled:  . aspirin  81 mg Oral Daily  . budesonide (PULMICORT) nebulizer solution  0.5 mg Nebulization BID  . chlorhexidine gluconate (MEDLINE KIT)  15 mL Mouth Rinse BID  . famotidine  20 mg Oral QHS  . finasteride  5 mg Oral QHS  . insulin aspart  0-15 Units Subcutaneous Q4H  . ipratropium-albuterol  3 mL Nebulization Q6H  . mouth rinse  15 mL Mouth Rinse 10 times per day  . piperacillin-tazobactam (ZOSYN)  IV  3.375 g Intravenous Q8H  . senna-docusate  1 tablet Oral BID  . sodium chloride flush  10-40 mL Intracatheter Q12H    Assessment: Pharmacy is consulted to dose and monitor heparin drip in this 75 year old male for possible DVT.  Patient was not taking anticoagulants prior to admission, but was on heparin subcutaneously q8h for DVT prophylaxis.   Goal of Therapy:  Heparin level 0.3-0.7 units/ml Monitor platelets by anticoagulation protocol: Yes   Plan:  10/20 @ 2042, HL = 0.56 which is therapeutic. Continue heparin drip at current rate of 1000 units/hr. Will check confirmatory heparin level in 8 hours and CBC with AM labs tomorrow.  Lenis Noon, PharmD Clinical Pharmacist 04/15/2016,9:46 PM

## 2016-04-15 NOTE — Progress Notes (Signed)
PULMONARY / CRITICAL CARE MEDICINE   Name: Keith Watts MRN: [id number] DOB: 07/25/1940    ADMISSION DATE:  04/14/2016 CONSULTATION DATE:  04/14/2016  REFERRING MD:  Dr. Elisabeth PigeonVachhani  CHIEF COMPLAINT:  Weakness  Patient profile: This is a 75 yo male with a PMH of Pneumonia, Diastolic dysfunction (Echo 09/2014 EF 50-55%), COPD, Former smoker (1 to 1.5 PPD for 40 yrs.), Chronic O2 3L via nasal cannula, CAD, PCI 2016, BPH, and Allergic Rhinitis.  [facility]. On 10/19  rapid response was initiated .Upon rapid response nurses arrival it was noted the pt was unreesponsive to sternal rub and was hypotensive, therefore pt was transferred to ICU and intubated.  PCCM consulted for acute on chronic hypoxic hypercapnic respiratory failure secondary to bilateral lower lobe pneumonia and AECOPD, pulmonary edema requiring mechanical intubation and septic shock requiring pressors.  Subjective: Patient is lightly sedated , on ventilator. Patient was a febrile overnight .  No acute issues overnight.  VITAL SIGNS: BP (!) 101/55   Pulse 92   Temp 99.8 F (37.7 C)   Resp 20   Ht 5\' 6"  (1.676 m)   Wt 64.5 kg (142 lb 3.2 oz)   SpO2 95%   BMI 22.95 kg/m   HEMODYNAMICS: CVP:  [9 mmHg-21 mmHg] 14 mmHg  VENTILATOR SETTINGS: Vent Mode: PRVC FiO2 (%):  [40 %-60 %] 60 % Set Rate:  [15 bmp-24 bmp] 24 bmp Vt Set:  [450 mL] 450 mL PEEP:  [5 cmH20] 5 cmH20 Plateau Pressure:  [17 cmH20] 17 cmH20  INTAKE / OUTPUT: I/O last 3 completed shifts: In: 1252.1 [P.O.:60; I.V.:1022.1; NG/GT:120; IV Piggyback:50] Out: 1400 [Urine:1400]  PHYSICAL EXAMINATION: General:  Chronically ill appearing Caucasian male Neuro:  Sedated on ventilator, PERRL, does not follow commands HEENT:  Supple, no JVD Cardiovascular:  Sinus tachycardia, s1s2, no M/R/G Lungs:  Diminished throughout, even, non labored Abdomen:  Hypoactive BS x4, soft, non tender, non  distended Musculoskeletal:  Normal bulk and tone Skin:  Intact no rashes or lesions  LABS:  BMET  Recent Labs Lab 04/19/2016 1332 04/19/2016 1827 04/14/16 0504 04/14/16 1103 04/14/16 1713  NA 145  --  147* 146*  --   K 4.7  --  6.1* 5.2* 3.5  CL 97*  --  104 107  --   CO2 41*  --  37* 30  --   BUN 48*  --  55* 54*  --   CREATININE 1.58* 1.45* 1.90* 2.16*  --   GLUCOSE 145*  --  101* 133*  --     Electrolytes  Recent Labs Lab 04/02/2016 1332 04/14/16 0504 04/14/16 1103  CALCIUM 8.8* 8.0* 7.8*  MG  --   --  2.3  PHOS  --   --  5.8*    CBC  Recent Labs Lab 04/23/2016 1332 04/14/16 1103  WBC 8.3 13.0*  HGB 12.2* 11.4*  HCT 39.4* 39.2*  PLT 101* 113*    Coag's  Recent Labs Lab 04/06/2016 1332  APTT 29  INR 1.05    Sepsis Markers  Recent Labs Lab 04/25/2016 1332 04/14/16 1103 04/14/16 1713  LATICACIDVEN 1.9 2.3* 1.1  PROCALCITON  --  0.16  --     ABG  Recent Labs Lab 04/14/16 1447 04/14/16 1756 04/15/16 0412  PHART 7.31* 7.34* 7.31*  PCO2ART 78* 76* 80*  PO2ART 53* 56* 84    Liver Enzymes  Recent Labs Lab 04/19/2016 1332 04/14/16 1103  AST 29 33  ALT 19 17  ALKPHOS 85 87  BILITOT 0.8 1.0  ALBUMIN 3.3* 2.9*    Cardiac Enzymes  Recent Labs Lab 04/12/2016 1827 04/14/16 1103 04/14/16 1713  TROPONINI 0.05* 0.05* 0.11*    Glucose  Recent Labs Lab 04/14/16 0819 04/14/16 1606 04/14/16 1951 04/14/16 2342 04/15/16 0432  GLUCAP 134* 168* 149* 161* 160*    Imaging Dg Chest Port 1 View  Result Date: 04/14/2016 CLINICAL DATA:  Central line placement, intubated EXAM: PORTABLE CHEST 1 VIEW COMPARISON:  04/04/2016 FINDINGS: Cardiomediastinal silhouette is stable. Endotracheal tube in place with tip 5.7 cm above the carina. Right IJ central line with tip in distal SVC. No pneumothorax. Worsening infiltrate in left mid lung and left base. Aspiration pneumonia cannot be excluded. There is NG tube coiled within stomach with tip in proximal  stomach. No pneumothorax. IMPRESSION: Endotracheal tube in place with tip 5.7 cm above the carina. Right IJ central line with tip in distal SVC. No pneumothorax. Worsening infiltrate in left mid lung and left base. Aspiration pneumonia cannot be excluded. There is NG tube coiled within stomach with tip in proximal stomach. No pneumothorax. Electronically Signed   By: Natasha Mead M.D.   On: 04/14/2016 09:55   Dg Abd Portable 1v  Result Date: 04/14/2016 CLINICAL DATA:  OG tube placement. EXAM: PORTABLE ABDOMEN - 1 VIEW COMPARISON:  CT 02/04/2011. FINDINGS: A NG tube noted coiled stomach. Air-filled loops of small and large bowel noted with mild distention. Adynamic ileus cannot be excluded. Follow-up abdominal series to demonstrate resolution suggested. Aortoiliac atherosclerotic vascular calcification. Degenerative changes lumbar spine . IMPRESSION: 1. OG tube noted coiled stomach. Air-filled loops mildly distended small and large bowel noted. Mild adynamic ileus cannot be excluded. 2. Aortoiliac atherosclerotic vascular disease . Electronically Signed   By: Maisie Fus  Register   On: 04/14/2016 09:54     STUDIES:  CT of head 10/18>>No acute intracranial abnormality.  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 10/19 ECHO>>The cavity size was moderately dilated. Systolicfunction was normal. The estimated ejection fraction was 70%  CULTURES: Blood x2 10/18>>negative>> Sputum 10/19>> Urine>>  ANTIBIOTICS: Azithromycin 10/18>>x1 dose Ceftriaxone 10/18>>x1 dose Zosyn 10/19>> Vancomycin 10/19>>  SIGNIFICANT EVENTS: 10/18-Pt admitted [facility] 10/19-Rapid response initiated due to pt developing acute on chronic hypoxic hypercapnic respiratory failure secondary to bilateral lobe pneumonia and AECOPD requiring mechanical intubation and septic shock requiring pressors  LINES/TUBES: PIV's x3>> Right IJ 10/19>> ETT 10/19>>  ASSESSMENT / PLAN:  PULMONARY A: Acute on  chronic hypoxic hypercapnic respiratory failure secondary to bilateral lobe pneumonia and AECOPD, pulmonary edema  Mechanical Ventilation Hx: Allergic rhinitis and Home O2 at 3L P:   Full vent support SBT trials in am Continue Bronchodilators Routine ABG CXR in am  Maintain O2 sats 88% to 92% Continue Vancomycin/zosyn  CARDIOVASCULAR A:  Hypotension secondary to septic shock Congestive Heart Failure Hx: CAD, PCI (2016 due to mLAD 30% and mLCX 99% occlusion) and diastolic dysfunction) Elevated troponins P:  Prn levophed gtt to maintain map >65 Hold  lasix for now due to hypotension Continue proscar and atorvastatin  Echo 10/19 with EF of 70% Telemetry monitoring  CVP monitoring  Trend troponin's Trend BNP  RENAL A:   Acute on chronic renal failure Hyperkalemia-resolved Mild Hypernatremia  P:   Repeat BMP today 10/19 Replace electrolytes as indicated Strict I/o Foley in place Follow chemistry GASTROINTESTINAL A:   No acute issues P:   Pepcid for PUD prophylaxis Tube feeds  HEMATOLOGIC A:   Anemia  P:  Heparin for VTE prophylaxis Trend CBC's Monitor for s/sx of bleeding Transfuse for hgb <7  INFECTIOUS A:   Bilateral lobe pneumonia Elevated lactic acid-resolved P:   Trend WBC and monitor fever curve Continue abx as listed above Follow cultures  ENDOCRINE A:   No acute issues  P:   CBG's q4hrs Hypoglycemia Protocol   NEUROLOGIC A:   Acute encephalopathy likely secondary to infectious process and respiratory failure P:   RASS goal: 0 to -1 Prn fentanyl and versed to maintain RASS goal WUA daily Lights on during the day      Bincy Varughese,AG-ACNP Pulmonary & Critical Care  Pt. Seen and examined with NP, agree with assessment and plan. Acute hypoxic respiratory failure with septic shock, hypotension, AECOPD. Decreased air entry bilaterally. Continued hypotension on pressor support. Will wean down levophed as tolerated, then wean down  vent support.  Continue abx.   Wells Guiles, M.D.  04/15/2016  Critical Care Attestation.  I have personally obtained a history, examined the patient, evaluated laboratory and imaging results, formulated the assessment and plan and placed orders. The Patient requires high complexity decision making for assessment and support, frequent evaluation and titration of therapies, application of advanced monitoring technologies and extensive interpretation of multiple databases. The patient has critical illness that could lead imminently to failure of 1 or more organ systems and requires the highest level of physician preparedness to intervene.  Critical Care Time devoted to patient care services described in this note is 35 minutes and is exclusive of time spent in procedures.

## 2016-04-15 NOTE — Progress Notes (Addendum)
Pt having intermittent twitching movement of bilateral legs and arms with eyelid twitching questionable seizure activity.  Upon examination pupils 3 mm bilaterally reactive, pt withdrawing from painful stimulation, however does not follow commands.  According to pts family he does not have a seizure history and they deny alcohol or drug abuse.  Orders placed for stat CT of head, iv Keppra, will check electrolytes and replace as indicated and EEG, Heparin drip currently on hold for now will restart once CT of head results are back to rule out hemorrhagic stroke will continue to monitor and assess pt.  Keith Watts, AGNP  Pulmonary/Critical Care Pager [phone] (please enter 7 digits) PCCM Consult Pager [phone] (please enter 7 digits)

## 2016-04-15 NOTE — Progress Notes (Signed)
Nutrition Follow-up  DOCUMENTATION CODES:   Not applicable  INTERVENTION:  -Discussed in ICU rounds with MD Ram. Continue tube feeding of vital 1.2 at goal rate of 6450ml/hr. Will start PEP UP protocol. May need to increase free water if Na level continues to be elevated.    NUTRITION DIAGNOSIS:   Inadequate oral intake related to inability to eat as evidenced by NPO status.  Ongoing but nutrition being addressed with tube feeding  GOAL:   Patient will meet greater than or equal to 90% of their needs  Meeting needs with tube feeding  MONITOR:   Labs, Weight trends, Vent status, TF tolerance, I & O's, Skin  REASON FOR ASSESSMENT:   Ventilator    ASSESSMENT:   Pt remains on vent.  Medications reviewed: levophed   Tube feeding via OG tube at 4450ml/hr  Labs reviewed: Na 146, BUN 54, creatinine 1.86, glucose 181, Mg and phos WNL  UOP: 1800ml last 24 hr   Diet Order:  Diet NPO time specified  Skin:  Reviewed, no issues  Last BM:  10/19  Height:   Ht Readings from Last 1 Encounters:  04/24/2016 5\' 6"  (1.676 m)    Weight:   Wt Readings from Last 1 Encounters:  04/15/16 142 lb 3.2 oz (64.5 kg)    Ideal Body Weight:  64.54 kg  BMI:  Body mass index is 22.95 kg/m.  Estimated Nutritional Needs:   Kcal:  1439 calories  Protein:  75-95 gm  Fluid:  >/= 1.4L  EDUCATION NEEDS:   No education needs identified at this time  Baili Stang B. Freida BusmanAllen, RD, LDN [phone] (pager) Weekend/On-Call pager [phone])

## 2016-04-15 NOTE — Progress Notes (Signed)
RT transported patient to and from CT on trilogy vent without complication.  Upon arrival back to ICU4, patient was placed back on servo vent.  Patient tolerated transport well, will continue to monitor.

## 2016-04-15 NOTE — Consult Note (Signed)
Cardiology Consultation Note  Patient ID: Keith Watts, MRN: [id number], DOB/AGE: 06-30-1940 75 y.o. Admit date: 03/27/2016   Date of Consult: 04/15/2016 Primary Physician: Lelon Huh, MD Primary Cardiologist: Dr. Fletcher Anon, MD Requesting Physician: Dr. Stevenson Clinch, MD  Chief Complaint: Weakness/cough Reason for Consult: Acute on chronic diastolic CHF  HPI: 75 y.o. male with h/o CAD s/p PCI to LCx in 11/2014 with residual disease medically managed in the LAD and RCA, chronic diastolic CHF, on home oxygen at 2-3 L 2/2 COPD with long standing tobacco abuse, and multiple episodes of PNA who presented to [facility] with 3-4 day history of increased weakness and was found to have septic shock in the setting of PNA. Cardiology is consulted for acute on chronic diastolic CHF.  He was previously hospitalized in 11/2014 with PNA and respiratory failure. He was noted to have an elevated troponin suggestive of NSTEMI. He underwent cardiac catheterization that showed mild LAD stenosis at 30%, 99% mid LCx stenosis, and 40% RCA stenosis. He underwent successful PCI/DES to the mid LCx with the residual disease medically managed. He was noted to have normal LV ssytolic function and a moderately elevated LVEDP. He was last seen by Dr. Fletcher Anon in 09/2015 and was doing well at that time.   Patient presented to the ED on 10/18 with a 3-4 day history of increased weakness, cough, and decreaed PO intake. He was admitted on 10/19 with PNA, ultimately having a rapid response as it was noted he was unresponsive to sternal rub and was hypotensive requiring intubation and transfer to the ICU. He was found to have acute on chronic hypoxic respiratory failure with hypercapnia in the setting of PNA, AECOPD, and pulmonary edema as well as septic shock requiring pressors. BNP was noted to be 1600 in the setting of acute renal failure. Troponin minimally elevated with a peak of 0.05. Lasix has been held 2/2 hypotension.   Past Medical  History:  Diagnosis Date  . Allergic rhinitis   . BPH (benign prostatic hyperplasia)   . CAD (coronary artery disease)    a. 1 vessel CAD by cardiac cath in 2010 managed medically; b. cath 11/2014 mLAD 30%, mLCx 99% s/p PCI/DES 0%, mRCA 40%, mod calcified coronaries, nl LVSF, mod elevated LVEDP, mild AS  . Chicken pox   . Chronic respiratory failure (HCC)    a. on 3L O2 via nasal cannula  . COPD (chronic obstructive pulmonary disease) ([place])   . Diastolic dysfunction    a. echo 09/2014: EF 50-55%, impaired relaxation of LV diastolic filling, nl RV size & sys fxn, mildly dilated LA, mild MR, mild Ao stenosis, nl PASP  . Measles   . Mumps   . Pneumonia    a. admission to [facility] 09/2014 & 11/2014  . Vitamin D deficiency       Most Recent Cardiac Studies: Echo 04/14/2016: Hyperdynamic LV systolic function with an EF of 70%, GR1DD, mild to moderate aortic stenosis, moderately dilated RV.    Surgical History:  Past Surgical History:  Procedure Laterality Date  . APPENDECTOMY    . CARDIAC CATHETERIZATION N/A 11/28/2014   Procedure: Left Heart Cath and Coronary Angiography;  Surgeon: Wellington Hampshire, MD;  Location: [place] [facility];  Service: Cardiovascular;  Laterality: N/A;  please schedule for lunch time with Dr. Fletcher Anon, MD  . CARDIAC CATHETERIZATION N/A 11/28/2014   Procedure: Coronary Stent Intervention;  Surgeon: Wellington Hampshire, MD;  Location: [place] [facility];  Service: Cardiovascular;  Laterality: N/A;  .  EYE SURGERY    . TONSILLECTOMY       Home Meds: Prior to Admission medications   Medication Sig Start Date End Date Taking? Authorizing Provider  aspirin EC 81 MG tablet Take 81 mg by mouth daily.   Yes Historical Provider, MD  atorvastatin (LIPITOR) 40 MG tablet TAKE 1 TABLET (40 MG TOTAL) BY MOUTH DAILY AT 6 PM. 10/12/15  Yes Birdie Sons, MD  finasteride (PROSCAR) 5 MG tablet Take 5 mg by mouth at bedtime.   Yes Historical Provider, MD  fluticasone-salmeterol (ADVAIR  HFA) 115-21 MCG/ACT inhaler Inhale 2 puffs into the lungs 2 (two) times daily.   Yes Historical Provider, MD  furosemide (LASIX) 20 MG tablet Take 1 tablet (20 mg total) by mouth daily. 01/08/16  Yes Wellington Hampshire, MD  OXYGEN 3 L continuous.  12/21/06   Historical Provider, MD    Inpatient Medications:  . aspirin  81 mg Oral Daily  . budesonide (PULMICORT) nebulizer solution  0.5 mg Nebulization BID  . chlorhexidine gluconate (MEDLINE KIT)  15 mL Mouth Rinse BID  . famotidine  20 mg Oral QHS  . finasteride  5 mg Oral QHS  . heparin  5,000 Units Subcutaneous Q8H  . ipratropium-albuterol  3 mL Nebulization Q6H  . mouth rinse  15 mL Mouth Rinse 10 times per day  . piperacillin-tazobactam (ZOSYN)  IV  3.375 g Intravenous Q8H  . sodium chloride flush  10-40 mL Intracatheter Q12H   . feeding supplement (VITAL AF 1.2 CAL) 1,000 mL (04/14/16 1800)  . fentaNYL infusion INTRAVENOUS 100 mcg/hr (04/14/16 1653)  . norepinephrine (LEVOPHED) Adult infusion 12.053 mcg/min (04/15/16 0612)    Allergies: No Known Allergies  Social History   Social History  . Marital status: Married    Spouse name: N/A  . Number of children: 3  . Years of education: N/A   Occupational History  . [employer]    Social History Main Topics  . Smoking status: Former Smoker    Packs/day: 2.00    Years: 50.00    Quit date: 06/27/2008  . Smokeless tobacco: Never Used  . Alcohol use No  . Drug use: No  . Sexual activity: Not on file   Other Topics Concern  . Not on file   Social History Narrative  . No narrative on file     Family History  Problem Relation Age of Onset  . Cancer - Lung Mother   . Cancer - Colon Father   . Colon cancer Father   . Liver cancer Brother   . Pancreatic cancer Brother   . Prostate cancer Brother   . Liver cancer Brother      Review of Systems: Review of Systems  Unable to perform ROS: Intubated    Labs:  Recent Labs  04/14/2016 1332 04/14/2016 1827 04/14/16 1103  04/14/16 1713  CKTOTAL  --   --  226  --   TROPONINI 0.04* 0.05* 0.05* 0.11*   Lab Results  Component Value Date   WBC 13.0 (H) 04/14/2016   HGB 11.4 (L) 04/14/2016   HCT 39.2 (L) 04/14/2016   MCV 94.7 04/14/2016   PLT 113 (L) 04/14/2016    Recent Labs Lab 04/14/16 1103  04/15/16 0508  NA 146*  --  146*  K 5.2*  < > 3.5  CL 107  --  105  CO2 30  --  36*  BUN 54*  --  54*  CREATININE 2.16*  --  1.86*  CALCIUM  7.8*  --  7.6*  PROT 6.5  --   --   BILITOT 1.0  --   --   ALKPHOS 87  --   --   ALT 17  --   --   AST 33  --   --   GLUCOSE 133*  --  181*  < > = values in this interval not displayed. Lab Results  Component Value Date   CHOL 154 10/24/2014   HDL 40 10/24/2014   LDLCALC 94 10/24/2014   TRIG 98 10/24/2014   No results found for: DDIMER  Radiology/Studies:  Ct Head Wo Contrast  Result Date: 04/19/2016 CLINICAL DATA:  Patient to ER from home via [place] for c/o generalized weakness and BLE weakness x1 week. Altered mental status. EXAM: CT HEAD WITHOUT CONTRAST TECHNIQUE: Contiguous axial images were obtained from the base of the skull through the vertex without intravenous contrast. COMPARISON:  03/24/2014 FINDINGS: Brain: No evidence of acute infarction, hemorrhage, hydrocephalus, extra-axial collection or mass lesion/mass effect. Mild generalized cerebral atrophy and moderate chronic small vessel disease show no significant change. Vascular: No hyperdense vessel or unexpected calcification. Skull: Normal. Negative for fracture or focal lesion. Sinuses/Orbits: No acute finding. Other: None. IMPRESSION: No acute intracranial abnormality. 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 Electronically Signed   By: Earle Gell M.D.   On: 04/19/2016 14:03   Dg Chest Port 1 View  Result Date: 04/14/2016 CLINICAL DATA:  Central line placement, intubated EXAM: PORTABLE CHEST 1 VIEW COMPARISON:  04/12/2016 FINDINGS: Cardiomediastinal silhouette is stable.  Endotracheal tube in place with tip 5.7 cm above the carina. Right IJ central line with tip in distal SVC. No pneumothorax. Worsening infiltrate in left mid lung and left base. Aspiration pneumonia cannot be excluded. There is NG tube coiled within stomach with tip in proximal stomach. No pneumothorax. IMPRESSION: Endotracheal tube in place with tip 5.7 cm above the carina. Right IJ central line with tip in distal SVC. No pneumothorax. Worsening infiltrate in left mid lung and left base. Aspiration pneumonia cannot be excluded. There is NG tube coiled within stomach with tip in proximal stomach. No pneumothorax. Electronically Signed   By: Lahoma Crocker M.D.   On: 04/14/2016 09:55   Dg Chest Port 1 View  Result Date: 04/02/2016 CLINICAL DATA:  [place] Pall recently, fell 3 days ago and again today, generalized weakness, increased shortness of breath, increased oxygen requirements, COPD, coronary artery disease, chronic renal failure EXAM: PORTABLE CHEST 1 VIEW COMPARISON:  Portable exam 1402 hours compared to 11/24/2015 FINDINGS: Rotated to the RIGHT. Upper normal heart size. Atherosclerotic calcification aorta. Mediastinal contours and pulmonary vascularity normal. Interstitial infiltrates at the mid to lower lungs increased since prior study, could represent infection or edema. Mild RIGHT basilar atelectasis. Underlying emphysematous changes. No gross pleural effusion or pneumothorax. Bones demineralized. IMPRESSION: Emphysematous changes with RIGHT basilar atelectasis and mild interstitial infiltrates in mid to lower lungs which could represent mild edema or infection. Aortic atherosclerosis. Electronically Signed   By: Lavonia Dana M.D.   On: 04/09/2016 14:22   Dg Abd Portable 1v  Result Date: 04/14/2016 CLINICAL DATA:  OG tube placement. EXAM: PORTABLE ABDOMEN - 1 VIEW COMPARISON:  CT 02/04/2011. FINDINGS: A NG tube noted coiled stomach. Air-filled loops of small and large bowel noted with mild distention.  Adynamic ileus cannot be excluded. Follow-up abdominal series to demonstrate resolution suggested. Aortoiliac atherosclerotic vascular calcification. Degenerative changes lumbar spine . IMPRESSION: 1. OG tube noted coiled stomach.  Air-filled loops mildly distended small and large bowel noted. Mild adynamic ileus cannot be excluded. 2. Aortoiliac atherosclerotic vascular disease . Electronically Signed   By: Marcello Moores  Register   On: 04/14/2016 09:54    EKG: Interpreted by me showed: sinus tachycardia, 108 bpm, left anterior fascicular block, poor R wave progression, nonspecific st/t changes  Telemetry: Interpreted by me showed: NSR 70's bpm, occasional PVC  Weights: Filed Weights   04/15/2016 1323 04/07/2016 1731 04/15/16 0428  Weight: 138 lb (62.6 kg) 138 lb 12.8 oz (63 kg) 142 lb 3.2 oz (64.5 kg)     Physical Exam: Blood pressure (!) 92/52, pulse 87, temperature (!) 101 F (38.3 C), temperature source Oral, resp. rate (!) 24, height 5' 6"  (1.676 m), weight 142 lb 3.2 oz (64.5 kg), SpO2 94 %. Body mass index is 22.95 kg/m. General: Critically ill appearing, in no acute distress. Head: Normocephalic, atraumatic, sclera non-icteric, no xanthomas, nares are without discharge.  Neck: Negative for carotid bruits. JVD not elevated. Lungs: Decreased breath sounds bilaterallyi. Intubated.  Heart: RRR with S1 S2. No murmurs, rubs, or gallops appreciated. Abdomen: Soft, non-tender, non-distended with normoactive bowel sounds. No hepatomegaly. No rebound/guarding. No obvious abdominal masses. Msk:  Strength and tone appear normal for age. Extremities: No clubbing or cyanosis. No edema. Distal pedal pulses are 2+ and equal bilaterally. Neuro: Intubated and sedated. Psych:  Intubated and sedated.    Assessment and Plan:  Principal Problem:   Community acquired pneumonia of right lung Active Problems:   Acute metabolic encephalopathy   Acute on chronic respiratory failure with hypoxia ([place])    1.  Acute on chronic respiratory failure with hypoxia and hypercapnia: -Likely multifactorial including PNA, AECOPD, pulmonary edema and acute on chronic diastolic CHF -Intubated per PCCM -Wean vent support as able  2. Acute on chronic diastolic CHF: -Cannot add IV Lasix at this time given hypotension with SBP in the 90's mmHg -HF medications on hold given septic shock, restart as able -Restart diuresis as able -Echo as above -Follow up CXR -Monitor CVP -May need gentle diuresis  3. Septic shock in the setting of PNA and AECOPD: -ABX per IM -Levophed to maintain a MAP > 65 mmHg -Nebs  4. Elevated troponin/CAD: -Minimally elevated and flat trending -Likely in the setting of his acute on chronic respiratory failure, septic shock and acute renal failure -Echo with normal EF -Consider outpatient stress testing once he is over his acute illness -ASA -Not on beta blocker given #1 and #3  5. Acute renal failure: -Likely in the setting of his infection and septic shock -Gentle IV fluids -Improving -Hyperkalemia, resolved  6. Anemia of chronic disease: -Stable    Signed, Christell Faith, PA-C [facility] Pager: [phone] 04/15/2016, 7:27 AM

## 2016-04-15 NOTE — Progress Notes (Signed)
ANTICOAGULATION CONSULT NOTE  Pharmacy Consult for heparin drip management Indication: DVT   Pharmacy consulted for heparin drip management for 75 yo male admitted to ICU for mechanic ventilation and septic shock. Patient initiated on heparin drip for possible DVT.    Plan:  Patient previously on heparin 5000 units SQ Q8hr - last administered dose 10/20 0546. Will initiated patient on heparin 1000 units/hr with no bolus. Will check anti-Xa level at 1900.     No Known Allergies  Patient Measurements: Height: 5\' 6"  (167.6 cm) Weight: 142 lb 3.2 oz (64.5 kg) IBW/kg (Calculated) : 63.8   Vital Signs: Temp: 98 F (36.7 C) (10/20 1200) Temp Source: Axillary (10/20 1200) BP: 90/41 (10/20 1500) Pulse Rate: 72 (10/20 1500)  Labs:  Recent Labs  Nov 03, 2015 1332 Nov 03, 2015 1827 04/14/16 0504 04/14/16 1103 04/14/16 1713 04/15/16 0508 04/15/16 1108  HGB 12.2*  --   --  11.4*  --   --  10.4*  HCT 39.4*  --   --  39.2*  --   --  33.6*  PLT 101*  --   --  113*  --   --  87*  APTT 29  --   --   --   --   --   --   LABPROT 13.7  --   --   --   --   --  16.2*  INR 1.05  --   --   --   --   --  1.29  HEPARINUNFRC  --   --   --   --   --   --  <0.10*  CREATININE 1.58* 1.45* 1.90* 2.16*  --  1.86*  --   CKTOTAL  --   --   --  226  --   --   --   TROPONINI 0.04* 0.05*  --  0.05* 0.11*  --   --     Estimated Creatinine Clearance: 31.4 mL/min (by C-G formula based on SCr of 1.86 mg/dL (H)).  Pharmacy will continue to monitor and adjust per consult.    Simpson,Michael L 04/15/2016,3:44 PM

## 2016-04-15 NOTE — Progress Notes (Signed)
Pharmacy Progress Note  Keith Watts is a 75 y.o. male admitted on 04/19/2016 with pneumonia. Patient not responding to sternal rub 10/18 transferred to ICU and subsequently intubated and sedated, requiring pressors.   Plan: 1. Antibiotics: Continue piperacillin/tazobactam EI 3.375g IV Q8hr.    2. Electrolytes: No replacement warranted at this time. Will recheck electrolytes with am labs.    3. Constipation Prevention: Patient currently on fentanyl drip. Will initiate patient on senna/docusate 1 tab bid.    Height: 5\' 6"  (167.6 cm) Weight: 142 lb 3.2 oz (64.5 kg) IBW/kg (Calculated) : 63.8  Temp (24hrs), Avg:99.2 F (37.3 C), Min:97.2 F (36.2 C), Max:101 F (38.3 C)   Recent Labs Lab 03/28/2016 1332 04/05/2016 1827 04/14/16 0504 04/14/16 1103 04/14/16 1713 04/15/16 0508 04/15/16 1108  WBC 8.3  --   --  13.0*  --   --  12.6*  CREATININE 1.58* 1.45* 1.90* 2.16*  --  1.86*  --   LATICACIDVEN 1.9  --   --  2.3* 1.1  --   --     Estimated Creatinine Clearance: 31.4 mL/min (by C-G formula based on SCr of 1.86 mg/dL (H)).    No Known Allergies  Antimicrobials this admission: ceftriaxone 10/18 >>  10/19 azithromycin 10/18 >>  10/19 Vancomycin 10/19 >> 10/19   Zosyn 10/19 >>  Dose adjustments this admission:  Microbiology results: 10/19: MRSA PCR: negative 10/18 BCx: NGTD x 4 10/18 UCx: no growth  10/18 Sputum: reincubated for better growth   Pharmacy will continue to monitor and adjust per consult.   Simpson,Michael L 04/15/2016 3:48 PM

## 2016-04-16 ENCOUNTER — Inpatient Hospital Stay: Payer: Medicare Other

## 2016-04-16 DIAGNOSIS — I2699 Other pulmonary embolism without acute cor pulmonale: Secondary | ICD-10-CM

## 2016-04-16 DIAGNOSIS — N179 Acute kidney failure, unspecified: Secondary | ICD-10-CM

## 2016-04-16 DIAGNOSIS — I5033 Acute on chronic diastolic (congestive) heart failure: Secondary | ICD-10-CM

## 2016-04-16 DIAGNOSIS — G252 Other specified forms of tremor: Secondary | ICD-10-CM

## 2016-04-16 DIAGNOSIS — I5031 Acute diastolic (congestive) heart failure: Secondary | ICD-10-CM

## 2016-04-16 LAB — CBC
HEMATOCRIT: 32.5 % — AB (ref 40.0–52.0)
Hemoglobin: 10.5 g/dL — ABNORMAL LOW (ref 13.0–18.0)
MCH: 28.3 pg (ref 26.0–34.0)
MCHC: 32.3 g/dL (ref 32.0–36.0)
MCV: 87.7 fL (ref 80.0–100.0)
PLATELETS: 85 10*3/uL — AB (ref 150–440)
RBC: 3.71 MIL/uL — ABNORMAL LOW (ref 4.40–5.90)
RDW: 15.9 % — AB (ref 11.5–14.5)
WBC: 10 10*3/uL (ref 3.8–10.6)

## 2016-04-16 LAB — GLUCOSE, CAPILLARY
GLUCOSE-CAPILLARY: 147 mg/dL — AB (ref 65–99)
GLUCOSE-CAPILLARY: 161 mg/dL — AB (ref 65–99)
GLUCOSE-CAPILLARY: 153 mg/dL — AB (ref 65–99)
Glucose-Capillary: 127 mg/dL — ABNORMAL HIGH (ref 65–99)
Glucose-Capillary: 141 mg/dL — ABNORMAL HIGH (ref 65–99)

## 2016-04-16 LAB — BASIC METABOLIC PANEL
ANION GAP: 4 — AB (ref 5–15)
BUN: 45 mg/dL — ABNORMAL HIGH (ref 6–20)
CALCIUM: 7.7 mg/dL — AB (ref 8.9–10.3)
CO2: 35 mmol/L — ABNORMAL HIGH (ref 22–32)
Chloride: 106 mmol/L (ref 101–111)
Creatinine, Ser: 1.53 mg/dL — ABNORMAL HIGH (ref 0.61–1.24)
GFR, EST AFRICAN AMERICAN: 50 mL/min — AB (ref >60)
GFR, EST NON AFRICAN AMERICAN: 43 mL/min — AB (ref >60)
Glucose, Bld: 132 mg/dL — ABNORMAL HIGH (ref 65–99)
Potassium: 4.1 mmol/L (ref 3.5–5.1)
SODIUM: 145 mmol/L (ref 135–145)

## 2016-04-16 LAB — CULTURE, RESPIRATORY: CULTURE: NORMAL

## 2016-04-16 LAB — PROCALCITONIN: PROCALCITONIN: 0.38 ng/mL

## 2016-04-16 LAB — CORTISOL-AM, BLOOD: CORTISOL - AM: 22.2 ug/dL (ref 6.7–22.6)

## 2016-04-16 LAB — HEPARIN LEVEL (UNFRACTIONATED)
Heparin Unfractionated: 0.81 IU/mL — ABNORMAL HIGH (ref 0.30–0.70)
Heparin Unfractionated: 0.63 IU/mL (ref 0.30–0.70)

## 2016-04-16 LAB — CULTURE, RESPIRATORY W GRAM STAIN: Special Requests: NORMAL

## 2016-04-16 LAB — POTASSIUM: Potassium: 3.2 mmol/L — ABNORMAL LOW (ref 3.5–5.1)

## 2016-04-16 MED ORDER — POTASSIUM CHLORIDE 10 MEQ/50ML IV SOLN
10.0000 meq | INTRAVENOUS | Status: AC
  Administered 2016-04-16 (×4): 10 meq via INTRAVENOUS
  Filled 2016-04-16 (×4): qty 50

## 2016-04-16 MED ORDER — FUROSEMIDE 10 MG/ML IJ SOLN
INTRAMUSCULAR | Status: AC
  Administered 2016-04-16: 13:00:00
  Filled 2016-04-16: qty 2

## 2016-04-16 MED ORDER — ACETYLCYSTEINE 20 % IN SOLN
3.0000 mL | Freq: Four times a day (QID) | RESPIRATORY_TRACT | Status: DC
  Administered 2016-04-16 – 2016-04-17 (×4): 3 mL via RESPIRATORY_TRACT
  Administered 2016-04-17: 4 mL via RESPIRATORY_TRACT
  Administered 2016-04-17: 08:00:00 via RESPIRATORY_TRACT
  Administered 2016-04-18 (×2): 3 mL via RESPIRATORY_TRACT
  Filled 2016-04-16 (×8): qty 4

## 2016-04-16 MED ORDER — FUROSEMIDE 10 MG/ML IJ SOLN
20.0000 mg | Freq: Once | INTRAMUSCULAR | Status: AC
  Administered 2016-04-16: 20 mg via INTRAVENOUS

## 2016-04-16 MED ORDER — SODIUM CHLORIDE 0.9 % IV SOLN
500.0000 mg | Freq: Two times a day (BID) | INTRAVENOUS | Status: DC
  Administered 2016-04-16 – 2016-04-20 (×9): 500 mg via INTRAVENOUS
  Filled 2016-04-16 (×10): qty 5

## 2016-04-16 MED ORDER — SODIUM CHLORIDE 0.9 % IV SOLN
1.0000 mg/h | INTRAVENOUS | Status: DC
  Administered 2016-04-16 – 2016-04-18 (×2): 1 mg/h via INTRAVENOUS
  Filled 2016-04-16 (×2): qty 10

## 2016-04-16 NOTE — Consult Note (Signed)
Reason for Consult:tremors Referring Physician: Dr. Nicholos Johnsamachandran  CC: tremors  HPI: Keith Watts is an 75 y.o. male admitted 10/18 with increased weakness, cough, poor PO intake. Pt was intubated on 10/19 for PNA and hypercapnic respiratory failure.   Currently intubated and is on Fentanyl 150/hr Neurology consulted for tremors in upper and lower extremities that is intermittent.  As per family who are at bedside. Pt does have intermitted tremors.  He was started on Keppra at 500 BID   Past Medical History:  Diagnosis Date  . Allergic rhinitis   . BPH (benign prostatic hyperplasia)   . CAD (coronary artery disease)    a. 1 vessel CAD by cardiac cath in 2010 managed medically; b. cath 11/2014 mLAD 30%, mLCx 99% s/p PCI/DES 0%, mRCA 40%, mod calcified coronaries, nl LVSF, mod elevated LVEDP, mild AS  . Chicken pox   . Chronic respiratory failure (HCC)    a. on 3L O2 via nasal cannula  . COPD (chronic obstructive pulmonary disease) (HCC)   . Diastolic dysfunction    a. echo 09/2014: EF 50-55%, impaired relaxation of LV diastolic filling, nl RV size & sys fxn, mildly dilated LA, mild MR, mild Ao stenosis, nl PASP  . Measles   . Mumps   . Pneumonia    a. admission to [facility] 09/2014 & 11/2014  . Vitamin D deficiency     Past Surgical History:  Procedure Laterality Date  . APPENDECTOMY    . CARDIAC CATHETERIZATION N/A 11/28/2014   Procedure: Left Heart Cath and Coronary Angiography;  Surgeon: Iran OuchMuhammad A Arida, MD;  Location: [facility] INVASIVE CV LAB;  Service: Cardiovascular;  Laterality: N/A;  please schedule for lunch time with Dr. Kirke CorinArida, MD  . CARDIAC CATHETERIZATION N/A 11/28/2014   Procedure: Coronary Stent Intervention;  Surgeon: Iran OuchMuhammad A Arida, MD;  Location: [facility] INVASIVE CV LAB;  Service: Cardiovascular;  Laterality: N/A;  . EYE SURGERY    . TONSILLECTOMY      Family History  Problem Relation Age of Onset  . Cancer - Lung Mother   . Cancer - Colon Father   . Colon cancer Father    . Liver cancer Brother   . Pancreatic cancer Brother   . Prostate cancer Brother   . Liver cancer Brother     Social History:  reports that he quit smoking about 7 years ago. He has a 100.00 pack-year smoking history. He has never used smokeless tobacco. He reports that he does not drink alcohol or use drugs.  No Known Allergies  Medications: I have reviewed the patient's current medications.  ROS: Unable to obtain as sedated   Physical Examination: Blood pressure (!) 108/92, pulse (!) 101, temperature 99.5 F (37.5 C), temperature source Axillary, resp. rate (!) 24, height 5\' 6"  (1.676 m), weight 66.3 kg (146 lb 2.6 oz), SpO2 92 %.  Intubated Does not follow commands EOM intact Withdraws from painful stimuli bilaterally   Laboratory Studies:   Basic Metabolic Panel:  Recent Labs Lab 04/14/16 0504 04/14/16 1103  04/15/16 0508 04/15/16 1736 04/15/16 2042 04/15/16 2350 04/16/16 0454  NA 147* 146*  --  146* 147*  --   --  145  K 6.1* 5.2*  < > 3.5 3.1* 3.1* 3.2* 4.1  CL 104 107  --  105 105  --   --  106  CO2 37* 30  --  36* 36*  --   --  35*  GLUCOSE 101* 133*  --  181* 109*  --   --  132*  BUN 55* 54*  --  54* 50*  --   --  45*  CREATININE 1.90* 2.16*  --  1.86* 1.78*  --   --  1.53*  CALCIUM 8.0* 7.8*  --  7.6* 7.7*  --   --  7.7*  MG  --  2.3  --  2.0 2.1  --   --   --   PHOS  --  5.8*  --  3.6  --   --   --   --   < > = values in this interval not displayed.  Liver Function Tests:  Recent Labs Lab 04/27/2016 1332 04/14/16 1103  AST 29 33  ALT 19 17  ALKPHOS 85 87  BILITOT 0.8 1.0  PROT 7.6 6.5  ALBUMIN 3.3* 2.9*    Recent Labs Lab 04/27/2016 1332  LIPASE 45   No results for input(s): AMMONIA in the last 168 hours.  CBC:  Recent Labs Lab 04/27/16 1332 04/14/16 1103 04/15/16 1108 04/16/16 0454  WBC 8.3 13.0* 12.6* 10.0  NEUTROABS 7.5* 11.4*  --   --   HGB 12.2* 11.4* 10.4* 10.5*  HCT 39.4* 39.2* 33.6* 32.5*  MCV 89.6 94.7 88.7 87.7  PLT  101* 113* 87* 85*    Cardiac Enzymes:  Recent Labs Lab 04/27/16 1332 04-27-2016 1827 04/14/16 1103 04/14/16 1713  CKTOTAL  --   --  226  --   TROPONINI 0.04* 0.05* 0.05* 0.11*    BNP: Invalid input(s): POCBNP  CBG:  Recent Labs Lab 04/15/16 1936 04/15/16 2343 04/16/16 0346 04/16/16 0733 04/16/16 1107  GLUCAP 107* 138* 127* 147* 141*    Microbiology: Results for orders placed or performed during the hospital encounter of 2016-04-27  Blood Culture (routine x 2)     Status: None (Preliminary result)   Collection Time: 27-Apr-2016  1:31 PM  Result Value Ref Range Status   Specimen Description BLOOD LEFT HAND  Final   Special Requests   Final    BOTTLES DRAWN AEROBIC AND ANAEROBIC AER ANA   Culture NO GROWTH 3 DAYS  Final   Report Status PENDING  Incomplete  Blood Culture (routine x 2)     Status: None (Preliminary result)   Collection Time: 04-27-16  1:32 PM  Result Value Ref Range Status   Specimen Description BLOOD RIGHT AC  Final   Special Requests   Final    BOTTLES DRAWN AEROBIC AND ANAEROBIC AER ANA   Culture NO GROWTH 3 DAYS  Final   Report Status PENDING  Incomplete  Culture, blood (routine x 2) Call MD if unable to obtain prior to antibiotics being given     Status: None (Preliminary result)   Collection Time: 04/27/16  6:27 PM  Result Value Ref Range Status   Specimen Description BLOOD RIGHT HAND  Final   Special Requests   Final    BOTTLES DRAWN AEROBIC AND ANAEROBIC 10CCAERO,10CCANA   Culture NO GROWTH 3 DAYS  Final   Report Status PENDING  Incomplete  Culture, blood (routine x 2) Call MD if unable to obtain prior to antibiotics being given     Status: None (Preliminary result)   Collection Time: 04-27-16  6:37 PM  Result Value Ref Range Status   Specimen Description BLOOD LEFT HAND  Final   Special Requests BOTTLES DRAWN AEROBIC AND ANAEROBIC 8CCAERO,8CCANA  Final   Culture NO GROWTH 3 DAYS  Final   Report Status PENDING  Incomplete   MRSA PCR  Screening     Status: None   Collection Time: 04/14/16  9:27 AM  Result Value Ref Range Status   MRSA by PCR NEGATIVE NEGATIVE Final    Comment:        The GeneXpert MRSA Assay (FDA approved for NASAL specimens only), is one component of a comprehensive MRSA colonization surveillance program. It is not intended to diagnose MRSA infection nor to guide or monitor treatment for MRSA infections.   Culture, respiratory (NON-Expectorated)     Status: None   Collection Time: 04/14/16 10:02 AM  Result Value Ref Range Status   Specimen Description TRACHEAL ASPIRATE  Final   Special Requests Normal  Final   Gram Stain   Final    MODERATE WBC PRESENT, PREDOMINANTLY PMN FEW SQUAMOUS EPITHELIAL CELLS PRESENT MODERATE GRAM POSITIVE COCCI IN PAIRS AND CHAINS FEW GRAM POSITIVE COCCI IN CLUSTERS FEW GRAM NEGATIVE RODS FEW GRAM POSITIVE RODS RARE YEAST    Culture   Final    Consistent with normal respiratory flora. Performed at [facility]    Report Status 04/16/2016 FINAL  Final  Urine culture     Status: None   Collection Time: 04/14/16 10:47 AM  Result Value Ref Range Status   Specimen Description URINE, RANDOM  Final   Special Requests NONE  Final   Culture   Final    NO GROWTH 1 DAY Performed at [facility]    Report Status 04/15/2016 FINAL  Final    Coagulation Studies:  Recent Labs  04/15/16 1108  LABPROT 16.2*  INR 1.29    Urinalysis:  Recent Labs Lab 04/14/16 1047  COLORURINE YELLOW*  LABSPEC 1.016  PHURINE 5.0  GLUCOSEU NEGATIVE  HGBUR NEGATIVE  BILIRUBINUR NEGATIVE  KETONESUR NEGATIVE  PROTEINUR 30*  NITRITE NEGATIVE  LEUKOCYTESUR NEGATIVE    Lipid Panel:     Component Value Date/Time   CHOL 154 10/24/2014 0755   TRIG 98 10/24/2014 0755   HDL 40 10/24/2014 0755   VLDL 20 10/24/2014 0755   LDLCALC 94 10/24/2014 0755    HgbA1C: No results found for: HGBA1C  Urine Drug Screen:  No results found for: LABOPIA,  COCAINSCRNUR, LABBENZ, AMPHETMU, THCU, LABBARB  Alcohol Level: No results for input(s): ETH in the last 168 hours.  Other results: EKG: normal EKG, normal sinus rhythm, unchanged from previous tracings.  Imaging: Dg Chest 1 View  Result Date: 04/16/2016 CLINICAL DATA:  75 year old male with shortness of breath EXAM: CHEST 1 VIEW COMPARISON:  Chest radiograph dated 04/14/2016 FINDINGS: Endotracheal tube above the carina. Right IJ central line with tip over central SVC. An enteric tube in proximal stomach in stable positioning. 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 There has been interval improvement of the aeration of the left lung base. Right lung base streaky densities appear similar to prior study. A small left pleural effusion may be present. There is no pneumothorax. The cardiac silhouette is within normal limits. No acute osseous pathology. IMPRESSION: Support lines and tubes in stable positioning. Interval improvement of aeration of the left lung base without complete resolution. Follow-up recommended. Trace left pleural effusion may be present. Right lung base densities appear similar to prior radiograph. Electronically Signed   By: Elgie Collard M.D.   On: 04/16/2016 04:38   Ct Head Wo Contrast  Result Date: 04/15/2016 CLINICAL DATA:  Altered mental status.  Possible seizure activity. EXAM: CT HEAD WITHOUT CONTRAST TECHNIQUE: Contiguous axial images were obtained from the  base of the skull through the vertex without intravenous contrast. COMPARISON:  04/06/2016 FINDINGS: Brain: No evidence of acute infarction, hemorrhage, hydrocephalus, extra-axial collection or mass lesion/mass effect. Mild cerebral atrophy is stable. Mild to moderate chronic small vessel disease unchanged in appearance. Vascular: No hyperdense vessel or unexpected calcification. Skull: Normal. Negative for fracture or focal lesion. Sinuses/Orbits: No acute  finding. Other: None. IMPRESSION: No acute intracranial abnormality. 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 Electronically Signed   By: Myles Rosenthal M.D.   On: 04/15/2016 17:13   Dg Chest Port 1 View  Result Date: 04/16/2016 CLINICAL DATA:  Decreased breath sounds EXAM: PORTABLE CHEST 1 VIEW COMPARISON:  04/16/2016 FINDINGS: Cardiac shadow is stable. Endotracheal tube, nasogastric catheter and right jugular central line are again seen and stable. Stable streaky opacities are noted in the right lung base. Small left pleural effusion is seen as well as some retrocardiac consolidation. No acute bony abnormality is noted. IMPRESSION: Stable appearance of the chest when compared with the prior exam. Electronically Signed   By: Alcide Clever M.D.   On: 04/16/2016 13:15     Assessment/Plan:  75 y.o. male admitted 10/18 with increased weakness, cough, poor PO intake. Pt was intubated on 10/19 for PNA and hypercapnic respiratory failure.   Currently intubated and is on Fentanyl 150/hr Neurology consulted for tremors in upper and lower extremities that is intermittent.  As per family who are at bedside. Pt does have intermitted tremors.  He was started on Keppra at 500 BID   At this time do not think pt is having seizures and he is not ready to be extubated.   Tremors are not rhythmic and not continuous  - Con't Keppra - Will start low dose versed as pt is not ready to be extubated since no EEG available.  - If worsen or suspected seizure would nee to transfer to [facility] where there is continuous EEG capability.    04/16/2016, 3:59 PM

## 2016-04-16 NOTE — Progress Notes (Addendum)
ANTICOAGULATION CONSULT NOTE - Follow Up Consult  Pharmacy Consult for heparin drip monitoring Indication: DVT  No Known Allergies  Patient Measurements: Height: _0  (167.6 cm) Weight: 146 lb 2.6 oz (66.3 kg) IBW/kg (Calculated) : 63.8 Heparin Dosing Weight: 63 kg  Vital Signs: Temp: 99.9 F (37.7 C) (10/21 1900) Temp Source: Axillary (10/21 1900) BP: 103/52 (10/21 1900) Pulse Rate: 117 (10/21 1900)  Labs:  Recent Labs  04/14/16 1103 04/14/16 1713 04/15/16 0508  04/15/16 1108 04/15/16 1736 04/15/16 2042 04/16/16 0454 04/16/16 1806  HGB 11.4*  --   --   --  10.4*  --   --  10.5*  --   HCT 39.2*  --   --   --  33.6*  --   --  32.5*  --   PLT 113*  --   --   --  87*  --   --  85*  --   LABPROT  --   --   --   --  16.2*  --   --   --   --   INR  --   --   --   --  1.29  --   --   --   --   HEPARINUNFRC  --   --   --   < > <0.10*  --  0.56 0.81* 0.63  CREATININE 2.16*  --  1.86*  --   --  1.78*  --  1.53*  --   CKTOTAL 226  --   --   --   --   --   --   --   --   TROPONINI 0.05* 0.11*  --   --   --   --   --   --   --   < > = values in this interval not displayed.  Estimated Creatinine Clearance: 38.2 mL/min (by C-G formula based on SCr of 1.53 mg/dL (H)).   Medications:  Scheduled:  . acetylcysteine  3 mL Nebulization Q6H  . aspirin  81 mg Oral Daily  . chlorhexidine gluconate (MEDLINE KIT)  15 mL Mouth Rinse BID  . famotidine  20 mg Oral QHS  . finasteride  5 mg Oral QHS  . insulin aspart  0-15 Units Subcutaneous Q4H  . ipratropium-albuterol  3 mL Nebulization Q6H  . levETIRAcetam  500 mg Intravenous Q12H  . mouth rinse  15 mL Mouth Rinse 10 times per day  . piperacillin-tazobactam (ZOSYN)  IV  3.375 g Intravenous Q8H  . senna-docusate  1 tablet Oral BID  . sodium chloride flush  10-40 mL Intracatheter Q12H    Assessment: Pharmacy is consulted to dose and monitor heparin drip in this 75 year old male for possible DVT. Patient was not taking anticoagulants  prior to admission, but was on heparin subcutaneously q8h for DVT prophylaxis.   Goal of Therapy:  Heparin level 0.3-0.7 units/ml Monitor platelets by anticoagulation protocol: Yes   Plan:  10/20 @ 2042, HL = 0.56 which is therapeutic. Continue heparin drip at current rate of 1000 units/hr. Will check confirmatory heparin level in 8 hours and CBC with AM labs tomorrow.  10/21 @ HL= 0.81. Will decrease heparin gtt to 900 units/hr. Will recheck Heparin @ 18:00.   10/21 HL @ 18:00 = 0.63.  Will continue this pt on current rate of 900 units/hr and recheck HL on 10/22 @ 0200.   19/22 02:00 heparin level 0.53. Continue current regimen. Recheck heparin level and CBC  with tomorrow AM labs.  Orene Desanctis, PharmD Clinical Pharmacist 04/16/2016,7:39 PM

## 2016-04-16 NOTE — Progress Notes (Signed)
Pharmacy Progress Note  Keith Watts is a 75 y.o. male admitted on 04/22/2016 with pneumonia. Patient not responding to sternal rub 10/18 transferred to ICU and subsequently intubated and sedated, requiring pressors.   Plan: 1. Antibiotics: Continue piperacillin/tazobactam EI 3.375g IV Q8hr.    2. Electrolytes: No replacement warranted at this time. Will recheck electrolytes with am labs.    3. Constipation Prevention: Patient currently on fentanyl drip. Will continue patient on senna/docusate 1 tab bid.    Height: 5\' 6"  (167.6 cm) Weight: 146 lb 2.6 oz (66.3 kg) IBW/kg (Calculated) : 63.8  Temp (24hrs), Avg:98.9 F (37.2 C), Min:97.6 F (36.4 C), Max:100 F (37.8 C)   Recent Labs Lab 04/07/2016 1332  04/14/16 0504 04/14/16 1103 04/14/16 1713 04/15/16 0508 04/15/16 1108 04/15/16 1736 04/16/16 0454  WBC 8.3  --   --  13.0*  --   --  12.6*  --  10.0  CREATININE 1.58*  < > 1.90* 2.16*  --  1.86*  --  1.78* 1.53*  LATICACIDVEN 1.9  --   --  2.3* 1.1  --   --   --   --   < > = values in this interval not displayed.  Estimated Creatinine Clearance: 38.2 mL/min (by C-G formula based on SCr of 1.53 mg/dL (H)).    No Known Allergies  Antimicrobials this admission: ceftriaxone 10/18 >>  10/19 azithromycin 10/18 >>  10/19 Vancomycin 10/19 >> 10/19   Zosyn 10/19 >>  Dose adjustments this admission:  Microbiology results: 10/19: MRSA PCR: negative 10/18 BCx: NGTD x 4 10/18 UCx: no growth  10/18 Sputum: reincubated for better growth   Pharmacy will continue to monitor and adjust per consult.   Antawn Sison D 04/16/2016 9:41 AM

## 2016-04-16 NOTE — Progress Notes (Signed)
Patient began having intermittent twitching of bilateral arms and legs. Patient is not febrile, pupils are 2mm and reactive. Attempted increasing fentanyl, twitching decreased but still present. Noticed twitching in eyelids and paged NP Sonda Rumbleana Blakeney to assess. NP recommended STAT CT of head and EEG to rule out seizures. Heparin d/c'd temporarily until results of CT are available. Will continue to monitor patient.

## 2016-04-16 NOTE — Progress Notes (Signed)
ANTICOAGULATION CONSULT NOTE - Follow Up Consult  Pharmacy Consult for heparin drip monitoring Indication: DVT  No Known Allergies  Patient Measurements: Height: 5' 6"  (167.6 cm) Weight: 146 lb 2.6 oz (66.3 kg) IBW/kg (Calculated) : 63.8 Heparin Dosing Weight: 63 kg  Vital Signs: Temp: 99.4 F (37.4 C) (10/21 0800) Temp Source: Axillary (10/21 0800) BP: 106/48 (10/21 0915) Pulse Rate: 107 (10/21 0915)  Labs:  Recent Labs  04/21/2016 1332 04/14/2016 1827  04/14/16 1103 04/14/16 1713 04/15/16 0508 04/15/16 1108 04/15/16 1736 04/15/16 2042 04/16/16 0454  HGB 12.2*  --   --  11.4*  --   --  10.4*  --   --  10.5*  HCT 39.4*  --   --  39.2*  --   --  33.6*  --   --  32.5*  PLT 101*  --   --  113*  --   --  87*  --   --  85*  APTT 29  --   --   --   --   --   --   --   --   --   LABPROT 13.7  --   --   --   --   --  16.2*  --   --   --   INR 1.05  --   --   --   --   --  1.29  --   --   --   HEPARINUNFRC  --   --   --   --   --   --  <0.10*  --  0.56 0.81*  CREATININE 1.58* 1.45*  < > 2.16*  --  1.86*  --  1.78*  --  1.53*  CKTOTAL  --   --   --  226  --   --   --   --   --   --   TROPONINI 0.04* 0.05*  --  0.05* 0.11*  --   --   --   --   --   < > = values in this interval not displayed.  Estimated Creatinine Clearance: 38.2 mL/min (by C-G formula based on SCr of 1.53 mg/dL (H)).   Medications:  Scheduled:  . aspirin  81 mg Oral Daily  . budesonide (PULMICORT) nebulizer solution  0.5 mg Nebulization BID  . chlorhexidine gluconate (MEDLINE KIT)  15 mL Mouth Rinse BID  . famotidine  20 mg Oral QHS  . finasteride  5 mg Oral QHS  . insulin aspart  0-15 Units Subcutaneous Q4H  . ipratropium-albuterol  3 mL Nebulization Q6H  . levETIRAcetam  500 mg Intravenous Q12H  . mouth rinse  15 mL Mouth Rinse 10 times per day  . piperacillin-tazobactam (ZOSYN)  IV  3.375 g Intravenous Q8H  . senna-docusate  1 tablet Oral BID  . sodium chloride flush  10-40 mL Intracatheter Q12H     Assessment: Pharmacy is consulted to dose and monitor heparin drip in this 75 year old male for possible DVT. Patient was not taking anticoagulants prior to admission, but was on heparin subcutaneously q8h for DVT prophylaxis.   Goal of Therapy:  Heparin level 0.3-0.7 units/ml Monitor platelets by anticoagulation protocol: Yes   Plan:  10/20 @ 2042, HL = 0.56 which is therapeutic. Continue heparin drip at current rate of 1000 units/hr. Will check confirmatory heparin level in 8 hours and CBC with AM labs tomorrow.  10/21 @ HL= 0.81. Will decrease heparin gtt to 900 units/hr.  Will recheck Heparin @ 18:00.   Larene Beach, PharmD Clinical Pharmacist 04/16/2016,9:37 AM

## 2016-04-16 NOTE — Progress Notes (Signed)
PULMONARY / CRITICAL CARE MEDICINE   Name: Keith Watts MRN: [id number] DOB: [birth year]    ADMISSION DATE:  04/09/2016 CONSULTATION DATE:  04/14/2016  REFERRING MD:  Dr. Elisabeth Pigeon  CHIEF COMPLAINT:  Weakness  Patient profile: This is a 75 yo male with a PMH of Pneumonia, Diastolic dysfunction (Echo 09/2014 EF 50-55%), COPD, Former smoker (1 to 1.5 PPD for 40 yrs.), Chronic O2 3L via nasal cannula, CAD, PCI 2016, BPH, and Allergic Rhinitis.  He presented to [facility] ER on 10/18 and admitted to the [facility] unit. On 10/19  rapid response was initiated .Upon rapid response nurses arrival it was noted the pt was unreesponsive to sternal rub and was hypotensive, therefore pt was transferred to ICU and intubated.  PCCM consulted for acute on chronic hypoxic hypercapnic respiratory failure secondary to bilateral lower lobe pneumonia and AECOPD, pulmonary edema requiring mechanical intubation and septic shock requiring pressors.  Subjective: Patient is moderately sedated on the ventilator. He appears to have twitching movements of the upper and lower extremeties. No fever.CT head negative and Keppra initiated for possible seizures. However, per patient's wife, patient has mild baseline tremors.  VITAL SIGNS: BP (!) 90/49   Pulse 80   Temp 99.5 F (37.5 C) (Axillary)   Resp (!) 24   Ht 5\' 6"  (1.676 m)   Wt 146 lb 2.6 oz (66.3 kg)   SpO2 96%   BMI 23.59 kg/m   HEMODYNAMICS: CVP:  [8 mmHg-14 mmHg] 13 mmHg  VENTILATOR SETTINGS: Vent Mode: PRVC FiO2 (%):  [40 %-60 %] 60 % Set Rate:  [24 bmp] 24 bmp Vt Set:  [450 mL] 450 mL PEEP:  [5 cmH20] 5 cmH20 Plateau Pressure:  [15 cmH20-19 cmH20] 16 cmH20  INTAKE / OUTPUT: I/O last 3 completed shifts: In: 2269 [I.V.:935; NG/GT:974; IV Piggyback:360] Out: 2300 [Urine:2300]  PHYSICAL EXAMINATION: General:  Chronically ill appearing Caucasian male Neuro:  Sedated on ventilator, PERRL, does not follow commands HEENT:  Supple, no  JVD Cardiovascular:  Sinus tachycardia, s1s2, no M/R/G Lungs:  Diminished throughout, even, non labored Abdomen:  Hypoactive BS x4, soft, non tender, non distended Musculoskeletal:  Normal bulk and tone Skin:  Intact no rashes or lesions  LABS:  BMET  Recent Labs Lab 04/15/16 0508 04/15/16 1736 04/15/16 2042 04/15/16 2350 04/16/16 0454  NA 146* 147*  --   --  145  K 3.5 3.1* 3.1* 3.2* 4.1  CL 105 105  --   --  106  CO2 36* 36*  --   --  35*  BUN 54* 50*  --   --  45*  CREATININE 1.86* 1.78*  --   --  1.53*  GLUCOSE 181* 109*  --   --  132*    Electrolytes  Recent Labs Lab 04/14/16 1103 04/15/16 0508 04/15/16 1736 04/16/16 0454  CALCIUM 7.8* 7.6* 7.7* 7.7*  MG 2.3 2.0 2.1  --   PHOS 5.8* 3.6  --   --     CBC  Recent Labs Lab 04/14/16 1103 04/15/16 1108 04/16/16 0454  WBC 13.0* 12.6* 10.0  HGB 11.4* 10.4* 10.5*  HCT 39.2* 33.6* 32.5*  PLT 113* 87* 85*    Coag's  Recent Labs Lab 04/06/2016 1332 04/15/16 1108  APTT 29  --   INR 1.05 1.29    Sepsis Markers  Recent Labs Lab 04/03/2016 1332 04/14/16 1103 04/14/16 1713 04/15/16 0508 04/16/16 0454  LATICACIDVEN 1.9 2.3* 1.1  --   --   PROCALCITON  --  0.16  --  0.34 0.38    ABG  Recent Labs Lab 04/14/16 1447 04/14/16 1756 04/15/16 0412  PHART 7.31* 7.34* 7.31*  PCO2ART 78* 76* 80*  PO2ART 53* 56* 84    Liver Enzymes  Recent Labs Lab 08-13-15 1332 04/14/16 1103  AST 29 33  ALT 19 17  ALKPHOS 85 87  BILITOT 0.8 1.0  ALBUMIN 3.3* 2.9*    Cardiac Enzymes  Recent Labs Lab 08-13-15 1827 04/14/16 1103 04/14/16 1713  TROPONINI 0.05* 0.05* 0.11*    Glucose  Recent Labs Lab 04/15/16 0739 04/15/16 1104 04/15/16 1539 04/15/16 1936 04/15/16 2343 04/16/16 0346  GLUCAP 165* 152* 121* 107* 138* 127*    Imaging Dg Chest 1 View  Result Date: 04/16/2016 CLINICAL DATA:  75 year old male with shortness of breath EXAM: CHEST 1 VIEW COMPARISON:  Chest radiograph dated  04/14/2016 FINDINGS: Endotracheal tube above the carina. Right IJ central line with tip over central SVC. An enteric tube in proximal stomach in stable positioning. 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 There has been interval improvement of the aeration of the left lung base. Right lung base streaky densities appear similar to prior study. A small left pleural effusion may be present. There is no pneumothorax. The cardiac silhouette is within normal limits. No acute osseous pathology. IMPRESSION: Support lines and tubes in stable positioning. Interval improvement of aeration of the left lung base without complete resolution. Follow-up recommended. Trace left pleural effusion may be present. Right lung base densities appear similar to prior radiograph. Electronically Signed   By: Elgie CollardArash  Radparvar M.D.   On: 04/16/2016 04:38   Ct Head Wo Contrast  Result Date: 04/15/2016 CLINICAL DATA:  Altered mental status.  Possible seizure activity. EXAM: CT HEAD WITHOUT CONTRAST TECHNIQUE: Contiguous axial images were obtained from the base of the skull through the vertex without intravenous contrast. COMPARISON:  02/15/2016 FINDINGS: Brain: No evidence of acute infarction, hemorrhage, hydrocephalus, extra-axial collection or mass lesion/mass effect. Mild cerebral atrophy is stable. Mild to moderate chronic small vessel disease unchanged in appearance. Vascular: No hyperdense vessel or unexpected calcification. Skull: Normal. Negative for fracture or focal lesion. Sinuses/Orbits: No acute finding. Other: None. IMPRESSION: No acute intracranial abnormality. 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 Electronically Signed   By: Myles RosenthalJohn  Stahl M.D.   On: 04/15/2016 17:13     STUDIES:  CT of head 10/18>>No acute intracranial abnormality.  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 10/19 ECHO>>The cavity size was moderately dilated.  Systolicfunction was normal. The estimated ejection fraction was 70%  CULTURES: Blood x2 10/18>>negative Sputum 10/19>>reincubated.  Urine>>negative.  MRSA PCR 10/19>>negative.   ANTIBIOTICS: Azithromycin 10/18>>x1 dose Ceftriaxone 10/18>>x1 dose Zosyn 10/19>> Vancomycin 10/19>>10/21  SIGNIFICANT EVENTS: 10/18-[facility]/19-Rapid response initiated due to pt developing acute on chronic hypoxic hypercapnic respiratory failure secondary to bilateral lobe pneumonia and AECOPD requiring mechanical intubation and septic shock requiring pressors  LINES/TUBES: PIV's x3>> Right IJ 10/19>> ETT 10/19>>  ASSESSMENT / PLAN:  PULMONARY A: Acute on chronic hypoxic hypercapnic respiratory failure secondary to bilateral lobe pneumonia and AECOPD, pulmonary edema  Mechanical Ventilation Hx: Allergic rhinitis and Home O2 at 3L Questionable PE-Unable to obtain CT PE study due to AKI, on empiric heparin.  CXR 10/21 images reviewed; mild bibasilar atelectasis.  P:   Full vent support; PRVC/24/450/5/60% Continue Bronchodilators Routine ABG CXR in am  Maintain O2 sats 88% to 92% Continue Vancomycin/zosyn Heparin gtte with PTT monitoring  CARDIOVASCULAR A:  Hypotension secondary to septic shock and/or PE.  Congestive Heart Failure-- acute diastolic. Echo 04/14/16; EF=70% Hx: CAD, PCI (2016 due to mLAD 30% and mLCX 99% occlusion) and diastolic dysfunction) Elevated troponins P:  Prn levophed gtt to maintain map >65 Continue proscar and atorvastatin  Cortisol ordered, results pending; will start empiric steroids.  Telemetry monitoring    RENAL A:   Acute on chronic renal failure Hyperkalemia-resolved Mild Hypernatremia  P:   Repeat BMP today 10/19 Replace electrolytes as indicated Strict I/o Foley in place Follow chemistry  GASTROINTESTINAL A:   No acute issues P:   Pepcid for PUD prophylaxis Tube feeds  HEMATOLOGIC A:   Anemia  P:  Heparin  for VTE prophylaxis Trend CBC's Monitor for s/sx of bleeding Transfuse for hgb <7  INFECTIOUS A:   Bilateral lobe pneumonia Elevated lactic acid-resolved P:   Trend WBC and monitor fever curve Continue abx as listed above Follow cultures  ENDOCRINE A:   No acute issues  P:   CBG's q4hrs Hypoglycemia Protocol   NEUROLOGIC A:   Acute encephalopathy likely secondary to infectious process and respiratory failure Questionable seizures-CT negative P:   RASS goal: 0 to -1 Prn fentanyl and versed to maintain RASS goal WUA daily Lights on during the day Continue keppra EEG Neurology consult  Disposition and family update: Wife and daughter updated at bedside on current treatment plan.  Total CC time=35 minutes  Magdalene S. Tukov ANP-BC Pulmonary and Critical Care Medicine [facility] Pager [phone] or [phone]   Pt seen and examined with NP, agree with assessment and plan; severe septic shock with hypotension, CHF, possible PE, aki. Continue current empiric/supportive measures. Pt is DNR.   Wells Guiles, M.D.  04/16/2016   Critical Care Attestation.  I have personally obtained a history, examined the patient, evaluated laboratory and imaging results, formulated the assessment and plan and placed orders. The Patient requires high complexity decision making for assessment and support, frequent evaluation and titration of therapies, application of advanced monitoring technologies and extensive interpretation of multiple databases. The patient has critical illness that could lead imminently to failure of 1 or more organ systems and requires the highest level of physician preparedness to intervene.  Critical Care Time devoted to patient care services described in this note is 45 minutes and is exclusive of time spent in procedures.

## 2016-04-16 NOTE — Progress Notes (Signed)
SUBJECTIVE: Pt sedated/intubated.   Tele: sinus tach  BP 108/66   Pulse (!) 103   Temp 99.4 F (37.4 C) (Axillary)   Resp 20   Ht 5' 6"  (1.676 m)   Wt 146 lb 2.6 oz (66.3 kg)   SpO2 92%   BMI 23.59 kg/m   Intake/Output Summary (Last 24 hours) at 04/16/16 1019 Last data filed at 04/16/16 1000  Gross per 24 hour  Intake          2879.03 ml  Output             1055 ml  Net          1824.03 ml    PHYSICAL EXAM General: Well developed, well nourished, intubated, sedated.  Psych:  sedated Neck: + JVD. No masses noted.  Lungs: Coarse BS bilaterally  Heart: Regular, tachy with no murmurs noted. Abdomen: Bowel sounds are present. Soft, non-tender.  Extremities: No lower extremity edema.   LABS: Basic Metabolic Panel:  Recent Labs  04/14/16 1103  04/15/16 0508 04/15/16 1736  04/15/16 2350 04/16/16 0454  NA 146*  --  146* 147*  --   --  145  K 5.2*  < > 3.5 3.1*  < > 3.2* 4.1  CL 107  --  105 105  --   --  106  CO2 30  --  36* 36*  --   --  35*  GLUCOSE 133*  --  181* 109*  --   --  132*  BUN 54*  --  54* 50*  --   --  45*  CREATININE 2.16*  --  1.86* 1.78*  --   --  1.53*  CALCIUM 7.8*  --  7.6* 7.7*  --   --  7.7*  MG 2.3  --  2.0 2.1  --   --   --   PHOS 5.8*  --  3.6  --   --   --   --   < > = values in this interval not displayed. CBC:  Recent Labs  04/01/2016 1332 04/14/16 1103 04/15/16 1108 04/16/16 0454  WBC 8.3 13.0* 12.6* 10.0  NEUTROABS 7.5* 11.4*  --   --   HGB 12.2* 11.4* 10.4* 10.5*  HCT 39.4* 39.2* 33.6* 32.5*  MCV 89.6 94.7 88.7 87.7  PLT 101* 113* 87* 85*   Cardiac Enzymes:  Recent Labs  04/06/2016 1827 04/14/16 1103 04/14/16 1713  CKTOTAL  --  226  --   TROPONINI 0.05* 0.05* 0.11*    Current Meds: . aspirin  81 mg Oral Daily  . budesonide (PULMICORT) nebulizer solution  0.5 mg Nebulization BID  . chlorhexidine gluconate (MEDLINE KIT)  15 mL Mouth Rinse BID  . famotidine  20 mg Oral QHS  . finasteride  5 mg Oral QHS  .  insulin aspart  0-15 Units Subcutaneous Q4H  . ipratropium-albuterol  3 mL Nebulization Q6H  . levETIRAcetam  500 mg Intravenous Q12H  . mouth rinse  15 mL Mouth Rinse 10 times per day  . piperacillin-tazobactam (ZOSYN)  IV  3.375 g Intravenous Q8H  . senna-docusate  1 tablet Oral BID  . sodium chloride flush  10-40 mL Intracatheter Q12H     ASSESSMENT AND PLAN:  1. Acute respiratory failure/Acute on chronic diastolic CHF/Pneumonia/COPD: Pt currently intubated. He is being treated for pneumonia. He has baseline severe lung disease/COPD. Echo with normal LV systolic function. Cannot exclude component of mild volume overload although this seems  to be more driven by his pulmonary issues. Chest x-ray without gross pulmonary edema. Can consider diuresis when renal function improves, BP will tolerate.   2. Troponin elevation: Pt with known CAD. Likely mild troponin elevation in setting of sepsis/hypotension due to demand ischemia. No ischemic evaluation planned at this time.   3. Dilated RV with decreased RV function: Cannot exclude PE. Likely due to chronic lung disease. He is now on IV heparin as his renal function does not allow for CTA at this time to exclude PE.   Christopher McAlhany  10/21/201710:19 AM

## 2016-04-16 NOTE — Progress Notes (Signed)
Noticed a change in lung sounds. Notified MD who ordered a STAT CXR and 20 mg of Lasix. Will continue to monitor.

## 2016-04-16 NOTE — Progress Notes (Signed)
eLink Physician-Brief Progress Note Patient Name: Keith NapoleonHenry Harmon Varghese DOB: 1941/01/31 MRN: [id number]   Date of Service  04/16/2016  HPI/Events of Note  Hypokalemia  eICU Interventions  Potassium replaced     Intervention Category Intermediate Interventions: Electrolyte abnormality - evaluation and management  DETERDING,ELIZABETH 04/16/2016, 12:21 AM

## 2016-04-17 ENCOUNTER — Inpatient Hospital Stay: Payer: Medicare Other

## 2016-04-17 DIAGNOSIS — R251 Tremor, unspecified: Secondary | ICD-10-CM

## 2016-04-17 LAB — BLOOD GAS, ARTERIAL
Acid-Base Excess: 9.6 mmol/L — ABNORMAL HIGH (ref 0.0–2.0)
Bicarbonate: 38.5 mmol/L — ABNORMAL HIGH (ref 20.0–28.0)
FIO2: 0.5
Mechanical Rate: 24
O2 Saturation: 95.8 %
PCO2 ART: 73 mmHg — AB (ref 32.0–48.0)
PEEP/CPAP: 5 cmH2O
PH ART: 7.33 — AB (ref 7.350–7.450)
Patient temperature: 37
VT: 450 mL
pO2, Arterial: 86 mmHg (ref 83.0–108.0)

## 2016-04-17 LAB — CBC
HCT: 30.8 % — ABNORMAL LOW (ref 40.0–52.0)
Hemoglobin: 9.7 g/dL — ABNORMAL LOW (ref 13.0–18.0)
MCH: 27.8 pg (ref 26.0–34.0)
MCHC: 31.4 g/dL — AB (ref 32.0–36.0)
MCV: 88.4 fL (ref 80.0–100.0)
PLATELETS: 84 10*3/uL — AB (ref 150–440)
RBC: 3.48 MIL/uL — AB (ref 4.40–5.90)
RDW: 15.9 % — ABNORMAL HIGH (ref 11.5–14.5)
WBC: 10.4 10*3/uL (ref 3.8–10.6)

## 2016-04-17 LAB — GLUCOSE, CAPILLARY
GLUCOSE-CAPILLARY: 122 mg/dL — AB (ref 65–99)
GLUCOSE-CAPILLARY: 135 mg/dL — AB (ref 65–99)
GLUCOSE-CAPILLARY: 142 mg/dL — AB (ref 65–99)
GLUCOSE-CAPILLARY: 147 mg/dL — AB (ref 65–99)
Glucose-Capillary: 149 mg/dL — ABNORMAL HIGH (ref 65–99)
Glucose-Capillary: 142 mg/dL — ABNORMAL HIGH (ref 65–99)

## 2016-04-17 LAB — BASIC METABOLIC PANEL
Anion gap: 8 (ref 5–15)
BUN: 50 mg/dL — AB (ref 6–20)
CALCIUM: 7.8 mg/dL — AB (ref 8.9–10.3)
CO2: 34 mmol/L — ABNORMAL HIGH (ref 22–32)
CREATININE: 1.73 mg/dL — AB (ref 0.61–1.24)
Chloride: 103 mmol/L (ref 101–111)
GFR calc Af Amer: 43 mL/min — ABNORMAL LOW (ref >60)
GFR, EST NON AFRICAN AMERICAN: 37 mL/min — AB (ref >60)
Glucose, Bld: 178 mg/dL — ABNORMAL HIGH (ref 65–99)
Potassium: 4 mmol/L (ref 3.5–5.1)
SODIUM: 145 mmol/L (ref 135–145)

## 2016-04-17 LAB — HEPARIN LEVEL (UNFRACTIONATED): HEPARIN UNFRACTIONATED: 0.52 [IU]/mL (ref 0.30–0.70)

## 2016-04-17 MED ORDER — COSYNTROPIN 0.25 MG IJ SOLR
0.2500 mg | Freq: Once | INTRAMUSCULAR | Status: AC
  Administered 2016-04-18: 0.25 mg via INTRAVENOUS
  Filled 2016-04-17: qty 0.25

## 2016-04-17 MED ORDER — SODIUM CHLORIDE 0.9 % IV BOLUS (SEPSIS)
250.0000 mL | Freq: Once | INTRAVENOUS | Status: AC
  Administered 2016-04-17: 500 mL via INTRAVENOUS

## 2016-04-17 NOTE — Progress Notes (Signed)
Ct chest images reviewed; c/w severe pneumonia, atelectasis, emphsema.  Dopplers negative for LE DVT.   --Will discontinue IV heparin.   Wells Guiles-Deep Adekunle Rohrbach, M.D. 04/17/2016

## 2016-04-17 NOTE — Consult Note (Signed)
Reason for Consult:tremors Referring Physician: Dr. Nicholos Johns  CC: tremors  HPI: Keith Watts is an 74 y.o. male admitted 10/18 with increased weakness, cough, poor PO intake. Pt was intubated on 10/19 for PNA and hypercapnic respiratory failure.   Currently intubated and is on Fentanyl 150/hr Neurology consulted for tremors in upper and lower extremities that is intermittent.  As per family who are at bedside. Pt does have intermitted tremors.  He was started on Keppra at 500 BID   In the middle of the night had change in cardiac rhythm with shaking episode that resolved with versed push.    Past Medical History:  Diagnosis Date  . Allergic rhinitis   . BPH (benign prostatic hyperplasia)   . CAD (coronary artery disease)    a. 1 vessel CAD by cardiac cath in 2010 managed medically; b. cath 11/2014 mLAD 30%, mLCx 99% s/p PCI/DES 0%, mRCA 40%, mod calcified coronaries, nl LVSF, mod elevated LVEDP, mild AS  . Chicken pox   . Chronic respiratory failure (HCC)    a. on 3L O2 via nasal cannula  . COPD (chronic obstructive pulmonary disease) (HCC)   . Diastolic dysfunction    a. echo 09/2014: EF 50-55%, impaired relaxation of LV diastolic filling, nl RV size & sys fxn, mildly dilated LA, mild MR, mild Ao stenosis, nl PASP  . Measles   . Mumps   . Pneumonia    a. admission to [facility] 09/2014 & 11/2014  . Vitamin D deficiency     Past Surgical History:  Procedure Laterality Date  . APPENDECTOMY    . CARDIAC CATHETERIZATION N/A 11/28/2014   Procedure: Left Heart Cath and Coronary Angiography;  Surgeon: Iran Ouch, MD;  Location: [facility] INVASIVE CV LAB;  Service: Cardiovascular;  Laterality: N/A;  please schedule for lunch time with Dr. Kirke Corin, MD  . CARDIAC CATHETERIZATION N/A 11/28/2014   Procedure: Coronary Stent Intervention;  Surgeon: Iran Ouch, MD;  Location: [facility] INVASIVE CV LAB;  Service: Cardiovascular;  Laterality: N/A;  . EYE SURGERY    . TONSILLECTOMY      Family  History  Problem Relation Age of Onset  . Cancer - Lung Mother   . Cancer - Colon Father   . Colon cancer Father   . Liver cancer Brother   . Pancreatic cancer Brother   . Prostate cancer Brother   . Liver cancer Brother     Social History:  reports that he quit smoking about 7 years ago. He has a 100.00 pack-year smoking history. He has never used smokeless tobacco. He reports that he does not drink alcohol or use drugs.  No Known Allergies  Medications: I have reviewed the patient's current medications.  ROS: Unable to obtain as sedated   Physical Examination: Blood pressure (!) 101/55, pulse 88, temperature 98.9 F (37.2 C), temperature source Axillary, resp. rate (!) 26, height 5\' 6"  (1.676 m), weight 67.2 kg (148 lb 2.4 oz), SpO2 91 %.  Intubated Does not follow commands EOM intact Withdraws from painful stimuli bilaterally   Laboratory Studies:   Basic Metabolic Panel:  Recent Labs Lab 04/14/16 1103  04/15/16 0508 04/15/16 1736 04/15/16 2042 04/15/16 2350 04/16/16 0454 04/17/16 0153  NA 146*  --  146* 147*  --   --  145 145  K 5.2*  < > 3.5 3.1* 3.1* 3.2* 4.1 4.0  CL 107  --  105 105  --   --  106 103  CO2 30  --  36* 36*  --   --  35* 34*  GLUCOSE 133*  --  181* 109*  --   --  132* 178*  BUN 54*  --  54* 50*  --   --  45* 50*  CREATININE 2.16*  --  1.86* 1.78*  --   --  1.53* 1.73*  CALCIUM 7.8*  --  7.6* 7.7*  --   --  7.7* 7.8*  MG 2.3  --  2.0 2.1  --   --   --   --   PHOS 5.8*  --  3.6  --   --   --   --   --   < > = values in this interval not displayed.  Liver Function Tests:  Recent Labs Lab 2016/05/11 1332 04/14/16 1103  AST 29 33  ALT 19 17  ALKPHOS 85 87  BILITOT 0.8 1.0  PROT 7.6 6.5  ALBUMIN 3.3* 2.9*    Recent Labs Lab May 11, 2016 1332  LIPASE 45   No results for input(s): AMMONIA in the last 168 hours.  CBC:  Recent Labs Lab 05-11-2016 1332 04/14/16 1103 04/15/16 1108 04/16/16 0454 04/17/16 0153  WBC 8.3 13.0* 12.6* 10.0  10.4  NEUTROABS 7.5* 11.4*  --   --   --   HGB 12.2* 11.4* 10.4* 10.5* 9.7*  HCT 39.4* 39.2* 33.6* 32.5* 30.8*  MCV 89.6 94.7 88.7 87.7 88.4  PLT 101* 113* 87* 85* 84*    Cardiac Enzymes:  Recent Labs Lab 05/11/16 1332 05/11/16 1827 04/14/16 1103 04/14/16 1713  CKTOTAL  --   --  226  --   TROPONINI 0.04* 0.05* 0.05* 0.11*    BNP: Invalid input(s): POCBNP  CBG:  Recent Labs Lab 04/16/16 1915 04/17/16 0002 04/17/16 0349 04/17/16 0726 04/17/16 1138  GLUCAP 153* 142* 149* 142* 147*    Microbiology: Results for orders placed or performed during the hospital encounter of 05-11-2016  Blood Culture (routine x 2)     Status: None (Preliminary result)   Collection Time: 05-11-16  1:31 PM  Result Value Ref Range Status   Specimen Description BLOOD LEFT HAND  Final   Special Requests   Final    BOTTLES DRAWN AEROBIC AND ANAEROBIC AER ANA   Culture NO GROWTH 4 DAYS  Final   Report Status PENDING  Incomplete  Blood Culture (routine x 2)     Status: None (Preliminary result)   Collection Time: May 11, 2016  1:32 PM  Result Value Ref Range Status   Specimen Description BLOOD RIGHT AC  Final   Special Requests   Final    BOTTLES DRAWN AEROBIC AND ANAEROBIC AER ANA   Culture NO GROWTH 4 DAYS  Final   Report Status PENDING  Incomplete  Culture, blood (routine x 2) Call MD if unable to obtain prior to antibiotics being given     Status: None (Preliminary result)   Collection Time: 05/11/2016  6:27 PM  Result Value Ref Range Status   Specimen Description BLOOD RIGHT HAND  Final   Special Requests   Final    BOTTLES DRAWN AEROBIC AND ANAEROBIC 10CCAERO,10CCANA   Culture NO GROWTH 4 DAYS  Final   Report Status PENDING  Incomplete  Culture, blood (routine x 2) Call MD if unable to obtain prior to antibiotics being given     Status: None (Preliminary result)   Collection Time: 05/11/16  6:37 PM  Result Value Ref Range Status   Specimen Description BLOOD LEFT  HAND  Final    Special Requests BOTTLES DRAWN AEROBIC AND ANAEROBIC 8CCAERO,8CCANA  Final   Culture NO GROWTH 4 DAYS  Final   Report Status PENDING  Incomplete  MRSA PCR Screening     Status: None   Collection Time: 04/14/16  9:27 AM  Result Value Ref Range Status   MRSA by PCR NEGATIVE NEGATIVE Final    Comment:        The GeneXpert MRSA Assay (FDA approved for NASAL specimens only), is one component of a comprehensive MRSA colonization surveillance program. It is not intended to diagnose MRSA infection nor to guide or monitor treatment for MRSA infections.   Culture, respiratory (NON-Expectorated)     Status: None   Collection Time: 04/14/16 10:02 AM  Result Value Ref Range Status   Specimen Description TRACHEAL ASPIRATE  Final   Special Requests Normal  Final   Gram Stain   Final    MODERATE WBC PRESENT, PREDOMINANTLY PMN FEW SQUAMOUS EPITHELIAL CELLS PRESENT MODERATE GRAM POSITIVE COCCI IN PAIRS AND CHAINS FEW GRAM POSITIVE COCCI IN CLUSTERS FEW GRAM NEGATIVE RODS FEW GRAM POSITIVE RODS RARE YEAST    Culture   Final    Consistent with normal respiratory flora. Performed at [facility]    Report Status 04/16/2016 FINAL  Final  Urine culture     Status: None   Collection Time: 04/14/16 10:47 AM  Result Value Ref Range Status   Specimen Description URINE, RANDOM  Final   Special Requests NONE  Final   Culture   Final    NO GROWTH 1 DAY [facility]    Report Status 04/15/2016 FINAL  Final    Coagulation Studies:  Recent Labs  04/15/16 1108  LABPROT 16.2*  INR 1.29    Urinalysis:   Recent Labs Lab 04/14/16 1047  COLORURINE YELLOW*  LABSPEC 1.016  PHURINE 5.0  GLUCOSEU NEGATIVE  HGBUR NEGATIVE  BILIRUBINUR NEGATIVE  KETONESUR NEGATIVE  PROTEINUR 30*  NITRITE NEGATIVE  LEUKOCYTESUR NEGATIVE    Lipid Panel:     Component Value Date/Time   CHOL 154 10/24/2014 0755   TRIG 98 10/24/2014 0755   HDL 40 10/24/2014 0755   VLDL 20  10/24/2014 0755   LDLCALC 94 10/24/2014 0755    HgbA1C: No results found for: HGBA1C  Urine Drug Screen:  No results found for: LABOPIA, COCAINSCRNUR, LABBENZ, AMPHETMU, THCU, LABBARB  Alcohol Level: No results for input(s): ETH in the last 168 hours.  Other results: EKG: normal EKG, normal sinus rhythm, unchanged from previous tracings.  Imaging: Dg Chest 1 View  Result Date: 04/17/2016 CLINICAL DATA:  Dyspnea EXAM: CHEST 1 VIEW COMPARISON:  04/16/2016 FINDINGS: Cardiac shadow is stable. Endotracheal tube, nasogastric catheter and right jugular central line are again seen and stable. The lungs are well aerated bilaterally. Persistent left pleural effusion and left basilar consolidation is seen. No new focal abnormality is noted. IMPRESSION: No significant interval change from the prior exam. Electronically Signed   By: Alcide CleverMark  Lukens M.D.   On: 04/17/2016 07:48   Dg Chest 1 View  Result Date: 04/16/2016 CLINICAL DATA:  75 year old male with shortness of breath EXAM: CHEST 1 VIEW COMPARISON:  Chest radiograph dated 04/14/2016 FINDINGS: Endotracheal tube above the carina. Right IJ central line with tip over central SVC. An enteric tube in proximal stomach in stable positioning. 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 There has been interval improvement of the aeration of the  left lung base. Right lung base streaky densities appear similar to prior study. A small left pleural effusion may be present. There is no pneumothorax. The cardiac silhouette is within normal limits. No acute osseous pathology. IMPRESSION: Support lines and tubes in stable positioning. Interval improvement of aeration of the left lung base without complete resolution. Follow-up recommended. Trace left pleural effusion may be present. Right lung base densities appear similar to prior radiograph. Electronically Signed   By: Elgie Collard M.D.   On: 04/16/2016 04:38    Ct Head Wo Contrast  Result Date: 04/15/2016 CLINICAL DATA:  Altered mental status.  Possible seizure activity. EXAM: CT HEAD WITHOUT CONTRAST TECHNIQUE: Contiguous axial images were obtained from the base of the skull through the vertex without intravenous contrast. COMPARISON:  04/25/16 FINDINGS: Brain: No evidence of acute infarction, hemorrhage, hydrocephalus, extra-axial collection or mass lesion/mass effect. Mild cerebral atrophy is stable. Mild to moderate chronic small vessel disease unchanged in appearance. Vascular: No hyperdense vessel or unexpected calcification. Skull: Normal. Negative for fracture or focal lesion. Sinuses/Orbits: No acute finding. Other: None. IMPRESSION: No acute intracranial abnormality. 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 Electronically Signed   By: Myles Rosenthal M.D.   On: 04/15/2016 17:13   Ct Chest Wo Contrast  Result Date: 04/17/2016 CLINICAL DATA:  75 yo male with a PMH of Pneumonia, Diastolic dysfunction (Echo 09/2014 EF 50-55%), COPD, Former smoker (1 to 1.5 PPD for 40 yrs.), Chronic O2 3L via nasal cannula, CAD, PCI 2016, BPH, and Allergic Rhinitis. The respiratory failure. Intubation. Septic shock. EXAM: CT CHEST WITHOUT CONTRAST TECHNIQUE: Multidetector CT imaging of the chest was performed following the standard protocol without IV contrast. COMPARISON:  04/17/2016 FINDINGS: Cardiovascular: Heart size is normal. Extensive calcification of the mitral annulus. Coronary artery calcifications are present. Trace pericardial effusion. Extensive atherosclerotic calcification of the thoracic aorta. No aneurysm. Mediastinum/Nodes: The visualized portion of the thyroid gland has a normal appearance. Left paratracheal lymph node is 1.8 cm. Precarinal lymph node is 2.5 cm. Azygoesophageal a recess known is 2.3 cm. Nasogastric tube is in place in the esophagus. Lungs/Pleura: There are bilateral pleural effusions. Emphysematous changes are identified in  the upper lobes. There patchy infiltrates involving the left upper lobe and both lower lobes and to a lesser extent, the right upper lobe. Findings favor infectious infiltrates. No suspicious pulmonary nodules. Endotracheal tube is in place, tip above the carina. Upper Abdomen: Nasogastric tube to the stomach. Small amount of contrast identified within the stomach. There is atherosclerotic calcification of the abdominal aorta. Musculoskeletal: Mild degenerative changes identified in the mid thoracic spine. IMPRESSION: 1. Multifocal infiltrates and bilateral pleural effusions. 2. Mediastinal adenopathy most likely reactive in the setting of infiltrates. 3. Coronary artery disease. 4. Nasogastric tube and endotracheal tube. Electronically Signed   By: Norva Pavlov M.D.   On: 04/17/2016 10:26   US Venous Img Lower Bilateral  Result Date: 04/17/2016 CLINICAL DATA:  Hypoxia, shortness of breath, recent injury, fall, pain EXAM: BILATERAL LOWER EXTREMITY VENOUS DOPPLER ULTRASOUND TECHNIQUE: Gray-scale sonography with graded compression, as well as color Doppler and duplex ultrasound were performed to evaluate the lower extremity deep venous systems from the level of the common femoral vein and including the common femoral, femoral, profunda femoral, popliteal and calf veins including the posterior tibial, peroneal and gastrocnemius veins when visible. The superficial great saphenous vein was also interrogated. Spectral Doppler was utilized to evaluate flow at rest and with distal augmentation maneuvers in the common femoral,  femoral and popliteal veins. COMPARISON:  None. FINDINGS: RIGHT LOWER EXTREMITY Common Femoral Vein: No evidence of thrombus. Normal compressibility, respiratory phasicity and response to augmentation. Saphenofemoral Junction: No evidence of thrombus. Normal compressibility and flow on color Doppler imaging. Profunda Femoral Vein: No evidence of thrombus. Normal compressibility and flow on  color Doppler imaging. Femoral Vein: No evidence of thrombus. Normal compressibility, respiratory phasicity and response to augmentation. Popliteal Vein: No evidence of thrombus. Normal compressibility, respiratory phasicity and response to augmentation. Calf Veins: No evidence of thrombus. Normal compressibility and flow on color Doppler imaging. Superficial Great Saphenous Vein: No evidence of thrombus. Normal compressibility and flow on color Doppler imaging. Venous Reflux:  None. Other Findings:  None. LEFT LOWER EXTREMITY Common Femoral Vein: No evidence of thrombus. Normal compressibility, respiratory phasicity and response to augmentation. Saphenofemoral Junction: No evidence of thrombus. Normal compressibility and flow on color Doppler imaging. Profunda Femoral Vein: No evidence of thrombus. Normal compressibility and flow on color Doppler imaging. Femoral Vein: No evidence of thrombus. Normal compressibility, respiratory phasicity and response to augmentation. Popliteal Vein: No evidence of thrombus. Normal compressibility, respiratory phasicity and response to augmentation. Calf Veins: No evidence of thrombus. Normal compressibility and flow on color Doppler imaging. Superficial Great Saphenous Vein: No evidence of thrombus. Normal compressibility and flow on color Doppler imaging. Venous Reflux:  None. Other Findings:  None. IMPRESSION: No evidence of deep venous thrombosis. Electronically Signed   By: Judie Petit.  Shick M.D.   On: 04/17/2016 11:15   Dg Chest Port 1 View  Result Date: 04/16/2016 CLINICAL DATA:  Decreased breath sounds EXAM: PORTABLE CHEST 1 VIEW COMPARISON:  04/16/2016 FINDINGS: Cardiac shadow is stable. Endotracheal tube, nasogastric catheter and right jugular central line are again seen and stable. Stable streaky opacities are noted in the right lung base. Small left pleural effusion is seen as well as some retrocardiac consolidation. No acute bony abnormality is noted. IMPRESSION: Stable  appearance of the chest when compared with the prior exam. Electronically Signed   By: Alcide Clever M.D.   On: 04/16/2016 13:15     Assessment/Plan:  75 y.o. male admitted 10/18 with increased weakness, cough, poor PO intake. Pt was intubated on 10/19 for PNA and hypercapnic respiratory failure.   Currently intubated and is on Fentanyl 150/hr Neurology consulted for tremors in upper and lower extremities that is intermittent.  As per family who are at bedside. Pt does have intermitted tremors.  He was started on Keppra at 500 BID   In the middle of the night had change in cardiac rhythm with shaking episode that resolved with versed push  - Con't Keppra - Con'tt low dose versed as pt is not ready to be extubated since no EEG available.  - Obtain EEG in AM. Loretha Brasil, Delon Revelo   04/17/2016, 11:51 AM

## 2016-04-17 NOTE — Progress Notes (Signed)
SUBJECTIVE: Intubated, sedated.   Tele: sinus  BP (!) 100/50   Pulse 82   Temp 98.9 F (37.2 C) (Axillary)   Resp (!) 26   Ht _0  (1.676 m)   Wt 148 lb 2.4 oz (67.2 kg)   SpO2 95%   BMI 23.91 kg/m   Intake/Output Summary (Last 24 hours) at 04/17/16 2671 Last data filed at 04/17/16 0800  Gross per 24 hour  Intake          2463.82 ml  Output             1565 ml  Net           898.82 ml    PHYSICAL EXAM General: Well developed, well nourished, intubated, sedated.  Psych:  sedated Neck: + JVD. No masses noted.  Lungs: Coarse BS bilaterally. Crackles bases Heart: Regular, tachy with no murmurs noted. Abdomen: Bowel sounds are present. Soft, non-tender.  Extremities: No lower extremity edema.   LABS: Basic Metabolic Panel:  Recent Labs  04/14/16 1103  04/15/16 0508 04/15/16 1736  04/16/16 0454 04/17/16 0153  NA 146*  --  146* 147*  --  145 145  K 5.2*  < > 3.5 3.1*  < > 4.1 4.0  CL 107  --  105 105  --  106 103  CO2 30  --  36* 36*  --  35* 34*  GLUCOSE 133*  --  181* 109*  --  132* 178*  BUN 54*  --  54* 50*  --  45* 50*  CREATININE 2.16*  --  1.86* 1.78*  --  1.53* 1.73*  CALCIUM 7.8*  --  7.6* 7.7*  --  7.7* 7.8*  MG 2.3  --  2.0 2.1  --   --   --   PHOS 5.8*  --  3.6  --   --   --   --   < > = values in this interval not displayed. CBC:  Recent Labs  04/14/16 1103  04/16/16 0454 04/17/16 0153  WBC 13.0*  < > 10.0 10.4  NEUTROABS 11.4*  --   --   --   HGB 11.4*  < > 10.5* 9.7*  HCT 39.2*  < > 32.5* 30.8*  MCV 94.7  < > 87.7 88.4  PLT 113*  < > 85* 84*  < > = values in this interval not displayed. Cardiac Enzymes:  Recent Labs  04/14/16 1103 04/14/16 1713  CKTOTAL 226  --   TROPONINI 0.05* 0.11*   Current Meds: . acetylcysteine  3 mL Nebulization Q6H  . aspirin  81 mg Oral Daily  . chlorhexidine gluconate (MEDLINE KIT)  15 mL Mouth Rinse BID  . [START ON 04/18/2016] cosyntropin  0.25 mg Intravenous Once  . famotidine  20 mg Oral  QHS  . finasteride  5 mg Oral QHS  . insulin aspart  0-15 Units Subcutaneous Q4H  . ipratropium-albuterol  3 mL Nebulization Q6H  . levETIRAcetam  500 mg Intravenous Q12H  . mouth rinse  15 mL Mouth Rinse 10 times per day  . piperacillin-tazobactam (ZOSYN)  IV  3.375 g Intravenous Q8H  . senna-docusate  1 tablet Oral BID  . sodium chloride flush  10-40 mL Intracatheter Q12H     ASSESSMENT AND PLAN:  1. Acute respiratory failure/Acute on chronic diastolic CHF/Pneumonia/COPD: Pt currently intubated. He is being treated for pneumonia. He has baseline severe lung disease/COPD. Echo with normal LV systolic function. Respiratory failure  appears to be secondary to pneumonia but he likely also has a component of volume overload. Chest x-ray without gross pulmonary edema. Chest CT pending this am. Can consider diuresis when BP and renal function will tolerate.    2. Troponin elevation: Pt with known CAD. Likely mild troponin elevation in setting of sepsis/hypotension due to demand ischemia. No ischemic evaluation planned at this time.   3. Dilated RV with decreased RV function: Cannot exclude PE. Likely due to chronic lung disease. He is now on IV heparin as his renal function does not allow for CT with contrast at this time to exclude PE.    Lauree Chandler  10/22/20179:23 AM

## 2016-04-17 NOTE — Progress Notes (Signed)
Observation of patient having new jerking movements in bilateral arms up to shoulders. Slight jerking observed in bilateral lower legs. Distant heart sounds noted on apical ausculation. Observation per cardiac monitor of ventricular bigeminy. Vital signs stable. Observed jerking motions for duration of less than 2 minutes. Notified NP on the floor. Will continue to assess the patient for signs of seizure like activity and cardiac rhythm changes.    Labs reviewed, BUN and creatinine levels increasing, decrease in urine output to 6130ml/hr while on my shift. Spoke with NP on floor, orders received for 250ml fluid bolus.

## 2016-04-17 NOTE — Progress Notes (Signed)
Pharmacy Progress Note  Keith Watts is a 75 y.o. male admitted on 04/22/2016 with pneumonia. Patient not responding to sternal rub 10/18 transferred to ICU and subsequently intubated and sedated, requiring pressors.   Plan: 1. Antibiotics: Continue piperacillin/tazobactam EI 3.375g IV Q8hr.    2. Electrolytes: No replacement warranted at this time. Will recheck electrolytes with am labs.    3. Constipation Prevention: Patient currently on fentanyl drip. Will continue patient on senna/docusate 1 tab bid.    Height: 5\' 6"  (167.6 cm) Weight: 148 lb 2.4 oz (67.2 kg) IBW/kg (Calculated) : 63.8  Temp (24hrs), Avg:99.4 F (37.4 C), Min:98.9 F (37.2 C), Max:100.3 F (37.9 C)   Recent Labs Lab 04/14/2016 1332  04/14/16 1103 04/14/16 1713 04/15/16 0508 04/15/16 1108 04/15/16 1736 04/16/16 0454 04/17/16 0153  WBC 8.3  --  13.0*  --   --  12.6*  --  10.0 10.4  CREATININE 1.58*  < > 2.16*  --  1.86*  --  1.78* 1.53* 1.73*  LATICACIDVEN 1.9  --  2.3* 1.1  --   --   --   --   --   < > = values in this interval not displayed.  Estimated Creatinine Clearance: 33.8 mL/min (by C-G formula based on SCr of 1.73 mg/dL (H)).    No Known Allergies  Antimicrobials this admission: ceftriaxone 10/18 >>  10/19 azithromycin 10/18 >>  10/19 Vancomycin 10/19 >> 10/19   Zosyn 10/19 >>  Dose adjustments this admission:  Microbiology results: 10/19: MRSA PCR: negative 10/18 BCx: NGTD x 4 10/18 UCx: no growth  10/18 Sputum: reincubated for better growth   Pharmacy will continue to monitor and adjust per consult.   Evon Dejarnett D 04/17/2016 10:43 AM

## 2016-04-17 NOTE — Progress Notes (Signed)
PULMONARY / CRITICAL CARE MEDICINE   Name: Keith Watts MRN: [id number] DOB: [birth year]    ADMISSION DATE:  May 11, 2016 CONSULTATION DATE:  04/14/2016  REFERRING MD:  Dr. Elisabeth Pigeon  CHIEF COMPLAINT:  Weakness  Patient profile: This is a 75 yo male with a PMH of Pneumonia, Diastolic dysfunction (Echo 09/2014 EF 50-55%), COPD, Former smoker (1 to 1.5 PPD for 40 yrs.), Chronic O2 3L via nasal cannula, CAD, PCI 2016, BPH, and Allergic Rhinitis.  He presented to [facility] ER on 10/18 and admitted to the [facility] ([place] unit. On 10/19  rapid response was initiated .Upon rapid response nurses arrival it was noted the pt was unreesponsive to sternal rub and was hypotensive, therefore pt was transferred to ICU and intubated.  PCCM consulted for acute on chronic hypoxic hypercapnic respiratory failure secondary to bilateral lower lobe pneumonia and AECOPD, pulmonary edema requiring mechanical intubation and septic shock requiring pressors.  Subjective: Patient is moderately sedated on the ventilator. He appears to have twitching movements of the upper and lower extremeties. No fever.CT head negative and Keppra initiated for possible seizures. However, per patient's wife, patient has mild baseline tremors.  VITAL SIGNS: BP (!) 100/52   Pulse 97   Temp 99 F (37.2 C) (Axillary)   Resp (!) 24   Ht 5\' 6"  (1.676 m)   Wt 66.3 kg (146 lb 2.6 oz)   SpO2 96%   BMI 23.59 kg/m   HEMODYNAMICS: CVP:  [0 mmHg-17 mmHg] 10 mmHg  VENTILATOR SETTINGS: Vent Mode: PRVC FiO2 (%):  [40 %-60 %] 50 % Set Rate:  [24 bmp] 24 bmp Vt Set:  [450 mL] 450 mL PEEP:  [5 cmH20] 5 cmH20 Plateau Pressure:  [12 cmH20-21 cmH20] 12 cmH20  INTAKE / OUTPUT: I/O last 3 completed shifts: In: 3846.7 [I.V.:1331.7; NG/GT:1800; IV Piggyback:715] Out: 2005 [Urine:2005]  PHYSICAL EXAMINATION: General:  Chronically ill appearing Caucasian male Neuro:  Sedated on ventilator, PERRL, does not follow commands HEENT:  Supple, no  JVD Cardiovascular:  Sinus tachycardia, s1s2, no M/R/G Lungs:  Diminished throughout, even, non labored Abdomen:  Hypoactive BS x4, soft, non tender, non distended Musculoskeletal:  Normal bulk and tone Skin:  Intact no rashes or lesions  LABS:  BMET  Recent Labs Lab 04/15/16 1736  04/15/16 2350 04/16/16 0454 04/17/16 0153  NA 147*  --   --  145 145  K 3.1*  < > 3.2* 4.1 4.0  CL 105  --   --  106 103  CO2 36*  --   --  35* 34*  BUN 50*  --   --  45* 50*  CREATININE 1.78*  --   --  1.53* 1.73*  GLUCOSE 109*  --   --  132* 178*  < > = values in this interval not displayed.  Electrolytes  Recent Labs Lab 04/14/16 1103 04/15/16 0508 04/15/16 1736 04/16/16 0454 04/17/16 0153  CALCIUM 7.8* 7.6* 7.7* 7.7* 7.8*  MG 2.3 2.0 2.1  --   --   PHOS 5.8* 3.6  --   --   --     CBC  Recent Labs Lab 04/15/16 1108 04/16/16 0454 04/17/16 0153  WBC 12.6* 10.0 10.4  HGB 10.4* 10.5* 9.7*  HCT 33.6* 32.5* 30.8*  PLT 87* 85* 84*    Coag's  Recent Labs Lab 2016-05-11 1332 04/15/16 1108  APTT 29  --   INR 1.05 1.29    Sepsis Markers  Recent Labs Lab 05-11-16 1332 04/14/16 1103 04/14/16 1713 04/15/16 0508 04/16/16  0454  LATICACIDVEN 1.9 2.3* 1.1  --   --   PROCALCITON  --  0.16  --  0.34 0.38    ABG  Recent Labs Lab 04/14/16 1447 04/14/16 1756 04/15/16 0412  PHART 7.31* 7.34* 7.31*  PCO2ART 78* 76* 80*  PO2ART 53* 56* 84    Liver Enzymes  Recent Labs Lab 04/05/2016 1332 04/14/16 1103  AST 29 33  ALT 19 17  ALKPHOS 85 87  BILITOT 0.8 1.0  ALBUMIN 3.3* 2.9*    Cardiac Enzymes  Recent Labs Lab 03/29/2016 1827 04/14/16 1103 04/14/16 1713  TROPONINI 0.05* 0.05* 0.11*    Glucose  Recent Labs Lab 04/16/16 0733 04/16/16 1107 04/16/16 1635 04/16/16 1915 04/17/16 0002 04/17/16 0349  GLUCAP 147* 141* 161* 153* 142* 149*    Imaging Dg Chest 1 View  Result Date: 04/16/2016 CLINICAL DATA:  75 year old male with shortness of breath EXAM:  CHEST 1 VIEW COMPARISON:  Chest radiograph dated 04/14/2016 FINDINGS: Endotracheal tube above the carina. Right IJ central line with tip over central SVC. An enteric tube in proximal stomach in stable positioning. 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 There has been interval improvement of the aeration of the left lung base. Right lung base streaky densities appear similar to prior study. A small left pleural effusion may be present. There is no pneumothorax. The cardiac silhouette is within normal limits. No acute osseous pathology. IMPRESSION: Support lines and tubes in stable positioning. Interval improvement of aeration of the left lung base without complete resolution. Follow-up recommended. Trace left pleural effusion may be present. Right lung base densities appear similar to prior radiograph. Electronically Signed   By: Elgie CollardArash  Radparvar M.D.   On: 04/16/2016 04:38   Dg Chest Port 1 View  Result Date: 04/16/2016 CLINICAL DATA:  Decreased breath sounds EXAM: PORTABLE CHEST 1 VIEW COMPARISON:  04/16/2016 FINDINGS: Cardiac shadow is stable. Endotracheal tube, nasogastric catheter and right jugular central line are again seen and stable. Stable streaky opacities are noted in the right lung base. Small left pleural effusion is seen as well as some retrocardiac consolidation. No acute bony abnormality is noted. IMPRESSION: Stable appearance of the chest when compared with the prior exam. Electronically Signed   By: Alcide CleverMark  Lukens M.D.   On: 04/16/2016 13:15     STUDIES:  CT of head 10/18>>No acute intracranial abnormality.  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 10/19 ECHO>>The cavity size was moderately dilated. Systolicfunction was normal. The estimated ejection fraction was 70% 10/20 CT head>> No acute intracranial abnormality  CULTURES: Blood x2 10/18>>negative Sputum 10/19>>reincubated.  Urine>>negative.  MRSA PCR  10/19>>negative.   ANTIBIOTICS: Azithromycin 10/18>>x1 dose Ceftriaxone 10/18>>x1 dose Zosyn 10/19>> Vancomycin 10/19>>10/21  SIGNIFICANT EVENTS: 10/18-[facility] 10/19-Rapid response initiated due to pt developing acute on chronic hypoxic hypercapnic respiratory failure secondary to bilateral lobe pneumonia and AECOPD requiring mechanical intubation and septic shock requiring pressors  LINES/TUBES: PIV's x3>> Right IJ 10/19>> ETT 10/19>>  ASSESSMENT / PLAN:  PULMONARY A: Pneumonia- suspect aspiration.  Acute on chronic hypoxic hypercapnic respiratory failure secondary to bilateral lobe pneumonia and AECOPD, pulmonary edema. ABG 10/22; 7.33/73/86/38.5. Continue with acute on chronic hypercapnic respiratory failure.  Severe COPD with severe apical emphysema and bibasilar mild fibrotic changes.  Mechanical Ventilation;  Hx: Allergic rhinitis and Home O2 at 3L Questionable PE-Unable to obtain CT PE study due to AKI, on empiric heparin.  CXR 10/21 images reviewed; mild bibasilar atelectasis.  P:   Full vent support; PRVC/26/450/5/40% Maintain O2 sats 88% to 92% Continue zosyn Heparin gtt with PTT monitoring Ct Chest.   CARDIOVASCULAR A:  Hypotension secondary to septic shock and/or PE. Now on empiric heparin for possible PE.   Congestive Heart Failure-- acute diastolic.  Hx: CAD, PCI (2016 due to mLAD 30% and mLCX 99% occlusion) and diastolic dysfunction) Elevated troponins Echo 04/17/16; EF=70% P:  Prn levophed gtt to maintain map >65 Check LE extremity dopplers.  Cortisol 10/22=22; equivocal, will order ACTH stim in AM.    RENAL A:   Acute on chronic renal failure, continued. Slight bump in creat secondary to lasix dose yesterday, will monitor.  Hyperkalemia-resolved Mild Hypernatremia  P:   Replace electrolytes as indicated Strict I/o Foley in place Follow chemistry  GASTROINTESTINAL A:   No acute issues P:   Pepcid for PUD  prophylaxis Tube feeds  HEMATOLOGIC A:   Anemia  P:  Heparin for VTE prophylaxis Trend CBC's Monitor for s/sx of bleeding Transfuse for hgb <7  INFECTIOUS A:   Bilateral lobe pneumonia Elevated lactic acid-resolved P:   Trend WBC and monitor fever curve Continue abx as listed above Follow cultures  Procalcitonin < 0.5  ENDOCRINE A:   No acute issues  P:   CBG's q4hrs Hypoglycemia Protocol   NEUROLOGIC A:   Acute encephalopathy likely secondary to infectious process and respiratory failure Questionable seizures-CT negative P:   RASS goal: 0 to -1 Prn fentanyl and versed to maintain RASS goal WUA daily Continue keppra Lights on during the day Continue keppra F/u EEG Neurology consult- follow recommendation.     Wells Guiles, M.D.  04/17/2016  Critical Care Attestation.  I have personally obtained a history, examined the patient, evaluated laboratory and imaging results, formulated the assessment and plan and placed orders. The Patient requires high complexity decision making for assessment and support, frequent evaluation and titration of therapies, application of advanced monitoring technologies and extensive interpretation of multiple databases. The patient has critical illness that could lead imminently to failure of 1 or more organ systems and requires the highest level of physician preparedness to intervene.  Critical Care Time devoted to patient care services described in this note is 35 minutes and is exclusive of time spent in procedures.

## 2016-04-18 ENCOUNTER — Inpatient Hospital Stay: Payer: Medicare Other

## 2016-04-18 DIAGNOSIS — R579 Shock, unspecified: Secondary | ICD-10-CM

## 2016-04-18 DIAGNOSIS — R251 Tremor, unspecified: Secondary | ICD-10-CM

## 2016-04-18 LAB — CBC
HEMATOCRIT: 27.7 % — AB (ref 40.0–52.0)
HEMOGLOBIN: 9 g/dL — AB (ref 13.0–18.0)
MCH: 28.4 pg (ref 26.0–34.0)
MCHC: 32.5 g/dL (ref 32.0–36.0)
MCV: 87.3 fL (ref 80.0–100.0)
Platelets: 91 10*3/uL — ABNORMAL LOW (ref 150–440)
RBC: 3.18 MIL/uL — AB (ref 4.40–5.90)
RDW: 16.8 % — ABNORMAL HIGH (ref 11.5–14.5)
WBC: 8.9 10*3/uL (ref 3.8–10.6)

## 2016-04-18 LAB — CULTURE, BLOOD (ROUTINE X 2)
CULTURE: NO GROWTH
CULTURE: NO GROWTH
Culture: NO GROWTH
Culture: NO GROWTH

## 2016-04-18 LAB — GLUCOSE, CAPILLARY
GLUCOSE-CAPILLARY: 141 mg/dL — AB (ref 65–99)
Glucose-Capillary: 153 mg/dL — ABNORMAL HIGH (ref 65–99)
Glucose-Capillary: 147 mg/dL — ABNORMAL HIGH (ref 65–99)
Glucose-Capillary: 146 mg/dL — ABNORMAL HIGH (ref 65–99)

## 2016-04-18 LAB — BASIC METABOLIC PANEL
ANION GAP: 6 (ref 5–15)
BUN: 55 mg/dL — ABNORMAL HIGH (ref 6–20)
CALCIUM: 7.9 mg/dL — AB (ref 8.9–10.3)
CO2: 34 mmol/L — ABNORMAL HIGH (ref 22–32)
Chloride: 104 mmol/L (ref 101–111)
Creatinine, Ser: 1.93 mg/dL — ABNORMAL HIGH (ref 0.61–1.24)
GFR, EST AFRICAN AMERICAN: 38 mL/min — AB (ref >60)
GFR, EST NON AFRICAN AMERICAN: 33 mL/min — AB (ref >60)
GLUCOSE: 192 mg/dL — AB (ref 65–99)
POTASSIUM: 4.2 mmol/L (ref 3.5–5.1)
SODIUM: 144 mmol/L (ref 135–145)

## 2016-04-18 LAB — MAGNESIUM: MAGNESIUM: 2.4 mg/dL (ref 1.7–2.4)

## 2016-04-18 LAB — ACTH STIMULATION, 3 TIME POINTS
CORTISOL 30 MIN: 29.5 ug/dL
CORTISOL 60 MIN: 35.1 ug/dL
Cortisol, Base: 14.4 ug/dL

## 2016-04-18 LAB — PHOSPHORUS: PHOSPHORUS: 3.9 mg/dL (ref 2.5–4.6)

## 2016-04-18 MED ORDER — POLYETHYLENE GLYCOL 3350 17 G PO PACK
17.0000 g | PACK | Freq: Every day | ORAL | Status: DC
  Administered 2016-04-18: 17 g
  Filled 2016-04-18: qty 1

## 2016-04-18 MED ORDER — BUDESONIDE 0.25 MG/2ML IN SUSP
0.2500 mg | Freq: Four times a day (QID) | RESPIRATORY_TRACT | Status: DC
  Administered 2016-04-18 – 2016-04-20 (×9): 0.25 mg via RESPIRATORY_TRACT
  Filled 2016-04-18 (×9): qty 2

## 2016-04-18 MED ORDER — FREE WATER
200.0000 mL | Freq: Three times a day (TID) | Status: DC
  Administered 2016-04-18 – 2016-04-19 (×4): 200 mL

## 2016-04-18 MED ORDER — BISACODYL 10 MG RE SUPP: 10.0000 mg | Freq: Every day | RECTAL | Status: DC | PRN

## 2016-04-18 MED ORDER — FENTANYL CITRATE (PF) 100 MCG/2ML IJ SOLN: 25.0000 ug | INTRAMUSCULAR | Status: DC | PRN

## 2016-04-18 MED ORDER — FAMOTIDINE 20 MG PO TABS
20.0000 mg | ORAL_TABLET | Freq: Every day | ORAL | Status: DC
  Administered 2016-04-18 – 2016-04-19 (×2): 20 mg
  Filled 2016-04-18 (×2): qty 1

## 2016-04-18 MED ORDER — SENNOSIDES-DOCUSATE SODIUM 8.6-50 MG PO TABS
1.0000 | ORAL_TABLET | Freq: Two times a day (BID) | ORAL | Status: DC
  Administered 2016-04-18 – 2016-04-20 (×4): 1
  Filled 2016-04-18 (×4): qty 1

## 2016-04-18 MED ORDER — MIDAZOLAM HCL 2 MG/2ML IJ SOLN: 1.0000 mg | INTRAMUSCULAR | Status: DC | PRN

## 2016-04-18 MED ORDER — ASPIRIN 81 MG PO CHEW
81.0000 mg | CHEWABLE_TABLET | Freq: Every day | ORAL | Status: DC
  Administered 2016-04-18 – 2016-04-20 (×3): 81 mg
  Filled 2016-04-18 (×3): qty 1

## 2016-04-18 MED ORDER — LORAZEPAM 2 MG/ML IJ SOLN: 0.5000 mg | INTRAMUSCULAR | Status: DC | PRN

## 2016-04-18 MED ORDER — HEPARIN SODIUM (PORCINE) 5000 UNIT/ML IJ SOLN
5000.0000 [IU] | Freq: Three times a day (TID) | INTRAMUSCULAR | Status: DC
  Administered 2016-04-18 – 2016-04-19 (×6): 5000 [IU] via SUBCUTANEOUS
  Filled 2016-04-18 (×7): qty 1

## 2016-04-18 MED ORDER — POLYETHYLENE GLYCOL 3350 17 G PO PACK: 17.0000 g | PACK | Freq: Every day | ORAL | Status: DC | PRN

## 2016-04-18 MED ORDER — BISACODYL 10 MG RE SUPP
10.0000 mg | Freq: Once | RECTAL | Status: AC
  Administered 2016-04-18: 10 mg via RECTAL
  Filled 2016-04-18: qty 1

## 2016-04-18 NOTE — Progress Notes (Signed)
[place] [facility] notified patient GCS 3. Per CDS patient not an candidate for organ donation at this time. CDS to be called upon cardiac death.

## 2016-04-18 NOTE — Progress Notes (Signed)
Nutrition Follow-up  DOCUMENTATION CODES:   Not applicable  INTERVENTION:  -Recommend continuing current TF regimen -Bowel regimen discussed during ICU rounds and MD making adjustments  NUTRITION DIAGNOSIS:   Inadequate oral intake related to inability to eat as evidenced by NPO status.  Being addressed via TF  GOAL:   Patient will meet greater than or equal to 90% of their needs  MONITOR:   Labs, Weight trends, Vent status, TF tolerance, I & O's, Skin  REASON FOR ASSESSMENT:   Ventilator    ASSESSMENT:   Pt remains on vent, off sedation at present, levophed continues  Tolerating Vital 1.2 at rate of 50 ml/hr  Diet Order:   NPO, TF  Skin:  Reviewed, no issues  Last BM:  10/19, constipated  Labs: reviewed  Meds: ss novolog, fentanyl/versed, levophed, senna-docustate  Height:   Ht Readings from Last 1 Encounters:  07/14/15 5\' 6"  (1.676 m)    Weight:   Wt Readings from Last 1 Encounters:  04/18/16 155 lb 10.3 oz (70.6 kg)   Filed Weights   04/16/16 0415 04/17/16 0441 04/18/16 0459  Weight: 146 lb 2.6 oz (66.3 kg) 148 lb 2.4 oz (67.2 kg) 155 lb 10.3 oz (70.6 kg)    Ideal Body Weight:  64.54 kg  BMI:  Body mass index is 25.12 kg/m.  Estimated Nutritional Needs:   Kcal:  1439 calories  Protein:  75-95 gm  Fluid:  >/= 1.4L  EDUCATION NEEDS:   No education needs identified at this time  Romelle StarcherCate Esaw Knippel MS, RD, LDN [phone] Pager  [phone] Weekend/On-Call Pager

## 2016-04-18 NOTE — Progress Notes (Signed)
Maggie, NP notified that patient does not have CXR or ABG ordered after making changes to vent settings. Also made aware that patient is on of levophed and suggested second pressor. Stated she would review and put in orders accordingly.

## 2016-04-18 NOTE — Progress Notes (Addendum)
Chaplain rounded the unit to provide a compassionate presence and support to the patient and family. The patient appeared to be sleeping. The visit was long, inviting and warm where the wife spoke about meeting her husband when she was 75 years old. The spouse also spoke about assembling the family here at the hospital on Thursday for a Doctor / family consultation. [phone] Chaplain Clifton JamesMelvin Tionna Gigante

## 2016-04-18 NOTE — Progress Notes (Signed)
Pt. sats 85% on Fio2 of 40%..increased Fio2 to 60% for sats of 88% or greater.. Will continue to monitor

## 2016-04-18 NOTE — Progress Notes (Signed)
PULMONARY / CRITICAL CARE MEDICINE   Name: Keith Watts MRN: [id number] DOB: 12/16/1940    ADMISSION DATE:  04/19/2016 CONSULTATION DATE:  04/14/2016  REFERRING MD:  Dr. Elisabeth Pigeon  Patient profile: 65 M with severe COPD, chronic O2 dependence, dCHF, presented to [facility] ER on 10/18 and admitted to the [facility] unit. On 10/19  rapid response was initiated for unresponsiveness. Therefore, intubated and transferred to ICU where PCCM assumed care.  MAJOR EVENTS/TEST RESULTS: 10/18 CT head: No acute intracranial abnormality.  10/19 TTE: The cavity size was moderately dilated. Mild-mod AS. Estimated EF 70%. RV dilatation  10/20 CT head: No acute intracranial abnormality 10/20 Cardiology consultation 10/21 Neurology consultation. Doubt seizures. EEG ordered. Continue Keppra. Low dose midaz infusion ordered 10/22 CT chest: Multifocal infiltrates and bilateral pleural effusions. Mediastinal adenopathy most likely reactive in the setting of infiltrates. No PE 10/22 LE venous US: no DVT 10/23 Midaz infusion D/C'd. Remains on norepinephrine infusion 10/23 EEG:  10/23 ACTH stim test normal  INDWELLING DEVICES: ETT 10/19 >>  Right IJ 10/19 >>   MICRO DATA: Blood 10/18 >> NEG MRSA PCR 10/19 >> NEG Urine 10/19 >> NEG Resp 10/19 >> normal flora  ANTIMICROBIALS:  Azithro 10/18 >> 10/19 Ceftriaxone 10/18 >> 10/19 Vanc 10/19 >> 10/20 Pp-tazo 10/19 >>    SUBJ: RASS -4. Not F/C. Minimal "twitching" off midaz infusion  VITAL SIGNS: BP (!) 114/51   Pulse (!) 115   Temp 99.3 F (37.4 C) (Oral)   Resp (!) 21   Ht 5\' 6"  (1.676 m)   Wt 155 lb 10.3 oz (70.6 kg)   SpO2 90%   BMI 25.12 kg/m   HEMODYNAMICS: CVP:  [12 mmHg-15 mmHg] 12 mmHg  VENTILATOR SETTINGS: Vent Mode: PRVC FiO2 (%):  [40 %-60 %] 40 % Set Rate:  [15 bmp-26 bmp] 15 bmp Vt Set:  [450 mL-500 mL] 500 mL PEEP:  [5 cmH20] 5 cmH20  INTAKE / OUTPUT: I/O last 3 completed shifts: In: 3669.8 [I.V.:1334.8; NG/GT:1775; IV  Piggyback:560] Out: 1910 [Urine:1910]  PHYSICAL EXAMINATION: General: RASS -4. Not F/C Neuro:  PERRL, No spontaneous movement, DTRs symmetric HEENT:  Supple, no JVD noted Cardiovascular: reg, no M Lungs: prolonged expiratory phase, no wheezes, BS distant Abdomen: soft, + BS, no overt tenderness, severe scrotal and penile edema Ext: 1+ symmetric ankle edema  LABS:  BMET  Recent Labs Lab 04/16/16 0454 04/17/16 0153 04/18/16 0448  NA 145 145 144  K 4.1 4.0 4.2  CL 106 103 104  CO2 35* 34* 34*  BUN 45* 50* 55*  CREATININE 1.53* 1.73* 1.93*  GLUCOSE 132* 178* 192*    Electrolytes  Recent Labs Lab 04/14/16 1103 04/15/16 0508 04/15/16 1736 04/16/16 0454 04/17/16 0153 04/18/16 0448  CALCIUM 7.8* 7.6* 7.7* 7.7* 7.8* 7.9*  MG 2.3 2.0 2.1  --   --  2.4  PHOS 5.8* 3.6  --   --   --  3.9    CBC  Recent Labs Lab 04/16/16 0454 04/17/16 0153 04/18/16 0448  WBC 10.0 10.4 8.9  HGB 10.5* 9.7* 9.0*  HCT 32.5* 30.8* 27.7*  PLT 85* 84* 91*    Coag's  Recent Labs Lab 04/04/2016 1332 04/15/16 1108  APTT 29  --   INR 1.05 1.29    Sepsis Markers  Recent Labs Lab 04/21/2016 1332 04/14/16 1103 04/14/16 1713 04/15/16 0508 04/16/16 0454  LATICACIDVEN 1.9 2.3* 1.1  --   --   PROCALCITON  --  0.16  --  0.34 0.38  ABG  Recent Labs Lab 04/14/16 1756 04/15/16 0412 04/17/16 0443  PHART 7.34* 7.31* 7.33*  PCO2ART 76* 80* 73*  PO2ART 56* 84 86    Liver Enzymes  Recent Labs Lab Sep 25, 2015 1332 04/14/16 1103  AST 29 33  ALT 19 17  ALKPHOS 85 87  BILITOT 0.8 1.0  ALBUMIN 3.3* 2.9*    Cardiac Enzymes  Recent Labs Lab Sep 25, 2015 1827 04/14/16 1103 04/14/16 1713  TROPONINI 0.05* 0.05* 0.11*    Glucose  Recent Labs Lab 04/17/16 1557 04/17/16 2002 04/18/16 0024 04/18/16 0445 04/18/16 0712 04/18/16 1118  GLUCAP 135* 122* 141* 153* 147* 146*    CXR: NNF   ASSESSMENT / PLAN:  PULMONARY A: Acute on chronic hypoxic/hypercarbic respiratory  failure Severe baseline COPD with chronic O2 dependence PNA, NOS  P:   Cont full vent support - settings reviewed and/or adjusted Cont vent bundle Daily SBT if/when meets criteria  CARDIOVASCULAR A:  Shock of unclear etiology  CHF with preserved EF Cor pulmonale  H/O CAD P:  Wean NE to off for MAP > 65 mmHg Monitor hemodynamics  RENAL A:   AKI, nonooliguric CKD Mild hypernatremia Hypervolemia P:   Monitor BMET intermittently Monitor I/Os Correct electrolytes as indicated Free water initiated 10/23  GASTROINTESTINAL A:   No acute issues P:   SUP: enteral famotidine Cont TFs  HEMATOLOGIC A:   Anemia without acute blood loss P:  DVT px: SQ heparin Monitor CBC intermittently Transfuse per usual guidelines  INFECTIOUS A:   PNA, NOS P:   Monitor temp, WBC count Micro and abx as above Recehck PCT AM 10/24 - consider DC abx  ENDOCRINE A:   Mild hyperglycemia  P:   CBGs q 8 hrs Consider resuming SSI if glu > 180   NEUROLOGIC A:   Acute encephalopathy of critical illness P:   RASS goal: 0, -1 Change continuous infusion to PRN fentanyl and lorazepam  Wife updated @ bedside. Confirmed DNR status. We agreed to no escalation from his current level of care and we will re-convene later in the week to discuss his status. He is not a candidate for trach and long term vent per his previous Living Will. If no improvement in the next couple of days, will discuss vent withdrawal  CCM X 45 mins  Billy Fischeravid Leea Rambeau, MD PCCM service Mobile [phone] Pager [phone] 04/18/2016

## 2016-04-18 NOTE — Progress Notes (Signed)
Subjective: Patient remains intubated and sedated.  Off Versed with no further jerking noted.    Objective: Current vital signs: BP (!) 114/58   Pulse (!) 116   Temp 99.3 F (37.4 C) (Oral)   Resp 17   Ht 5' 6"  (1.676 m)   Wt 70.6 kg (155 lb 10.3 oz)   SpO2 92%   BMI 25.12 kg/m  Vital signs in last 24 hours: Temp:  [98.5 F (36.9 C)-100.6 F (38.1 C)] 99.3 F (37.4 C) (10/23 1200) Pulse Rate:  [82-116] 116 (10/23 1245) Resp:  [17-27] 17 (10/23 1245) BP: (83-115)/(43-64) 114/58 (10/23 1245) SpO2:  [85 %-98 %] 92 % (10/23 1245) FiO2 (%):  [40 %-60 %] 40 % (10/23 1139) Weight:  [70.6 kg (155 lb 10.3 oz)] 70.6 kg (155 lb 10.3 oz) (10/23 0459)  Intake/Output from previous day: 10/22 0701 - 10/23 0700 In: 2372.1 [I.V.:837.1; NG/GT:1175; IV Piggyback:360] Out: 5170 [Urine:1390] Intake/Output this shift: Total I/O In: -  Out: 535 [Urine:535] Nutritional status:    Neurologic Exam: Mental Status: Patient does not respond to verbal stimuli.  Does not respond to deep sternal rub.  Does not follow commands.  No verbalizations noted.  Cranial Nerves: II: patient does not respond confrontation bilaterally, pupils right 3 mm, left 3 mm,and reactive bilaterally III,IV,VI: doll's response present bilaterally. V,VII: corneal reflex reduced bilaterally  VIII: patient does not respond to verbal stimuli IX,X: gag reflex reduced, XI: trapezius strength unable to test bilaterally XII: tongue strength unable to test Motor: Extremities flaccid throughout.  No spontaneous movement noted.  No purposeful movements noted. Sensory: Does not respond to noxious stimuli in any extremity. Deep Tendon Reflexes:  2+ throughout. Plantars: Mute bilaterally Cerebellar: Unable to perform   Lab Results: Basic Metabolic Panel:  Recent Labs Lab 04/14/16 1103  04/15/16 0508 04/15/16 1736 04/15/16 2042 04/15/16 2350 04/16/16 0454 04/17/16 0153 04/18/16 0448  NA 146*  --  146* 147*  --   --   145 145 144  K 5.2*  < > 3.5 3.1* 3.1* 3.2* 4.1 4.0 4.2  CL 107  --  105 105  --   --  106 103 104  CO2 30  --  36* 36*  --   --  35* 34* 34*  GLUCOSE 133*  --  181* 109*  --   --  132* 178* 192*  BUN 54*  --  54* 50*  --   --  45* 50* 55*  CREATININE 2.16*  --  1.86* 1.78*  --   --  1.53* 1.73* 1.93*  CALCIUM 7.8*  --  7.6* 7.7*  --   --  7.7* 7.8* 7.9*  MG 2.3  --  2.0 2.1  --   --   --   --  2.4  PHOS 5.8*  --  3.6  --   --   --   --   --  3.9  < > = values in this interval not displayed.  Liver Function Tests:  Recent Labs Lab 04/19/2016 1332 04/14/16 1103  AST 29 33  ALT 19 17  ALKPHOS 85 87  BILITOT 0.8 1.0  PROT 7.6 6.5  ALBUMIN 3.3* 2.9*    Recent Labs Lab 04/22/2016 1332  LIPASE 45   No results for input(s): AMMONIA in the last 168 hours.  CBC:  Recent Labs Lab 04/07/2016 1332 04/14/16 1103 04/15/16 1108 04/16/16 0454 04/17/16 0153 04/18/16 0448  WBC 8.3 13.0* 12.6* 10.0 10.4 8.9  NEUTROABS 7.5* 11.4*  --   --   --   --  HGB 12.2* 11.4* 10.4* 10.5* 9.7* 9.0*  HCT 39.4* 39.2* 33.6* 32.5* 30.8* 27.7*  MCV 89.6 94.7 88.7 87.7 88.4 87.3  PLT 101* 113* 87* 85* 84* 91*    Cardiac Enzymes:  Recent Labs Lab 04/19/2016 1332 04/10/2016 1827 04/14/16 1103 04/14/16 1713  CKTOTAL  --   --  226  --   TROPONINI 0.04* 0.05* 0.05* 0.11*    Lipid Panel: No results for input(s): CHOL, TRIG, HDL, CHOLHDL, VLDL, LDLCALC in the last 168 hours.  CBG:  Recent Labs Lab 04/17/16 2002 04/18/16 0024 04/18/16 0445 04/18/16 0712 04/18/16 1118  GLUCAP 122* 141* 153* 147* 146*    Microbiology: Results for orders placed or performed during the hospital encounter of 04/04/2016  Blood Culture (routine x 2)     Status: None   Collection Time: 04/15/2016  1:31 PM  Result Value Ref Range Status   Specimen Description BLOOD LEFT HAND  Final   Special Requests   Final    BOTTLES DRAWN AEROBIC AND ANAEROBIC AER 5ML ANA 4ML   Culture NO GROWTH 5 DAYS  Final   Report Status  04/18/2016 FINAL  Final  Blood Culture (routine x 2)     Status: None   Collection Time: 04/06/2016  1:32 PM  Result Value Ref Range Status   Specimen Description BLOOD RIGHT AC  Final   Special Requests   Final    BOTTLES DRAWN AEROBIC AND ANAEROBIC AER 3ML ANA 1ML   Culture NO GROWTH 5 DAYS  Final   Report Status 04/18/2016 FINAL  Final  Culture, blood (routine x 2) Call MD if unable to obtain prior to antibiotics being given     Status: None   Collection Time: 04/10/2016  6:27 PM  Result Value Ref Range Status   Specimen Description BLOOD RIGHT HAND  Final   Special Requests   Final    BOTTLES DRAWN AEROBIC AND ANAEROBIC [place]   Culture NO GROWTH 5 DAYS  Final   Report Status 04/18/2016 FINAL  Final  Culture, blood (routine x 2) Call MD if unable to obtain prior to antibiotics being given     Status: None   Collection Time: 03/27/2016  6:37 PM  Result Value Ref Range Status   Specimen Description BLOOD LEFT HAND  Final   Special Requests BOTTLES DRAWN AEROBIC AND ANAEROBIC [place]  Final   Culture NO GROWTH 5 DAYS  Final   Report Status 04/18/2016 FINAL  Final  MRSA PCR Screening     Status: None   Collection Time: 04/14/16  9:27 AM  Result Value Ref Range Status   MRSA by PCR NEGATIVE NEGATIVE Final    Comment:        The GeneXpert MRSA Assay (FDA approved for NASAL specimens only), is one component of a comprehensive MRSA colonization surveillance program. It is not intended to diagnose MRSA infection nor to guide or monitor treatment for MRSA infections.   Culture, respiratory (NON-Expectorated)     Status: None   Collection Time: 04/14/16 10:02 AM  Result Value Ref Range Status   Specimen Description TRACHEAL ASPIRATE  Final   Special Requests Normal  Final   Gram Stain   Final    MODERATE WBC PRESENT, PREDOMINANTLY PMN FEW SQUAMOUS EPITHELIAL CELLS PRESENT MODERATE GRAM POSITIVE COCCI IN PAIRS AND CHAINS FEW GRAM POSITIVE COCCI IN CLUSTERS FEW GRAM  NEGATIVE RODS FEW GRAM POSITIVE RODS RARE YEAST    Culture   Final    Consistent with normal respiratory  flora. Performed at [facility]    Report Status 04/16/2016 FINAL  Final  Urine culture     Status: None   Collection Time: 04/14/16 10:47 AM  Result Value Ref Range Status   Specimen Description URINE, RANDOM  Final   Special Requests NONE  Final   Culture   Final    NO GROWTH 1 DAY Performed at [facility]    Report Status 04/15/2016 FINAL  Final    Coagulation Studies: No results for input(s): LABPROT, INR in the last 72 hours.  Imaging: Dg Chest 1 View  Result Date: 04/17/2016 CLINICAL DATA:  Dyspnea EXAM: CHEST 1 VIEW COMPARISON:  04/16/2016 FINDINGS: Cardiac shadow is stable. Endotracheal tube, nasogastric catheter and right jugular central line are again seen and stable. The lungs are well aerated bilaterally. Persistent left pleural effusion and left basilar consolidation is seen. No new focal abnormality is noted. IMPRESSION: No significant interval change from the prior exam. Electronically Signed   By: Inez Catalina M.D.   On: 04/17/2016 07:48   Ct Chest Wo Contrast  Result Date: 04/17/2016 CLINICAL DATA:  75 yo male with a PMH of Pneumonia, Diastolic dysfunction (Echo 09/2014 EF 50-55%), COPD, Former smoker (1 to 1.5 PPD for 40 yrs.), Chronic O2 3L via nasal cannula, CAD, PCI 2016, BPH, and Allergic Rhinitis. The respiratory failure. Intubation. Septic shock. EXAM: CT CHEST WITHOUT CONTRAST TECHNIQUE: Multidetector CT imaging of the chest was performed following the standard protocol without IV contrast. COMPARISON:  04/17/2016 FINDINGS: Cardiovascular: Heart size is normal. Extensive calcification of the mitral annulus. Coronary artery calcifications are present. Trace pericardial effusion. Extensive atherosclerotic calcification of the thoracic aorta. No aneurysm. Mediastinum/Nodes: The visualized portion of the thyroid gland has a normal appearance.  Left paratracheal lymph node is 1.8 cm. Precarinal lymph node is 2.5 cm. Azygoesophageal a recess known is 2.3 cm. Nasogastric tube is in place in the esophagus. Lungs/Pleura: There are bilateral pleural effusions. Emphysematous changes are identified in the upper lobes. There patchy infiltrates involving the left upper lobe and both lower lobes and to a lesser extent, the right upper lobe. Findings favor infectious infiltrates. No suspicious pulmonary nodules. Endotracheal tube is in place, tip above the carina. Upper Abdomen: Nasogastric tube to the stomach. Small amount of contrast identified within the stomach. There is atherosclerotic calcification of the abdominal aorta. Musculoskeletal: Mild degenerative changes identified in the mid thoracic spine. IMPRESSION: 1. Multifocal infiltrates and bilateral pleural effusions. 2. Mediastinal adenopathy most likely reactive in the setting of infiltrates. 3. Coronary artery disease. 4. Nasogastric tube and endotracheal tube. Electronically Signed   By: Nolon Nations M.D.   On: 04/17/2016 10:26   US Venous Img Lower Bilateral  Result Date: 04/17/2016 CLINICAL DATA:  Hypoxia, shortness of breath, recent injury, fall, pain EXAM: BILATERAL LOWER EXTREMITY VENOUS DOPPLER ULTRASOUND TECHNIQUE: Gray-scale sonography with graded compression, as well as color Doppler and duplex ultrasound were performed to evaluate the lower extremity deep venous systems from the level of the common femoral vein and including the common femoral, femoral, profunda femoral, popliteal and calf veins including the posterior tibial, peroneal and gastrocnemius veins when visible. The superficial great saphenous vein was also interrogated. Spectral Doppler was utilized to evaluate flow at rest and with distal augmentation maneuvers in the common femoral, femoral and popliteal veins. COMPARISON:  None. FINDINGS: RIGHT LOWER EXTREMITY Common Femoral Vein: No evidence of thrombus. Normal  compressibility, respiratory phasicity and response to augmentation. Saphenofemoral Junction: No evidence of thrombus.  Normal compressibility and flow on color Doppler imaging. Profunda Femoral Vein: No evidence of thrombus. Normal compressibility and flow on color Doppler imaging. Femoral Vein: No evidence of thrombus. Normal compressibility, respiratory phasicity and response to augmentation. Popliteal Vein: No evidence of thrombus. Normal compressibility, respiratory phasicity and response to augmentation. Calf Veins: No evidence of thrombus. Normal compressibility and flow on color Doppler imaging. Superficial Great Saphenous Vein: No evidence of thrombus. Normal compressibility and flow on color Doppler imaging. Venous Reflux:  None. Other Findings:  None. LEFT LOWER EXTREMITY Common Femoral Vein: No evidence of thrombus. Normal compressibility, respiratory phasicity and response to augmentation. Saphenofemoral Junction: No evidence of thrombus. Normal compressibility and flow on color Doppler imaging. Profunda Femoral Vein: No evidence of thrombus. Normal compressibility and flow on color Doppler imaging. Femoral Vein: No evidence of thrombus. Normal compressibility, respiratory phasicity and response to augmentation. Popliteal Vein: No evidence of thrombus. Normal compressibility, respiratory phasicity and response to augmentation. Calf Veins: No evidence of thrombus. Normal compressibility and flow on color Doppler imaging. Superficial Great Saphenous Vein: No evidence of thrombus. Normal compressibility and flow on color Doppler imaging. Venous Reflux:  None. Other Findings:  None. IMPRESSION: No evidence of deep venous thrombosis. Electronically Signed   By: Jerilynn Mages.  Shick M.D.   On: 04/17/2016 11:15    Medications:  I have reviewed the patient's current medications. Scheduled: . aspirin  81 mg Per Tube Daily  . budesonide (PULMICORT) nebulizer solution  0.25 mg Nebulization Q6H  . chlorhexidine  gluconate (MEDLINE KIT)  15 mL Mouth Rinse BID  . famotidine  20 mg Per Tube QHS  . free water  200 mL Per Tube Q8H  . heparin subcutaneous  5,000 Units Subcutaneous Q8H  . insulin aspart  0-15 Units Subcutaneous Q4H  . ipratropium-albuterol  3 mL Nebulization Q6H  . levETIRAcetam  500 mg Intravenous Q12H  . mouth rinse  15 mL Mouth Rinse 10 times per day  . piperacillin-tazobactam (ZOSYN)  IV  3.375 g Intravenous Q8H  . polyethylene glycol  17 g Per Tube Daily  . senna-docusate  1 tablet Oral BID  . sodium chloride flush  10-40 mL Intracatheter Q12H    Assessment/Plan: No further jerking noted.  Remains unresponsive but also on Fentanyl.  Pulmonary issues remain paramount.  Head CT performed on 10/20 shows no acute changes.  Renal function below baseline.    Recommendations: 1.  EEG pending.     LOS: 5 days   Alexis Goodell, MD Neurology [phone] 04/18/2016  1:06 PM

## 2016-04-18 NOTE — Progress Notes (Signed)
Pharmacy Progress Note  Keith Watts is a 75 y.o. male admitted on 11-May-2016 with pneumonia. Patient not responding to sternal rub 10/18 transferred to ICU and subsequently intubated and sedated, requiring pressors.   Plan: Continue piperacillin/tazobactam EI 3.375g IV Q8hr.     Height: 5\' 6"  (167.6 cm) Weight: 155 lb 10.3 oz (70.6 kg) IBW/kg (Calculated) : 63.8  Temp (24hrs), Avg:99.3 F (37.4 C), Min:98.4 F (36.9 C), Max:100.6 F (38.1 C)   Recent Labs Lab 2016-06-01 1332  04/14/16 1103 04/14/16 1713 04/15/16 0508 04/15/16 1108 04/15/16 1736 04/16/16 0454 04/17/16 0153 04/18/16 0448  WBC 8.3  --  13.0*  --   --  12.6*  --  10.0 10.4 8.9  CREATININE 1.58*  < > 2.16*  --  1.86*  --  1.78* 1.53* 1.73* 1.93*  LATICACIDVEN 1.9  --  2.3* 1.1  --   --   --   --   --   --   < > = values in this interval not displayed.  Estimated Creatinine Clearance: 30.3 mL/min (by C-G formula based on SCr of 1.93 mg/dL (H)).    No Known Allergies  Antimicrobials this admission: ceftriaxone 10/18 >>  10/19 azithromycin 10/18 >>  10/19 Vancomycin 10/19 >> 10/19   Zosyn 10/19 >>  Dose adjustments this admission:  Microbiology results: 10/19: MRSA PCR: negative 10/18 BCx: no growth  10/18 UCx: no growth  10/18 Sputum: consistent with normal respiratory flora     Pharmacy will continue to monitor and adjust per consult.   Keith Watts L 04/18/2016 9:14 AM

## 2016-04-19 ENCOUNTER — Inpatient Hospital Stay: Payer: Medicare Other

## 2016-04-19 LAB — GLUCOSE, CAPILLARY
GLUCOSE-CAPILLARY: 135 mg/dL — AB (ref 65–99)
GLUCOSE-CAPILLARY: 131 mg/dL — AB (ref 65–99)
GLUCOSE-CAPILLARY: 144 mg/dL — AB (ref 65–99)
Glucose-Capillary: 152 mg/dL — ABNORMAL HIGH (ref 65–99)
Glucose-Capillary: 148 mg/dL — ABNORMAL HIGH (ref 65–99)

## 2016-04-19 LAB — COMPREHENSIVE METABOLIC PANEL
ALK PHOS: 84 U/L (ref 38–126)
ALT: 18 U/L (ref 17–63)
ANION GAP: 8 (ref 5–15)
AST: 30 U/L (ref 15–41)
Albumin: 1.9 g/dL — ABNORMAL LOW (ref 3.5–5.0)
BUN: 59 mg/dL — ABNORMAL HIGH (ref 6–20)
CALCIUM: 8.5 mg/dL — AB (ref 8.9–10.3)
CHLORIDE: 105 mmol/L (ref 101–111)
CO2: 35 mmol/L — AB (ref 22–32)
Creatinine, Ser: 1.98 mg/dL — ABNORMAL HIGH (ref 0.61–1.24)
GFR, EST AFRICAN AMERICAN: 37 mL/min — AB (ref >60)
GFR, EST NON AFRICAN AMERICAN: 32 mL/min — AB (ref >60)
Glucose, Bld: 147 mg/dL — ABNORMAL HIGH (ref 65–99)
Potassium: 4.1 mmol/L (ref 3.5–5.1)
SODIUM: 148 mmol/L — AB (ref 135–145)
Total Bilirubin: 0.6 mg/dL (ref 0.3–1.2)
Total Protein: 6.1 g/dL — ABNORMAL LOW (ref 6.5–8.1)

## 2016-04-19 LAB — CBC
HCT: 27.5 % — ABNORMAL LOW (ref 40.0–52.0)
Hemoglobin: 8.8 g/dL — ABNORMAL LOW (ref 13.0–18.0)
MCH: 28.5 pg (ref 26.0–34.0)
MCHC: 32.1 g/dL (ref 32.0–36.0)
MCV: 88.7 fL (ref 80.0–100.0)
PLATELETS: 101 10*3/uL — AB (ref 150–440)
RBC: 3.1 MIL/uL — ABNORMAL LOW (ref 4.40–5.90)
RDW: 16.5 % — ABNORMAL HIGH (ref 11.5–14.5)
WBC: 9.2 10*3/uL (ref 3.8–10.6)

## 2016-04-19 LAB — PROCALCITONIN: Procalcitonin: 0.71 ng/mL

## 2016-04-19 MED ORDER — FREE WATER
200.0000 mL | Status: DC
  Administered 2016-04-19 – 2016-04-20 (×5): 200 mL

## 2016-04-19 NOTE — Progress Notes (Addendum)
PULMONARY / CRITICAL CARE MEDICINE   Name: Keith Watts MRN: [id number] DOB: 10-02-1940    ADMISSION DATE:  04/06/2016 CONSULTATION DATE:  04/14/2016  REFERRING MD:  Dr. Elisabeth Pigeon  Patient profile: 69 M with severe COPD, chronic O2 dependence, dCHF, presented to [facility] ER on 10/18 and admitted to the [facility] unit. On 10/19  rapid response was initiated for unresponsiveness. Therefore, intubated and transferred to ICU where PCCM assumed care.  MAJOR EVENTS/TEST RESULTS: 10/18 CT head: No acute intracranial abnormality.  10/19 TTE: The cavity size was moderately dilated. Mild-mod AS. Estimated EF 70%. RV dilatation  10/20 CT head: No acute intracranial abnormality 10/20 Cardiology consultation 10/21 Neurology consultation. Doubt seizures. EEG ordered. Continue Keppra. Low dose midaz infusion ordered 10/22 CT chest: Multifocal infiltrates and bilateral pleural effusions. Mediastinal adenopathy most likely reactive in the setting of infiltrates. No PE 10/22 LE venous US: no DVT 10/23 Midaz infusion D/C'd. Remains on norepinephrine infusion 10/23 EEG: diffuse slowing, nonspecific 10/23 ACTH stim test normal 10/24 Remains on NE infusion. Increasing O2 reqt. Vent adjustments made. Resp culture ordered 10/24 Wife updated @ bedisde. She wishes to have family meeting 10/25 @ 1230  INDWELLING DEVICES: ETT 10/19 >>  Right IJ 10/19 >>   MICRO DATA: Blood 10/18 >> NEG MRSA PCR 10/19 >> NEG Urine 10/19 >> NEG Resp 10/19 >> normal flora Resp 10/24 >>   ANTIMICROBIALS:  Azithro 10/18 >> 10/19 Ceftriaxone 10/18 >> 10/19 Vanc 10/19 >> 10/20 Pp-tazo 10/19 >>    SUBJ: RASS -4. Not F/C. Minimal spontaneous movement  VITAL SIGNS: BP (!) 103/55   Pulse 99   Temp 98.3 F (36.8 C) (Axillary)   Resp 19   Ht 5\' 6"  (1.676 m)   Wt 154 lb 1.6 oz (69.9 kg)   SpO2 98%   BMI 24.87 kg/m   HEMODYNAMICS: CVP:  [3 mmHg-16 mmHg] 3 mmHg  VENTILATOR SETTINGS: Vent Mode: PRVC FiO2 (%):  [40  %-65 %] 65 % Set Rate:  [15 bmp] 15 bmp Vt Set:  [500 mL] 500 mL PEEP:  [5 cmH20] 5 cmH20 Plateau Pressure:  [23 cmH20] 23 cmH20  INTAKE / OUTPUT: I/O last 3 completed shifts: In: 3692.5 [I.V.:1017.5; NG/GT:1955; IV Piggyback:720] Out: 2435 [Urine:2435]  PHYSICAL EXAMINATION: General: RASS -4. Not F/C Neuro:  PERRL, No spontaneous movement, DTRs symmetric HEENT:  Supple, no JVD noted Cardiovascular: reg, no M Lungs: prolonged expiratory phase, acattered wheezes Abdomen: soft, + BS, no overt tenderness, severe scrotal and penile edema Ext: 1+ symmetric ankle edema  LABS:  BMET  Recent Labs Lab 04/17/16 0153 04/18/16 0448 04/19/16 0552  NA 145 144 148*  K 4.0 4.2 4.1  CL 103 104 105  CO2 34* 34* 35*  BUN 50* 55* 59*  CREATININE 1.73* 1.93* 1.98*  GLUCOSE 178* 192* 147*    Electrolytes  Recent Labs Lab 04/14/16 1103 04/15/16 0508 04/15/16 1736  04/17/16 0153 04/18/16 0448 04/19/16 0552  CALCIUM 7.8* 7.6* 7.7*  < > 7.8* 7.9* 8.5*  MG 2.3 2.0 2.1  --   --  2.4  --   PHOS 5.8* 3.6  --   --   --  3.9  --   < > = values in this interval not displayed.  CBC  Recent Labs Lab 04/17/16 0153 04/18/16 0448 04/19/16 0552  WBC 10.4 8.9 9.2  HGB 9.7* 9.0* 8.8*  HCT 30.8* 27.7* 27.5*  PLT 84* 91* 101*    Coag's  Recent Labs Lab 04/01/2016 1332 04/15/16 1108  APTT 29  --   INR 1.05 1.29    Sepsis Markers  Recent Labs Lab 2015/08/24 1332  04/14/16 1103 04/14/16 1713 04/15/16 0508 04/16/16 0454 04/19/16 0552  LATICACIDVEN 1.9  --  2.3* 1.1  --   --   --   PROCALCITON  --   < > 0.16  --  0.34 0.38 0.71  < > = values in this interval not displayed.  ABG  Recent Labs Lab 04/14/16 1756 04/15/16 0412 04/17/16 0443  PHART 7.34* 7.31* 7.33*  PCO2ART 76* 80* 73*  PO2ART 56* 84 86    Liver Enzymes  Recent Labs Lab 2015/08/24 1332 04/14/16 1103 04/19/16 0552  AST 29 33 30  ALT 19 17 18   ALKPHOS 85 87 84  BILITOT 0.8 1.0 0.6  ALBUMIN 3.3*  2.9* 1.9*    Cardiac Enzymes  Recent Labs Lab 2015/08/24 1827 04/14/16 1103 04/14/16 1713  TROPONINI 0.05* 0.05* 0.11*    Glucose  Recent Labs Lab 04/18/16 0445 04/18/16 0712 04/18/16 1118 04/19/16 0104 04/19/16 0415 04/19/16 0717  GLUCAP 153* 147* 146* 135* 131* 144*    CXR: Increased bilateral patchy infiltrates   ASSESSMENT / PLAN:  PULMONARY A: 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 Severe baseline COPD with chronic O2 dependence HCAP, NOS - worsening infiltrates on CXR P:   Cont full vent support - settings reviewed and/or adjusted Cont vent bundle Daily SBT if/when meets criteria reculture resp tract  CARDIOVASCULAR A:  Shock of unclear etiology  CHF with preserved EF Cor pulmonale  H/O CAD P:  Wean NE to off for MAP > 65 mmHg Monitor hemodynamics  RENAL A:   AKI, nonooliguric CKD Mild hypernatremia Hypervolemia P:   Monitor BMET intermittently Monitor I/Os Correct electrolytes as indicated Free water initiated 10/23, increased 10/24  GASTROINTESTINAL A:   No acute issues P:   SUP: enteral famotidine Cont TFs  HEMATOLOGIC A:   Anemia without acute blood loss P:  DVT px: SQ heparin Monitor CBC intermittently Transfuse per usual guidelines  INFECTIOUS A:   PNA, NOS P:   Monitor temp, WBC count Micro and abx as above  ENDOCRINE A:   Mild hyperglycemia  P:   CBGs q 8 hrs Consider resuming SSI if glu > 180   NEUROLOGIC A:   Acute encephalopathy of critical illness P:   RASS goal: 0, -1 Cont PRN fentanyl, lorazepam  Wife updated @ bedside. Family meeting planned for 10/25  CCM X 35 mins  Billy Fischeravid Rozlyn Yerby, MD PCCM service Mobile [phone] Pager [phone] 04/19/2016

## 2016-04-19 NOTE — Progress Notes (Signed)
PEEP increase to +8 per Dr. Bard HerbertSimmonds.

## 2016-04-19 NOTE — Progress Notes (Signed)
Abuse/neglect assessment incomplete at this time due to pt. condition.

## 2016-04-19 NOTE — Plan of Care (Signed)
Problem: Activity: Goal: Risk for activity intolerance will decrease Outcome: Not Progressing Patient remains on ventilator and not interacting with staff. Only non purposeful movements, intermittently   Problem: Nutrition: Goal: Adequate nutrition will be maintained Outcome: Progressing Patient on Vital 1.2 tube feeds at goal rate of 50 ml/hr

## 2016-04-19 NOTE — Progress Notes (Signed)
Pharmacy Progress Note  Keith Watts is a 75 y.o. male admitted on 04/21/2016 with pneumonia. Patient not responding to sternal rub 10/18 transferred to ICU and subsequently intubated and sedated, requiring pressors.   Plan: Continue piperacillin/tazobactam EI 3.375g IV Q8hr.  Patient is on day 6 of therapy. Will discuss planned duration of therapy although CXR worse per MD.     Height: 5\' 6"  (167.6 cm) Weight: 154 lb 1.6 oz (69.9 kg) IBW/kg (Calculated) : 63.8  Temp (24hrs), Avg:99 F (37.2 C), Min:98.3 F (36.8 C), Max:99.6 F (37.6 C)   Recent Labs Lab 04/01/2016 1332  04/14/16 1103 04/14/16 1713  04/15/16 1108 04/15/16 1736 04/16/16 0454 04/17/16 0153 04/18/16 0448 04/19/16 0552  WBC 8.3  --  13.0*  --   --  12.6*  --  10.0 10.4 8.9 9.2  CREATININE 1.58*  < > 2.16*  --   < >  --  1.78* 1.53* 1.73* 1.93* 1.98*  LATICACIDVEN 1.9  --  2.3* 1.1  --   --   --   --   --   --   --   < > = values in this interval not displayed.  Estimated Creatinine Clearance: 29.5 mL/min (by C-G formula based on SCr of 1.98 mg/dL (H)).    No Known Allergies  Antimicrobials this admission: ceftriaxone 10/18 >>  10/19 azithromycin 10/18 >>  10/19 Vancomycin 10/19 >> 10/19   Zosyn 10/19 >>  Dose adjustments this admission:  Microbiology results: 10/19: MRSA PCR: negative 10/18 BCx: no growth  10/18 UCx: no growth  10/18 Sputum: consistent with normal respiratory flora     Pharmacy will continue to monitor and adjust per consult.   Luisa HartChristy, Lyvia Mondesir D 04/19/2016 11:43 AM

## 2016-04-20 ENCOUNTER — Inpatient Hospital Stay: Payer: Medicare Other

## 2016-04-20 DIAGNOSIS — J449 Chronic obstructive pulmonary disease, unspecified: Secondary | ICD-10-CM

## 2016-04-20 DIAGNOSIS — I639 Cerebral infarction, unspecified: Secondary | ICD-10-CM

## 2016-04-20 LAB — GLUCOSE, CAPILLARY: GLUCOSE-CAPILLARY: 135 mg/dL — AB (ref 65–99)

## 2016-04-20 LAB — CBC
HEMATOCRIT: 26.5 % — AB (ref 40.0–52.0)
HEMOGLOBIN: 8.5 g/dL — AB (ref 13.0–18.0)
MCH: 28.2 pg (ref 26.0–34.0)
MCHC: 32 g/dL (ref 32.0–36.0)
MCV: 87.9 fL (ref 80.0–100.0)
Platelets: 118 10*3/uL — ABNORMAL LOW (ref 150–440)
RBC: 3.01 MIL/uL — AB (ref 4.40–5.90)
RDW: 16.6 % — ABNORMAL HIGH (ref 11.5–14.5)
WBC: 8.7 10*3/uL (ref 3.8–10.6)

## 2016-04-20 LAB — BASIC METABOLIC PANEL
ANION GAP: 7 (ref 5–15)
BUN: 60 mg/dL — ABNORMAL HIGH (ref 6–20)
CALCIUM: 8.9 mg/dL (ref 8.9–10.3)
CO2: 37 mmol/L — AB (ref 22–32)
Chloride: 101 mmol/L (ref 101–111)
Creatinine, Ser: 1.97 mg/dL — ABNORMAL HIGH (ref 0.61–1.24)
GFR, EST AFRICAN AMERICAN: 37 mL/min — AB (ref >60)
GFR, EST NON AFRICAN AMERICAN: 32 mL/min — AB (ref >60)
Glucose, Bld: 147 mg/dL — ABNORMAL HIGH (ref 65–99)
POTASSIUM: 4.2 mmol/L (ref 3.5–5.1)
Sodium: 145 mmol/L (ref 135–145)

## 2016-04-20 MED ORDER — ACETAMINOPHEN 325 MG PO TABS: 650.0000 mg | ORAL_TABLET | ORAL | Status: DC | PRN

## 2016-04-20 MED ORDER — FUROSEMIDE 10 MG/ML IJ SOLN
40.0000 mg | Freq: Four times a day (QID) | INTRAMUSCULAR | Status: DC
  Administered 2016-04-20: 40 mg via INTRAVENOUS
  Filled 2016-04-20: qty 4

## 2016-04-20 MED ORDER — MORPHINE 100MG IN NS 100ML (1MG/ML) PREMIX INFUSION
10.0000 mg/h | INTRAVENOUS | Status: DC
  Filled 2016-04-20: qty 100

## 2016-04-20 MED ORDER — MORPHINE BOLUS VIA INFUSION
5.0000 mg | INTRAVENOUS | Status: DC | PRN
  Filled 2016-04-20: qty 20

## 2016-04-20 MED ORDER — ENOXAPARIN SODIUM 30 MG/0.3ML ~~LOC~~ SOLN
30.0000 mg | SUBCUTANEOUS | Status: DC
  Administered 2016-04-20: 30 mg via SUBCUTANEOUS
  Filled 2016-04-20: qty 0.3

## 2016-04-20 MED ORDER — FREE WATER
200.0000 mL | Freq: Four times a day (QID) | Status: DC
  Administered 2016-04-20: 200 mL

## 2016-04-27 NOTE — Progress Notes (Signed)
Pt. Was transported to MRI from CCU on the trilogy vent. He was then placed on the MRI compatible Servo I for the MRI. He was placed back on the trilogy vent to transport back to CCU.

## 2016-04-27 NOTE — Progress Notes (Signed)
Family gathered as Pt was detached from breathing maching. CH remainied while family observed the deceased's last breaths aoubt 1 hr later. CH offered prayer and read scripture. CH is available for further comfort.   04/25/2016 1440  Clinical Encounter Type  Visited With Patient and family together  Visit Type Follow-up;Death;Patient actively dying  Referral From Nurse  Spiritual Encounters  Spiritual Needs Prayer;Emotional;Grief support  Stress Factors  Family Stress Factors Loss

## 2016-04-27 NOTE — Progress Notes (Signed)
Pt belongings home with Pt's wife. CDS notified. Referral number [id number]. E-link notified.

## 2016-04-27 NOTE — Progress Notes (Signed)
Pharmacy Progress Note  Keith Watts Doescher is a 75 y.o. male admitted on 03/30/2016 with pneumonia. Patient not responding to sternal rub 10/18 transferred to ICU and subsequently intubated and sedated, requiring pressors.   Plan: Continue piperacillin/tazobactam EI 3.375g IV Q8hr.  Patient is on day 7 of therapy. Plan is to continue Zosyn for now with worsening infiltrates on CXR.   Height: 5\' 6"  (167.6 cm) Weight: 156 lb 1.4 oz (70.8 kg) IBW/kg (Calculated) : 63.8  Temp (24hrs), Avg:98.9 F (37.2 C), Min:98 F (36.7 C), Max:99.9 F (37.7 C)   Recent Labs Lab 04/15/2016 1332  04/14/16 1103 04/14/16 1713  04/16/16 0454 04/17/16 0153 04/18/16 0448 04/19/16 0552 2016/04/02 0419  WBC 8.3  --  13.0*  --   < > 10.0 10.4 8.9 9.2 8.7  CREATININE 1.58*  < > 2.16*  --   < > 1.53* 1.73* 1.93* 1.98* 1.97*  LATICACIDVEN 1.9  --  2.3* 1.1  --   --   --   --   --   --   < > = values in this interval not displayed.  Estimated Creatinine Clearance: 29.7 mL/min (by C-G formula based on SCr of 1.97 mg/dL (H)).    No Known Allergies  Antimicrobials this admission: ceftriaxone 10/18 >>  10/19 azithromycin 10/18 >>  10/19 Vancomycin 10/19 >> 10/19   Zosyn 10/19 >>  Dose adjustments this admission:  Microbiology results: TA: sent 10/19: MRSA PCR: negative 10/18 BCx: no growth  10/18 UCx: no growth  10/18 Sputum: consistent with normal respiratory flora     Pharmacy will continue to monitor and adjust per consult.   Luisa HartChristy, Francess Mullen D 09-06-2015 10:54 AM

## 2016-04-27 NOTE — Progress Notes (Signed)
Subjective: Patient remains intubated.  On no sedation.    Objective: Current vital signs: BP (!) 104/56   Pulse 99   Temp 97.8 F (36.6 C) (Axillary)   Resp (!) 22   Ht 5\' 6"  (1.676 m)   Wt 70.8 kg (156 lb 1.4 oz)   SpO2 92%   BMI 25.19 kg/m  Vital signs in last 24 hours: Temp:  [97.8 F (36.6 C)-99.9 F (37.7 C)] 97.8 F (36.6 C) (10/25 1200) Pulse Rate:  [52-120] 99 (10/25 1300) Resp:  [17-23] 22 (10/25 1300) BP: (98-119)/(51-61) 104/56 (10/25 1300) SpO2:  [88 %-97 %] 92 % (10/25 1300) FiO2 (%):  [60 %] 60 % (10/25 1200) Weight:  [70.8 kg (156 lb 1.4 oz)] 70.8 kg (156 lb 1.4 oz) (10/25 0400)  Intake/Output from previous day: 10/24 0701 - 10/25 0700 In: 2640.8 [I.V.:609.1; NG/GT:1671.7; IV Piggyback:360] Out: 1650 [Urine:1650] Intake/Output this shift: Total I/O In: 508.3 [I.V.:155; NG/GT:353.3] Out: 300 [Urine:300] Nutritional status:    Neurologic Exam: Mental Status: Patient does not respond to verbal stimuli.  With deep sternal rub localizes to pain with the RUE.  Does not follow commands.  No verbalizations noted.  Cranial Nerves: II: patient does not respond confrontation bilaterally, pupils right 4 mm, left 4 mm,and reactive bilaterally III,IV,VI: doll's response present bilaterally.  V,VII: corneal reflex present bilaterally  VIII: patient does not respond to verbal stimuli IX,X: gag reflex reduced, XI: trapezius strength unable to test bilaterally XII: tongue strength unable to test Motor: Extremities flaccid throughout.  Some spontaneous movement noted of the LLE.  No movement noted of the LUE Sensory: Withdraws to painful stimuli in the lower extremities and in the RUE.  No withdrawal in the LUE.   Deep Tendon Reflexes:  2+ throughout. Plantars: upgoing bilaterally Cerebellar: Unable to perform    Lab Results: Basic Metabolic Panel:  Recent Labs Lab 04/14/16 1103  04/15/16 0508 04/15/16 1736  04/16/16 0454 04/17/16 0153 04/18/16 0448  04/19/16 0552 04/12/2016 0419  NA 146*  --  146* 147*  --  145 145 144 148* 145  K 5.2*  < > 3.5 3.1*  < > 4.1 4.0 4.2 4.1 4.2  CL 107  --  105 105  --  106 103 104 105 101  CO2 30  --  36* 36*  --  35* 34* 34* 35* 37*  GLUCOSE 133*  --  181* 109*  --  132* 178* 192* 147* 147*  BUN 54*  --  54* 50*  --  45* 50* 55* 59* 60*  CREATININE 2.16*  --  1.86* 1.78*  --  1.53* 1.73* 1.93* 1.98* 1.97*  CALCIUM 7.8*  --  7.6* 7.7*  --  7.7* 7.8* 7.9* 8.5* 8.9  MG 2.3  --  2.0 2.1  --   --   --  2.4  --   --   PHOS 5.8*  --  3.6  --   --   --   --  3.9  --   --   < > = values in this interval not displayed.  Liver Function Tests:  Recent Labs Lab 04/14/16 1103 04/19/16 0552  AST 33 30  ALT 17 18  ALKPHOS 87 84  BILITOT 1.0 0.6  PROT 6.5 6.1*  ALBUMIN 2.9* 1.9*   No results for input(s): LIPASE, AMYLASE in the last 168 hours. No results for input(s): AMMONIA in the last 168 hours.  CBC:  Recent Labs Lab 04/14/16 1103  04/16/16 0454 04/17/16  0153 04/18/16 0448 04/19/16 0552 05/20/16 0419  WBC 13.0*  < > 10.0 10.4 8.9 9.2 8.7  NEUTROABS 11.4*  --   --   --   --   --   --   HGB 11.4*  < > 10.5* 9.7* 9.0* 8.8* 8.5*  HCT 39.2*  < > 32.5* 30.8* 27.7* 27.5* 26.5*  MCV 94.7  < > 87.7 88.4 87.3 88.7 87.9  PLT 113*  < > 85* 84* 91* 101* 118*  < > = values in this interval not displayed.  Cardiac Enzymes:  Recent Labs Lab 04/19/2016 1827 04/14/16 1103 04/14/16 1713  CKTOTAL  --  226  --   TROPONINI 0.05* 0.05* 0.11*    Lipid Panel: No results for input(s): CHOL, TRIG, HDL, CHOLHDL, VLDL, LDLCALC in the last 168 hours.  CBG:  Recent Labs Lab 04/19/16 0415 04/19/16 0717 04/19/16 1128 04/19/16 1604 05-20-16 0011  GLUCAP 131* 144* 152* 148* 135*    Microbiology: Results for orders placed or performed during the hospital encounter of 04/04/2016  Blood Culture (routine x 2)     Status: None   Collection Time: 04/18/2016  1:31 PM  Result Value Ref Range Status   Specimen  Description BLOOD LEFT HAND  Final   Special Requests   Final    BOTTLES DRAWN AEROBIC AND ANAEROBIC AER ANA   Culture NO GROWTH 5 DAYS  Final   Report Status 04/18/2016 FINAL  Final  Blood Culture (routine x 2)     Status: None   Collection Time: 04/19/2016  1:32 PM  Result Value Ref Range Status   Specimen Description BLOOD RIGHT AC  Final   Special Requests   Final    BOTTLES DRAWN AEROBIC AND ANAEROBIC AER ANA   Culture NO GROWTH 5 DAYS  Final   Report Status 04/18/2016 FINAL  Final  Culture, blood (routine x 2) Call MD if unable to obtain prior to antibiotics being given     Status: None   Collection Time: 03/30/2016  6:27 PM  Result Value Ref Range Status   Specimen Description BLOOD RIGHT HAND  Final   Special Requests   Final    BOTTLES DRAWN AEROBIC AND ANAEROBIC 10CCAERO,10CCANA   Culture NO GROWTH 5 DAYS  Final   Report Status 04/18/2016 FINAL  Final  Culture, blood (routine x 2) Call MD if unable to obtain prior to antibiotics being given     Status: None   Collection Time: 04/16/2016  6:37 PM  Result Value Ref Range Status   Specimen Description BLOOD LEFT HAND  Final   Special Requests BOTTLES DRAWN AEROBIC AND ANAEROBIC 8CCAERO,8CCANA  Final   Culture NO GROWTH 5 DAYS  Final   Report Status 04/18/2016 FINAL  Final  MRSA PCR Screening     Status: None   Collection Time: 04/14/16  9:27 AM  Result Value Ref Range Status   MRSA by PCR NEGATIVE NEGATIVE Final    Comment:        The GeneXpert MRSA Assay (FDA approved for NASAL specimens only), is one component of a comprehensive MRSA colonization surveillance program. It is not intended to diagnose MRSA infection nor to guide or monitor treatment for MRSA infections.   Culture, respiratory (NON-Expectorated)     Status: None   Collection Time: 04/14/16 10:02 AM  Result Value Ref Range Status   Specimen Description TRACHEAL ASPIRATE  Final   Special Requests Normal  Final   Gram Stain  Final     MODERATE WBC PRESENT, PREDOMINANTLY PMN FEW SQUAMOUS EPITHELIAL CELLS PRESENT MODERATE GRAM POSITIVE COCCI IN PAIRS AND CHAINS FEW GRAM POSITIVE COCCI IN CLUSTERS FEW GRAM NEGATIVE RODS FEW GRAM POSITIVE RODS RARE YEAST    Culture   Final    Consistent with normal respiratory flora. [facility]    Report Status 04/16/2016 FINAL  Final  Urine culture     Status: None   Collection Time: 04/14/16 10:47 AM  Result Value Ref Range Status   Specimen Description URINE, RANDOM  Final   Special Requests NONE  Final   Culture   Final    NO GROWTH 1 DAY [facility]     Report Status 04/15/2016 FINAL  Final    Coagulation Studies: No results for input(s): LABPROT, INR in the last 72 hours.  Imaging: Mr Brain Wo Contrast  Result Date: 04/03/2016 CLINICAL DATA:  Coma.  COPD. EXAM: MRI HEAD WITHOUT CONTRAST TECHNIQUE: Multiplanar, multiecho pulse sequences of the brain and surrounding structures were obtained without intravenous contrast. COMPARISON:  Head CT from 5 days ago FINDINGS: Brain: Small, 5 mm or smaller, foci of restricted diffusion in the bilateral cerebral white matter, roughly in line with the lateral ventricles. No infratentorial or cortically based infarct is noted. No acute hemorrhage. There is chronic appearing FLAIR hyperintensity in the right more than left cerebral white matter, likely chronic ischemia from patient's right ICA disease. Mild small vessel ischemic change in the pons. Small remote left cerebellar infarct. No hydrocephalus or mass. Vascular: Loss of normal flow related signal in the right ICA at the skullbase and upper cervical levels. In the upper cervical portion the ICA appears collapse. Nonspecific prominence of suboccipital vessels bilaterally, likely veins. Skull and upper cervical spine: Negative Sinuses/Orbits: Right cataract resection. Nasopharyngeal fluid in the setting of intubation. IMPRESSION: 1. Small acute  infarcts in the bilateral cerebral white matter which could be embolic or deep watershed. 2. Asymmetric ischemic gliosis in the right cerebral white matter likely related to chronic right ICA occlusion. Right ICA appearance normalizes at the supraclinoid segment. Electronically Signed   By: Marnee SpringJonathon  Watts M.D.   On: 04/05/2016 12:03   Dg Chest Port 1 View  Result Date: 04/19/2016 CLINICAL DATA:  Respiratory failure, history COPD, coronary artery disease EXAM: PORTABLE CHEST 1 VIEW COMPARISON:  Portable exam 0602 hours compared to 04/19/2016 FINDINGS: Endotracheal tube, nasogastric tube, and RIGHT jugular line stable. Normal heart size, mediastinal contours, and pulmonary vascularity. Atherosclerotic calcification aorta. Scattered BILATERAL pulmonary infiltrates with underlying emphysematous changes. Greatest infiltrates are located in LEFT upper lobe and RIGHT lower lobe. LEFT pleural effusion again seen. No pneumothorax. Bones demineralized. IMPRESSION: COPD changes with persistent BILATERAL pulmonary infiltrates and LEFT pleural effusion. Aortic atherosclerosis. Electronically Signed   By: Ulyses SouthwardMark  Boles M.D.   On: 04/14/2016 07:55   Dg Chest Port 1 View  Result Date: 04/19/2016 CLINICAL DATA:  Respiratory failure. EXAM: PORTABLE CHEST 1 VIEW COMPARISON:  CT 04/17/2016.  Chest x-ray 04/17/2016. FINDINGS: Endotracheal tube, NG tube, right IJ line in stable position. Multifocal bilateral pulmonary infiltrates and small bilateral pleural effusions again noted. Heart size stable. No pneumothorax. IMPRESSION: 1. Lines and tubes in stable position. 2. Multifocal bilateral pulmonary infiltrates with small bilateral pleural effusions again noted. No significant change from prior CT of 04/17/2016. Electronically Signed   By: Maisie Fushomas  Register   On: 04/19/2016 07:07    Medications:  I have reviewed the patient's current medications.  Scheduled: . aspirin  81 mg Per Tube Daily  . mouth rinse  15 mL Mouth Rinse  10 times per day  . sodium chloride flush  10-40 mL Intracatheter Q12H    Assessment/Plan: Patient poorly responsive. MRI of the brain reviewed and shows small bilateral hemispheric acute infarcts.  Although this complicates the clincal picture and may in and of itself cause some disability, this is not likely the most clinically relevant issue for this patient.  Patient is not brain dead but will likely have a prolonged neurologic recovery that will not be complete.  Pulmonary issues remain paramount and will likely more significantly affect recovery than neurologic issues.   Conversations with family today from chart describe no escalation of care at this time and will therefore not order further work up such as echocardiogram, etc.  Will continue to follow with you.   Agree with ASA.     LOS: 7 days   Thana Farr, MD Neurology [phone] 04/10/2016  3:16 PM

## 2016-04-27 NOTE — Progress Notes (Signed)
Pt actively dying. CH Alinda MoneyMelvin introduced me as [employer] Cha[employer] to family and Dr. Melvenia BeamSimon. CH is avaialbe as needed and will check back regularly.   12/30/15 1400  Clinical Encounter Type  Visited With Family  Visit Type Patient actively dying  Referral From Chaplain  Spiritual Encounters  Spiritual Needs Emotional  Stress Factors  Patient Stress Factors None identified  Family Stress Factors Loss

## 2016-04-27 NOTE — Progress Notes (Signed)
Patient is placed on high fowlers position, cuff deflated, suctioned orally and endotracheally and then extubated to room air 

## 2016-04-27 NOTE — Progress Notes (Signed)
Patient extubated, comfort care initiated.

## 2016-04-27 NOTE — Progress Notes (Addendum)
PULMONARY / CRITICAL CARE MEDICINE   Name: Keith Watts MRN: [id number] DOB: 30-Aug-1940    ADMISSION DATE:  06-May-2016 CONSULTATION DATE:  04/14/2016  REFERRING MD:  Dr. Elisabeth Pigeon  Patient profile: 16 M with severe COPD, chronic O2 dependence, Diastolic CHF, presented to [facility] ER on 10/18 and admitted to the [facility] unit. On 10/19  rapid response was initiated for unresponsiveness. Therefore, intubated and transferred to ICU where PCCM assumed care.  MAJOR EVENTS/TEST RESULTS: 10/18 CT head: No acute intracranial abnormality.  10/19 TTE: The cavity size was moderately dilated. Mild-mod AS. Estimated EF 70%. RV dilatation  10/20 CT head: No acute intracranial abnormality 10/20 Cardiology consultation 10/21 Neurology consultation. Doubt seizures. EEG ordered. Continue Keppra. Low dose midaz infusion ordered 10/22 CT chest: Multifocal infiltrates and bilateral pleural effusions. Mediastinal adenopathy most likely reactive in the setting of infiltrates. No PE 10/22 LE venous US: no DVT 10/23 Midaz infusion D/C'd. Remains on norepinephrine infusion 10/23 EEG: diffuse slowing, nonspecific 10/23 ACTH stim test normal 10/24 Remains on NE infusion. Increasing O2 reqt. Vent adjustments made. Resp culture ordered 10/24 Wife updated @ bedisde. She wishes to have family meeting 10/25 @ 1230 10/25 Anasarca - Furosemide ordered. Remains minimally responsive despite no sedation X 48 hrs.  10/25 MRI brain:   INDWELLING DEVICES: ETT 10/19 >>  Right IJ 10/19 >>   MICRO DATA: Blood 10/18 >> NEG MRSA PCR 10/19 >> NEG Urine 10/19 >> NEG Resp 10/19 >> normal flora Resp 10/24 >>   ANTIMICROBIALS:  Azithro 10/18 >> 10/19 Ceftriaxone 10/18 >> 10/19 Vanc 10/19 >> 10/20 Pp-tazo 10/19 >>    SUBJECTIVE: Patient does not follow any commands, is not  receiving any sedation.  Is on levophed . Had a low grade temp. Overnight.  VITAL SIGNS: BP (!) 117/59   Pulse (!) 110   Temp 99.1 F  (37.3 C) (Oral)   Resp (!) 22   Ht 5\' 6"  (1.676 m)   Wt 70.8 kg (156 lb 1.4 oz)   SpO2 92%   BMI 25.19 kg/m   HEMODYNAMICS:    VENTILATOR SETTINGS: Vent Mode: PRVC FiO2 (%):  [55 %-65 %] 60 % Set Rate:  [15 bmp] 15 bmp Vt Set:  [500 mL] 500 mL PEEP:  [5 cmH20-8 cmH20] 8 cmH20 Plateau Pressure:  [22 cmH20-23 cmH20] 22 cmH20  INTAKE / OUTPUT: I/O last 3 completed shifts: In: 3710.9 [I.V.:991.7; NG/GT:2099.2; IV Piggyback:620] Out: 2835 [Urine:2835]  PHYSICAL EXAMINATION: General: RASS -4. Not F/C Neuro:  PERRL, No spontaneous movement, DTRs symmetric HEENT:  Supple, no JVD noted Cardiovascular: reg, no MRG noted Lungs: coarse rhonchi, no wheezes, crackles or rhonchi noted Abdomen: soft, + BS, no overt tenderness, severe scrotal and penile edema Ext: 3-4+ UE edema, 2-3+ ankle/pretibial edema  LABS:  BMET  Recent Labs Lab 04/18/16 0448 04/19/16 0552 04/25/2016 0419  NA 144 148* 145  K 4.2 4.1 4.2  CL 104 105 101  CO2 34* 35* 37*  BUN 55* 59* 60*  CREATININE 1.93* 1.98* 1.97*  GLUCOSE 192* 147* 147*    Electrolytes  Recent Labs Lab 04/14/16 1103 04/15/16 0508 04/15/16 1736  04/18/16 0448 04/19/16 0552 04/19/2016 0419  CALCIUM 7.8* 7.6* 7.7*  < > 7.9* 8.5* 8.9  MG 2.3 2.0 2.1  --  2.4  --   --   PHOS 5.8* 3.6  --   --  3.9  --   --   < > = values in this interval not displayed.  CBC  Recent  Labs Lab 04/18/16 0448 04/19/16 0552 03/28/2016 0419  WBC 8.9 9.2 8.7  HGB 9.0* 8.8* 8.5*  HCT 27.7* 27.5* 26.5*  PLT 91* 101* 118*    Coag's  Recent Labs Lab 03/07/2016 1332 04/15/16 1108  APTT 29  --   INR 1.05 1.29    Sepsis Markers  Recent Labs Lab 03/07/2016 1332  04/14/16 1103 04/14/16 1713 04/15/16 0508 04/16/16 0454 04/19/16 0552  LATICACIDVEN 1.9  --  2.3* 1.1  --   --   --   PROCALCITON  --   < > 0.16  --  0.34 0.38 0.71  < > = values in this interval not displayed.  ABG  Recent Labs Lab 04/14/16 1756 04/15/16 0412  04/17/16 0443  PHART 7.34* 7.31* 7.33*  PCO2ART 76* 80* 73*  PO2ART 56* 84 86    Liver Enzymes  Recent Labs Lab 03/07/2016 1332 04/14/16 1103 04/19/16 0552  AST 29 33 30  ALT 19 17 18   ALKPHOS 85 87 84  BILITOT 0.8 1.0 0.6  ALBUMIN 3.3* 2.9* 1.9*    Cardiac Enzymes  Recent Labs Lab 03/07/2016 1827 04/14/16 1103 04/14/16 1713  TROPONINI 0.05* 0.05* 0.11*    Glucose  Recent Labs Lab 04/19/16 0104 04/19/16 0415 04/19/16 0717 04/19/16 1128 04/19/16 1604 04/03/2016 0011  GLUCAP 135* 131* 144* 152* 148* 135*    CXR: Increased bilateral patchy infiltrates   ASSESSMENT / PLAN:  PULMONARY A: 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 Severe baseline COPD with chronic O2 dependence HCAP, NOS - worsening infiltrates on CXR P:   Cont full vent support  Cont vent bundle Daily SBT if/when meets criteria reculture resp tract  CARDIOVASCULAR A:  Shock of unclear etiology  CHF with preserved EF Cor pulmonale  H/O CAD P:  Wean NE to off for MAP > 65 mmHg Monitor hemodynamics  RENAL A:   AKI, nonooliguric CKD Hypernatremia-resolved Anasarca P:   Monitor BMET intermittently Monitor I/Os Correct electrolytes as indicated Furosemide X 2 ordered. Recheck BMET this afternoon  GASTROINTESTINAL A:   No acute issues P:   Continue famotidine for GIP Cont TFs  HEMATOLOGIC A:   Anemia without acute blood loss P:  DVT px: SQ heparin Monitor CBC intermittently Transfuse per usual guidelines  INFECTIOUS A:   PNA,multilobar P:   Monitor fever curve Follow cultures Continue zosyn  ENDOCRINE A:   Mild hyperglycemia  P:   CBGs q 8 hrs Consider resuming SSI if glu > 180  NEUROLOGIC A:   Acute encephalopathy of critical illness Coma - etiology unclear P:   RASS goal: 0, -1 Cont PRN fentanyl, lorazepam MRI brain today Neurology to see again today  Family meeting @ 1230 today  CCM time: 35 mins The above time includes time  spent in consultation with patient and/or family members and reviewing care plan on multidisciplinary rounds  Billy Fischeravid Malyia Moro, MD PCCM service Mobile [phone] Pager [phone]

## 2016-04-27 NOTE — Significant Event (Signed)
I had a detailed discussion with Mr Keith Watts's extended family and we discussed his prior health status and QOL, his current critical illness and his likelihood of a recovery that would be satisfactory to him. I reported the new finding of CVAs on the MRI noting that these were small and in and of themselves would not be a reason to stop our support. However, we considered the larger context of multi-organ system failure and his well defined advanced directives. I offered that it would be medically acceptable to stop now or alternatively to continue our support for 2-3 days at most. After I left the family, they considered all of these factors and his wife approached me requesting that we provide full comfort care and discontinuation of life-sustaining therapies. Withdrawal protocol ordered  [name], MD PCCM service Mobile [phone] Pager [phone] 04/16/2016

## 2016-04-27 DEATH — deceased

## 2016-05-27 NOTE — Discharge Summary (Addendum)
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 Chronic respiratory failure on 02 Hyperkalemia Lactic acidosis   DISCHARGE DIAGNOSES:   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 Severe baseline COPD with chronic O2 dependence HCAP, NOS Shock of unclear etiology  CHF with preserved EF Cor pulmonale  H/O CAD AKI, nonooliguric CKD Hypernatremia-resolved Anasarca Anemia without acute blood loss PNA, multilobar Mild hyperglycemia  Acute encephalopathy of critical illness Coma - etiology unclear Bilateral acute CVAs  PRESENTATION:   Pt was admitted with the following HPI and the above admission diagnoses:  This is a 75 yo male with a PMH of Pneumonia, Diastolic dysfunction (Echo 09/2014 EF 50-55%), COPD, Former smoker (1 to 1.5 PPD for 40 yrs.), Chronic O2 3L via nasal cannula, CAD, PCI 2016, BPH, and Allergic Rhinitis.  [facility]  Per ER notes the pts daughter and wife stated the pt developed a productive cough, poor oral intake, multiple falls and generalized weakness for about 3-4 days prompting his visit to the ER.  According the pts family his home O2 was increased from 3L to 4L per pulmonologist Dr. Meredeth IdeFleming 1 week ago due to pt c/o shortness of breath. According to the family the pt has been diagnosed with pneumonia multiple times within the past year. The pt has had choking episodes with water and food intake as well.  On 10/19 a rapid response was initiated upon rapid response nurses arrival it was noted the pt was not responsive to sternal rub and was hypotensive, therefore pt was transferred to ICU and intubated.  PCCM  consulted for acute on chronic hypoxic hypercapnic respiratory failure secondary to bilateral lower lobe pneumonia and AECOPD, pulmonary edema requiring mechanical intubation and septic shock requiring pressors.  HOSPITAL COURSE:   MAJOR EVENTS/TEST RESULTS: 10/18 CT head: No acute intracranial abnormality.  10/19 TTE: The cavity size was moderately dilated. Mild-mod AS. Estimated EF 70%. RV dilatation  10/20 CT head: No acute intracranial abnormality 10/20 Cardiology consultation 10/21 Neurology consultation. Doubt seizures. EEG ordered. Continue Keppra. Low dose midaz infusion ordered 10/22 CT chest: Multifocal infiltrates and bilateral pleural effusions. Mediastinal adenopathy most likely reactive in the setting of infiltrates. No PE 10/22 LE venous US: no DVT 10/23 Midaz infusion D/C'd. Remains on norepinephrine infusion 10/23 EEG: diffuse slowing, nonspecific 10/23 ACTH stim test normal 10/24 Remains on NE infusion. Increasing O2 reqt. Vent adjustments made. Resp culture ordered 10/24 Wife updated @ bedisde. She wishes to have family meeting 10/25 @ 1230 10/25 Anasarca - Furosemide ordered. Remains minimally responsive despite no sedation X 48 hrs.  10/25 MRI brain: Small acute infarcts in the bilateral cerebral white matter which could be embolic or deep watershed. Asymmetric ischemic gliosis in the right cerebral white matter likely related to chronic right ICA occlusion 10/25 Family conference: the following was documented by Dr Sung AmabileSimonds - I had detailed discussion with Mr Auxier's extended family and we discussed his prior health status and QOL, his current critical illness and his likelihood of a recovery that would be satisfactory to him. I reported the new finding of CVAs on the MRI noting that these were small and in and of themselves would not be a reason to stop our support.  However, we considered the larger context of multi-organ system failure and his well defined advanced  directives. I offered that it would be medically acceptable to stop now or alternatively to continue our support for 2-3 days at most. After I left the family, they considered all of these factors and his wife approached me requesting that we provide full comfort care and discontinuation of life-sustaining therapies. Withdrawal protocol ordered.   INDWELLING DEVICES: ETT 10/19 >>  Right IJ 10/19 >>   MICRO DATA: Blood 10/18 >> NEG MRSA PCR 10/19 >> NEG Urine 10/19 >> NEG Resp 10/19 >> normal flora Resp 10/24 >>   ANTIMICROBIALS:  Azithro 10/18 >> 10/19 Ceftriaxone 10/18 >> 10/19 Vanc 10/19 >> 10/20 Pp-tazo 10/19 >> 10/25   Cause of death: Acute on chronic respiratory failure due to multilobar pneumonia superimposed on severe COPD  Contributing factors: Persistent shock, AKI, acute encephalopathy, bilateral acute CVAs  Autopsy: No  Smoking: Yes   Billy Fischeravid Rheanna Sergent, MD PCCM service Mobile [phone] Pager [phone] 05/03/2016

## 2016-10-28 IMAGING — CT CT CHEST W/O CM
1 series · 13 of 34 positions shown, 17 images · non-contrast
Comparison: Chest CT 12/01/2015.

CLINICAL DATA: 75-year-old male with prior abnormal chest CT
suspicious for pneumonia. Patient has been on antibiotics since the
prior examination. Followup study to ensure resolution.

EXAM:
CT CHEST WITHOUT CONTRAST
TECHNIQUE: Multidetector CT imaging of the chest was performed following the
standard protocol without IV contrast.

[Series 2: thorax · axial · 0.78mm/px · z∈[-541,-285]mm · 13 of 152 slices shown, 17 images]
[im 12/152  mediastinal]
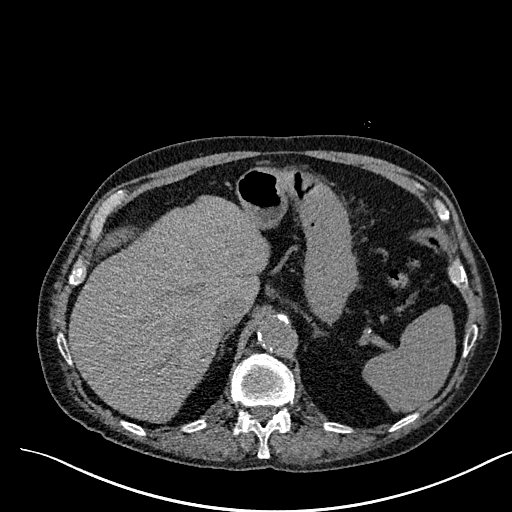
[im 12/152  lung]
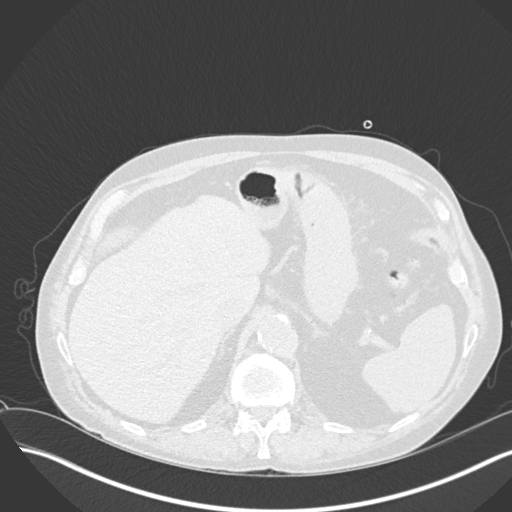
[im 23/152  lung]
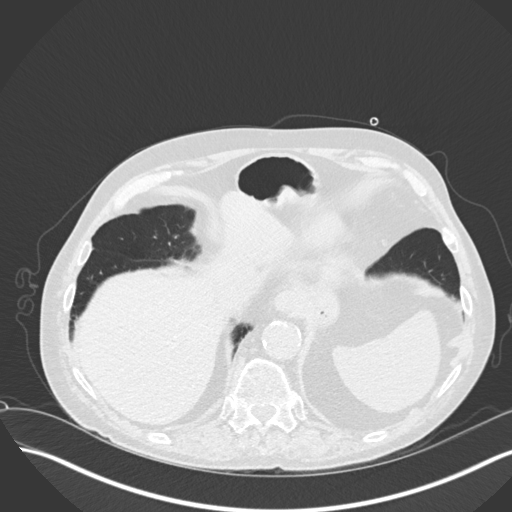
[im 34/152  lung]
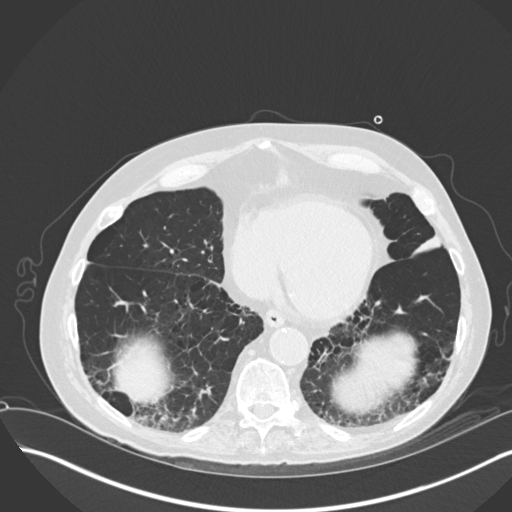
[im 45/152  lung]
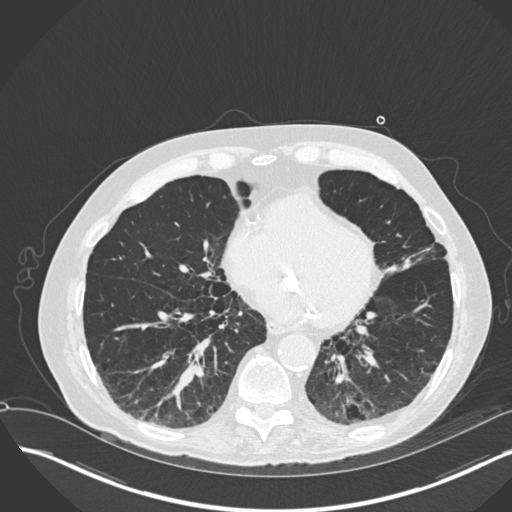
[im 61/152  mediastinal]
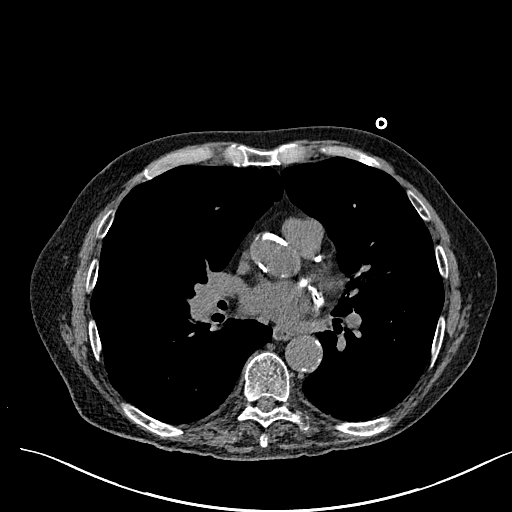
[im 61/152  lung]
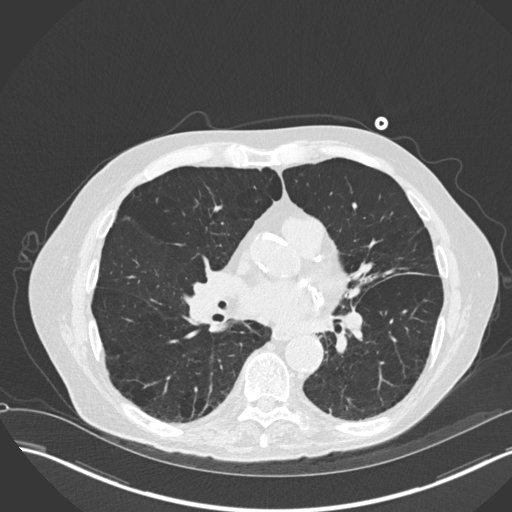
[im 68/152  lung]
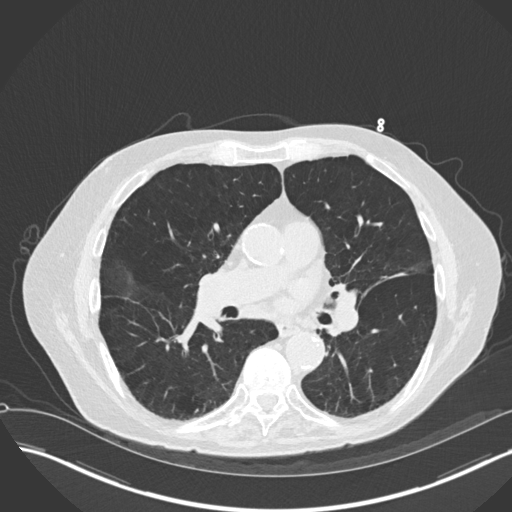
[im 79/152  lung]
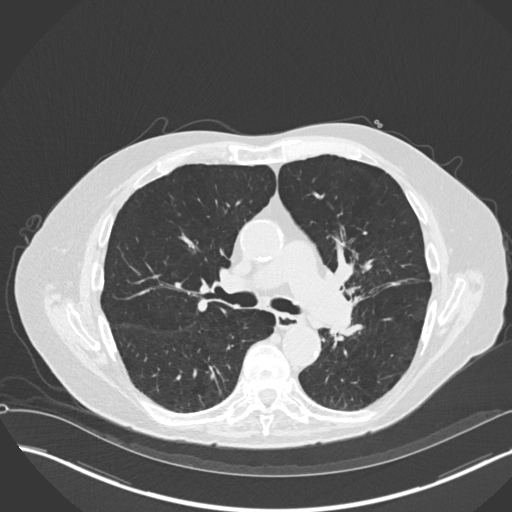
[im 84/152  lung]
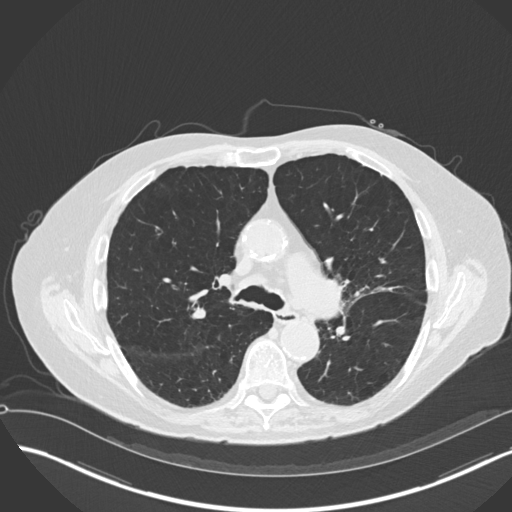
[im 91/152  mediastinal]
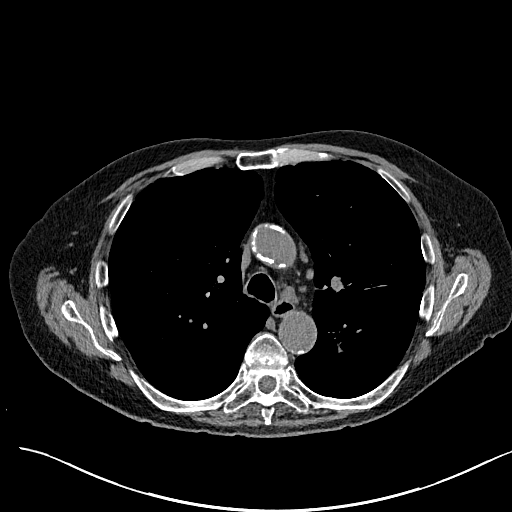
[im 91/152  lung]
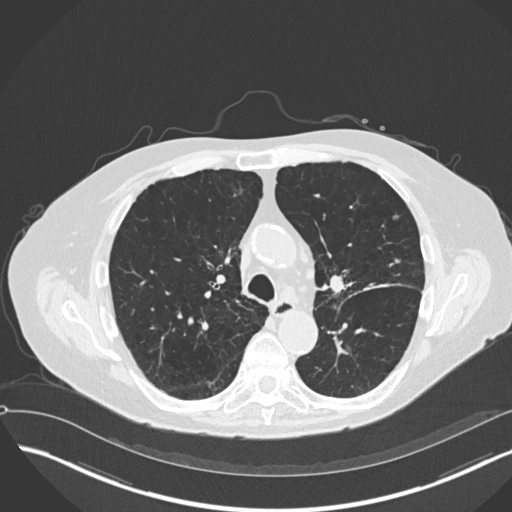
[im 107/152  lung]
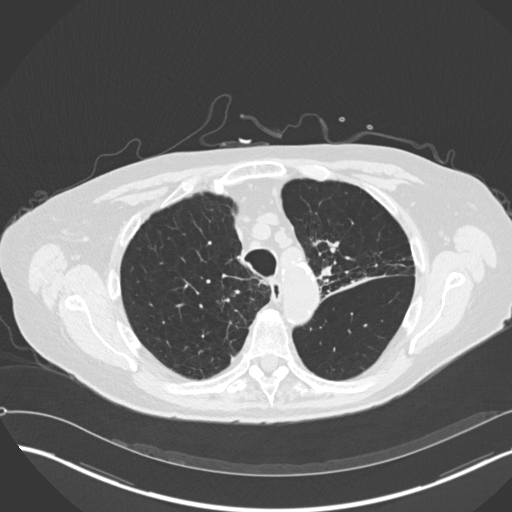
[im 118/152  lung]
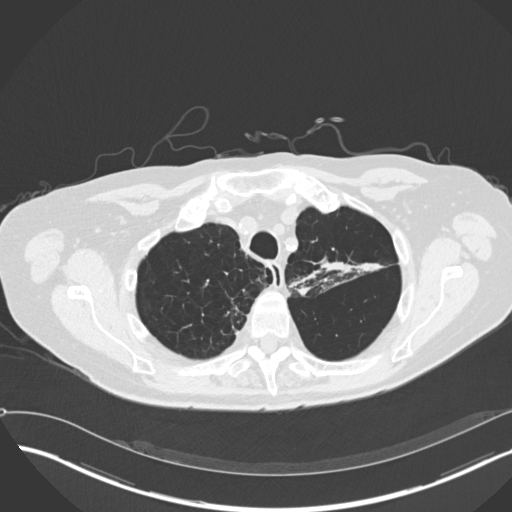
[im 129/152  lung]
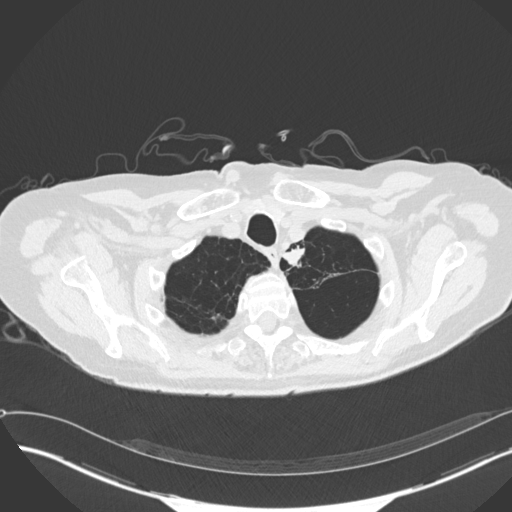
[im 140/152  mediastinal]
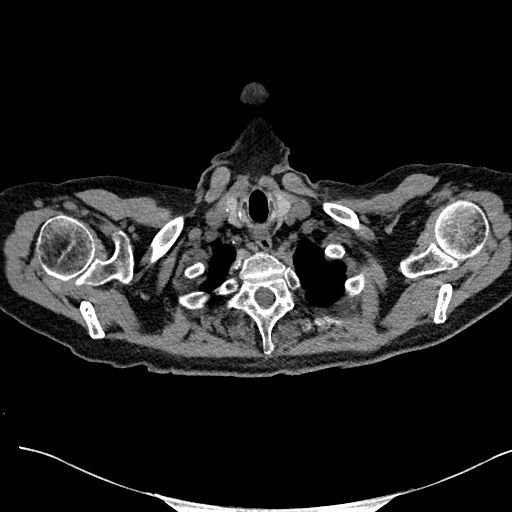
[im 140/152  lung]
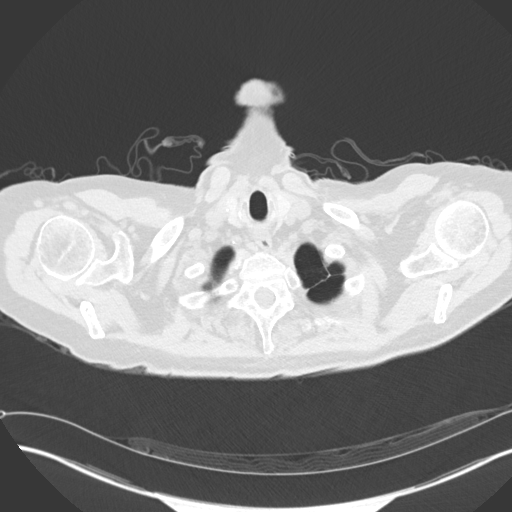

[13 of 34 positions shown; findings below may reference images not displayed]

FINDINGS: Mediastinum/Lymph Nodes: Heart size is normal. There is no
significant pericardial fluid, thickening or pericardial
calcification. There is atherosclerosis of the thoracic aorta, the
great vessels of the mediastinum and the coronary arteries,
including calcified atherosclerotic plaque in the left main, left
anterior descending, left circumflex and right coronary arteries.
Severe calcifications of the aortic valve and mitral annulus. Mildly
enlarged low left paratracheal lymph node measuring 1 cm in short
axis. Multiple other borderline enlarged mediastinal lymph nodes.
Please note that accurate exclusion of hilar adenopathy is limited
on noncontrast CT scans. Esophagus is unremarkable in appearance. No
axillary lymphadenopathy.

Lungs/Pleura: There is again an elongated nodular opacity in the
left upper lobe abutting the major fissure best appreciated on axial
image 35 of series 3 and coronal image 65 of series 5. This lesion
is very irregular in shape and therefore difficult to accurately
measure, but is estimated to measure approximately 5.0 x 0.7 x
cm. There is surrounding architectural distortion and septal
thickening posterior and medial to the lesion. In the medial aspect
of the left upper lobe there is a 1.2 x 1.9 x 2.8 cm new nodular
density (axial image 23 of series 3 and coronal image 60 of series
5) which is relatively high attenuation (60 HU), presumably a new
area of infectious consolidation. Diffuse bronchial wall thickening
with severe centrilobular emphysema. Throughout the visualized lung
bases there also areas of septal thickening with some scattered
bronchiectasis and peripheral bronchiolectasis, as well as some mild
ground-glass attenuation. There maybe early honeycombing in the left
lower lobe (image 121 of series 3), however, this is difficult to
assess given the background of emphysema and extensive
bronchiectatic changes in this region. No pleural effusions.

Upper Abdomen: Atherosclerosis.

Musculoskeletal/Soft Tissues: There are no aggressive appearing
lytic or blastic lesions noted in the visualized portions of the
skeleton.
IMPRESSION: 1. Advanced bullous emphysema redemonstrated. The previously noted
unusual elongated nodular density in the left upper lobe is very
similar to the prior study, and remains indeterminate. However,
there is a new 1.2 x 1.9 x 2.8 cm nodular density in the medial
aspect of the left upper lobe (as above) which has developed since
the prior study from only 9 days ago, presumably a new area of
infectious consolidation. Atypical infection, including fungal
disease, is suspected as an etiology for all of these findings in
the left upper lobe. Consideration for bronchoscopy to establish a
causative organism for treatment is suggested if clinically
appropriate.
2. Atherosclerosis, including left main and 3 vessel coronary artery
disease. Please Assessment for potential risk factor modification,
dietary therapy or pharmacologic therapy may be warranted, if
clinically indicated.
3. There are calcifications of the aortic valve and mitral annulus.
Echocardiographic correlation for evaluation of potential valvular
dysfunction may be warranted if clinically indicated.
